# Patient Record
Sex: Male | Born: 1960 | Race: Black or African American | Hispanic: No | Smoking: Former smoker
Health system: Southern US, Community
[De-identification: ages and names within clinical notes are randomized; demographics above are authoritative.]

## PROBLEM LIST (undated history)

## (undated) DIAGNOSIS — J329 Chronic sinusitis, unspecified: Secondary | ICD-10-CM

## (undated) DIAGNOSIS — E119 Type 2 diabetes mellitus without complications: Secondary | ICD-10-CM

## (undated) DIAGNOSIS — I10 Essential (primary) hypertension: Secondary | ICD-10-CM

## (undated) DIAGNOSIS — E291 Testicular hypofunction: Secondary | ICD-10-CM

## (undated) DIAGNOSIS — J309 Allergic rhinitis, unspecified: Secondary | ICD-10-CM

## (undated) DIAGNOSIS — T7840XA Allergy, unspecified, initial encounter: Secondary | ICD-10-CM

## (undated) DIAGNOSIS — E785 Hyperlipidemia, unspecified: Secondary | ICD-10-CM

## (undated) DIAGNOSIS — S37019A Minor contusion of unspecified kidney, initial encounter: Secondary | ICD-10-CM

## (undated) HISTORY — DX: Essential (primary) hypertension: I10

## (undated) HISTORY — PX: WISDOM TOOTH EXTRACTION: SHX21

## (undated) HISTORY — DX: Chronic sinusitis, unspecified: J32.9

## (undated) HISTORY — PX: ROTATOR CUFF REPAIR: SHX139

## (undated) HISTORY — DX: Minor contusion of unspecified kidney, initial encounter: S37.019A

## (undated) HISTORY — DX: Testicular hypofunction: E29.1

## (undated) HISTORY — PX: HERNIA REPAIR: SHX51

## (undated) HISTORY — DX: Hyperlipidemia, unspecified: E78.5

## (undated) HISTORY — DX: Allergy, unspecified, initial encounter: T78.40XA

## (undated) HISTORY — PX: COLONOSCOPY: SHX174

## (undated) HISTORY — DX: Type 2 diabetes mellitus without complications: E11.9

## (undated) HISTORY — DX: Allergic rhinitis, unspecified: J30.9

---

## 1999-09-08 ENCOUNTER — Encounter: Payer: Self-pay | Admitting: Internal Medicine

## 1999-09-08 ENCOUNTER — Ambulatory Visit (HOSPITAL_COMMUNITY): Admission: RE | Admit: 1999-09-08 | Discharge: 1999-09-08 | Payer: Self-pay | Admitting: Family Medicine

## 2001-10-16 ENCOUNTER — Encounter: Payer: Self-pay | Admitting: Internal Medicine

## 2001-10-16 ENCOUNTER — Ambulatory Visit (HOSPITAL_COMMUNITY): Admission: RE | Admit: 2001-10-16 | Discharge: 2001-10-16 | Payer: Self-pay | Admitting: Internal Medicine

## 2004-02-28 ENCOUNTER — Ambulatory Visit (HOSPITAL_COMMUNITY): Admission: RE | Admit: 2004-02-28 | Discharge: 2004-02-28 | Payer: Self-pay | Admitting: Internal Medicine

## 2006-05-10 LAB — HM COLONOSCOPY

## 2008-02-11 ENCOUNTER — Ambulatory Visit (HOSPITAL_COMMUNITY): Admission: RE | Admit: 2008-02-11 | Discharge: 2008-02-11 | Payer: Self-pay | Admitting: Internal Medicine

## 2010-03-09 ENCOUNTER — Ambulatory Visit (HOSPITAL_COMMUNITY): Admission: RE | Admit: 2010-03-09 | Discharge: 2010-03-09 | Payer: Self-pay | Admitting: Internal Medicine

## 2010-03-16 ENCOUNTER — Encounter: Admission: RE | Admit: 2010-03-16 | Discharge: 2010-03-16 | Payer: Self-pay | Admitting: Internal Medicine

## 2010-05-04 ENCOUNTER — Ambulatory Visit (HOSPITAL_COMMUNITY): Admission: RE | Admit: 2010-05-04 | Discharge: 2010-05-04 | Payer: Self-pay | Admitting: Internal Medicine

## 2011-11-21 ENCOUNTER — Other Ambulatory Visit (HOSPITAL_COMMUNITY): Payer: Self-pay | Admitting: Internal Medicine

## 2011-11-21 ENCOUNTER — Ambulatory Visit (HOSPITAL_COMMUNITY)
Admission: RE | Admit: 2011-11-21 | Discharge: 2011-11-21 | Disposition: A | Discharge status: Home / Self Care | Payer: BC Managed Care – PPO | Source: Ambulatory Visit | Attending: Internal Medicine | Admitting: Internal Medicine

## 2011-11-21 DIAGNOSIS — R509 Fever, unspecified: Secondary | ICD-10-CM

## 2011-11-21 DIAGNOSIS — I10 Essential (primary) hypertension: Secondary | ICD-10-CM | POA: Insufficient documentation

## 2011-11-21 DIAGNOSIS — R05 Cough: Secondary | ICD-10-CM | POA: Insufficient documentation

## 2011-11-21 DIAGNOSIS — R059 Cough, unspecified: Secondary | ICD-10-CM | POA: Insufficient documentation

## 2012-10-08 ENCOUNTER — Encounter: Payer: Self-pay | Admitting: *Deleted

## 2012-10-09 ENCOUNTER — Ambulatory Visit (INDEPENDENT_AMBULATORY_CARE_PROVIDER_SITE_OTHER): Payer: BC Managed Care – PPO | Admitting: Internal Medicine

## 2012-10-09 ENCOUNTER — Encounter: Payer: Self-pay | Admitting: Internal Medicine

## 2012-10-09 ENCOUNTER — Other Ambulatory Visit: Payer: BC Managed Care – PPO

## 2012-10-09 VITALS — BP 110/74 | HR 71 | Ht 70.0 in | Wt 259.0 lb

## 2012-10-09 DIAGNOSIS — J309 Allergic rhinitis, unspecified: Secondary | ICD-10-CM

## 2012-10-09 DIAGNOSIS — G4733 Obstructive sleep apnea (adult) (pediatric): Secondary | ICD-10-CM

## 2012-10-09 NOTE — Patient Instructions (Addendum)
Order - lab- Allergy profile  Dx allergic rhinitis  Sample Dymista nasal spray-  1 or 2 puffs each nostril once daily at bedtime. Try this instead of Omnaris for a while for comparison  Ask your wife to notice if, with the snoring, you also seem to slow down your breathing or hold your breath, which might indicate sleep apnea

## 2012-10-09 NOTE — Progress Notes (Signed)
10/09/12- 51 yoM former smoker referred for allergy consultation, courtesy of Dr Oneta Rack He had been referred to ENT, Dr Narda Bonds, who prescribed Omnaris, Neti pot and suggested Allegra.. has had eustachian dysfunction for many years. Perennial nasal congestion and postnasal drainage. Occasionally nose burns and he will sneeze. Uses Afrin at bedtime. ENT surgery only for wisdom teeth. Eating and temperature changes cause watery rhinorrhea and salivation. Loud snoring. Feels better when he is closer to the beach. Lots of sinus congestion and drainage. Sputum is clear in color. Quit smoking 2008. 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  Prior to Admission medications   Medication Sig Start Date End Date Taking? Authorizing Provider  aspirin 81 MG tablet Take 81 mg by mouth daily.   Yes Historical Provider, MD  bisoprolol-hydrochlorothiazide (ZIAC) 5-6.25 MG per tablet Take 1 tablet by mouth daily.   Yes Historical Provider, MD  ezetimibe (ZETIA) 10 MG tablet Take 10 mg by mouth daily.   Yes Historical Provider, MD  glipiZIDE (GLUCOTROL) 5 MG tablet Take 5 mg by mouth 2 (two) times daily before a meal.   Yes Historical Provider, MD  insulin detemir (LEVEMIR) 100 UNIT/ML injection Inject 15 Units into the skin at bedtime.   Yes Historical Provider, MD  metFORMIN (GLUCOPHAGE) 500 MG tablet Take 1,000 mg by mouth 2 (two) times daily with a meal.   Yes Historical Provider, MD   Earl Lagos Past Surgical History  Procedure Date  . Wisdom tooth extraction   . Hernia repair    Family History  Problem Relation Age of Onset  . Diabetes Mother   . Hypertension Mother   . Hyperlipidemia Mother   . Diabetes Father   . Hypertension Father   . Hyperlipidemia Father    History   Social History  . Marital Status: Divorced    Spouse Name: N/A    Number of Children: N/A  . Years of Education: N/A   Occupational History  . Not on file.   Social History Main  Topics  . Smoking status: Former Smoker -- 0.3 packs/day for 8 years    Types: Cigarettes  . Smokeless tobacco: Not on file  . Alcohol Use: Yes     Comment: Special Occassions  . Drug Use: No  . Sexually Active: Not on file   Other Topics Concern  . Not on file   Social History Narrative  . No narrative on file   ROS-see HPI Constitutional:   No-   weight loss, night sweats, fevers, chills, fatigue, lassitude. HEENT:   No-  headaches, difficulty swallowing, tooth/dental problems, sore throat,       +  sneezing, itching, ear ache, +nasal congestion, post nasal drip,  CV:  No-   chest pain, orthopnea, PND, swelling in lower extremities, anasarca, dizziness, palpitations Resp: No-   shortness of breath with exertion or at rest.              No-   productive cough,  No non-productive cough,  No- coughing up of blood.              No-   change in color of mucus.  No- wheezing.   Skin: No-   rash or lesions. GI:  No-   heartburn, indigestion, abdominal pain, nausea, vomiting, diarrhea,                 change in bowel habits, loss of appetite GU: No-   dysuria, change  in color of urine, no urgency or frequency.  No- flank pain. MS:  No-   joint pain or swelling.  No- decreased range of motion.  No- back pain. Neuro-     nothing unusual Psych:  No- change in mood or affect. No depression or anxiety.  No memory loss.  OBJ- Physical Exam General- Alert, Oriented, Affect-appropriate, Distress- none acute Skin- rash-none, lesions- none, excoriation- none Lymphadenopathy- none Head- atraumatic            Eyes- Gross vision intact, PERRLA, conjunctivae and secretions clear            Ears- Hearing, canals-normal            Nose- + mucus, no-Septal dev,  polyps, erosion, perforation             Throat- Mallampati II-III , mucosa clear , drainage- none, tonsils- atrophic Neck- flexible , trachea midline, no stridor , thyroid nl, carotid no bruit Chest - symmetrical excursion , unlabored            Heart/CV- RRR , no murmur , no gallop  , no rub, nl s1 s2                           - JVD- none , edema- none, stasis changes- none, varices- none           Lung- clear to P&A, wheeze- none, cough- none , dullness-none, rub- none           Chest wall-  Abd- tender-no, distended-no, bowel sounds-present, HSM- no Br/ Gen/ Rectal- Not done, not indicated Extrem- cyanosis- none, clubbing, none, atrophy- none, strength- nl Neuro- grossly intact to observation

## 2012-10-10 LAB — ALLERGY FULL PROFILE
Allergen, D pternoyssinus,d7: <0.1 kU/L
Allergen,Goose feathers, e70: <0.1 kU/L
Alternaria Alternata: <0.1 kU/L
Aspergillus fumigatus, IgG: <0.1 kU/L
Bahia Grass: <0.1 kU/L
Bermuda Grass: <0.1 kU/L
Box Elder IgE: <0.1 kU/L
Candida Albicans: <0.1 kU/L
Cat Dander: <0.1 kU/L
Common Ragweed: <0.1 kU/L
Curvularia lunata: <0.1 kU/L
D. farinae: <0.1 kU/L
Dog Dander: <0.1 kU/L
Elm IgE: <0.1 kU/L
Fescue: <0.1 kU/L
G005 Rye, Perennial: <0.1 kU/L
G009 Red Top: <0.1 kU/L
Goldenrod: <0.1 kU/L
Helminthosporium halodes: <0.1 kU/L
House Dust Hollister: <0.1 kU/L
IgE (Immunoglobulin E), Serum: 16.5 IU/mL (ref 0.0–180.0)
Lamb's Quarters: <0.1 kU/L
Oak: <0.1 kU/L
Plantain: <0.1 kU/L
Stemphylium Botryosum: <0.1 kU/L
Sycamore Tree: <0.1 kU/L
Timothy Grass: <0.1 kU/L

## 2012-10-19 DIAGNOSIS — J310 Chronic rhinitis: Secondary | ICD-10-CM | POA: Insufficient documentation

## 2012-10-19 DIAGNOSIS — G4733 Obstructive sleep apnea (adult) (pediatric): Secondary | ICD-10-CM | POA: Insufficient documentation

## 2012-10-19 NOTE — Assessment & Plan Note (Addendum)
Not clear if this is allergic or nonallergic/vasomotor Plan-allergy profile, sample of Dymista nasal spray

## 2012-10-19 NOTE — Assessment & Plan Note (Signed)
Family reports loud snoring. He feels daytime sleepiness only if sugar is low. Plan-he is going to discuss with his wife and asked her to a more attention to whether he is stopping breathing before we consider scheduling a sleep study.

## 2012-10-23 NOTE — Progress Notes (Signed)
Quick Note:  LMTCB ______ 

## 2012-11-05 NOTE — Progress Notes (Signed)
Quick Note:  Called pt's home # - spoke with family member. Was advised pt is not home right now. She will ask pt to call office back. ______

## 2012-11-10 ENCOUNTER — Ambulatory Visit (INDEPENDENT_AMBULATORY_CARE_PROVIDER_SITE_OTHER): Payer: BC Managed Care – PPO | Admitting: Internal Medicine

## 2012-11-10 ENCOUNTER — Encounter: Payer: Self-pay | Admitting: Internal Medicine

## 2012-11-10 VITALS — BP 124/70 | HR 70 | Ht 70.0 in | Wt 259.0 lb

## 2012-11-10 DIAGNOSIS — J310 Chronic rhinitis: Secondary | ICD-10-CM

## 2012-11-10 DIAGNOSIS — G4733 Obstructive sleep apnea (adult) (pediatric): Secondary | ICD-10-CM

## 2012-11-10 MED ORDER — AZELASTINE-FLUTICASONE 137-50 MCG/ACT NA SUSP: 1.0000 | Freq: Every day | NASAL | Status: DC

## 2012-11-10 MED ORDER — AZELASTINE-FLUTICASONE 137-50 MCG/ACT NA SUSP: 2.0000 | Freq: Every day | NASAL | Status: DC

## 2012-11-10 NOTE — Progress Notes (Signed)
10/09/12- 51 yoM former smoker referred for allergy consultation, courtesy of Dr Oneta Rack He had been referred to ENT, Dr Narda Bonds, who prescribed Omnaris, Neti pot and suggested Allegra.. has had eustachian dysfunction for many years. Perennial nasal congestion and postnasal drainage. Occasionally nose burns and he will sneeze. Uses Afrin at bedtime. ENT surgery only for wisdom teeth. Eating and temperature changes cause watery rhinorrhea and salivation. Loud snoring. Feels better when he is closer to the beach. Lots of sinus congestion and drainage. Sputum is clear in color. Quit smoking 2008. 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  11/10/12- 51 yoM former smoker referred for allergic rhinitis       PCP Dr Oneta Rack FOLLOWS FOR: used Dymista and had good relief from it. After he ran out of medication he continued to have "swelling" in his sinuses. Wife is here and says husband is a loud snorer with witnessed apnea. He falls asleep watching TV. We discussed sleep apnea. Dymista nasal spray helps nose but did not help his snoring. He elevates the head with pillows to control reflux. Previously tried on Omnaris and Nasonex. Allergy Profile 09/09/2012-total IgE 16.5, no specific elevations  ROS-see HPI Constitutional:   No-   weight loss, night sweats, fevers, chills, fatigue, lassitude. HEENT:   No-  headaches, difficulty swallowing, tooth/dental problems, sore throat,       +  sneezing, itching, ear ache, +nasal congestion, post nasal drip,  CV:  No-   chest pain, orthopnea, PND, swelling in lower extremities, anasarca, dizziness, palpitations Resp: No-   shortness of breath with exertion or at rest.              No-   productive cough,  No non-productive cough,  No- coughing up of blood.              No-   change in color of mucus.  No- wheezing.   Skin: No-   rash or lesions. GI:  No-   heartburn, indigestion, abdominal pain, nausea,  GU:. MS:  No-    joint pain or swelling.   Neuro-     nothing unusual Psych:  No- change in mood or affect. No depression or anxiety.  No memory loss.  OBJ- Physical Exam General- Alert, Oriented, Affect-appropriate, Distress- none acute Skin- rash-none, lesions- none, excoriation- none Lymphadenopathy- none Head- atraumatic            Eyes- Gross vision intact, PERRLA, conjunctivae and secretions clear            Ears- Hearing, canals-normal            Nose- + mucus bilaterally, no-Septal dev,  polyps, erosion, perforation             Throat- Mallampati II-III , mucosa clear , drainage- none, tonsils- atrophic Neck- flexible , trachea midline, no stridor , thyroid nl, carotid no bruit Chest - symmetrical excursion , unlabored           Heart/CV- RRR , no murmur , no gallop  , no rub, nl s1 s2                           - JVD- none , edema- none, stasis changes- none, varices- none           Lung- clear to P&A, wheeze- none, cough- none , dullness-none, rub- none  Chest wall-  Abd-  Br/ Gen/ Rectal- Not done, not indicated Extrem- cyanosis- none, clubbing, none, atrophy- none, strength- nl Neuro- grossly intact to observation

## 2012-11-10 NOTE — Patient Instructions (Addendum)
Script for Dymista nasal spray    1-2 puffs each nostril once daily at bedtime     If insurance won't cover this, let us know.   [facility]  [facility]ended sleep [facility]   Dx OSA

## 2012-11-19 NOTE — Progress Notes (Signed)
Quick Note:  Spoke with pt and notified of results per Dr. Young. Pt verbalized understanding and denied any questions.  ______ 

## 2012-11-20 NOTE — Assessment & Plan Note (Signed)
There are probably vasomotor and infection components. With associated eustachian dysfunction. Plan-treat symptomatically. Encouraged scarf over face in very cold winter weather. Dymista

## 2012-11-20 NOTE — Assessment & Plan Note (Signed)
Symptoms and body habitus suggests high probability of obstructive sleep apnea. Plan-schedule unattended home sleep study.

## 2012-12-26 ENCOUNTER — Encounter: Payer: Self-pay | Admitting: Internal Medicine

## 2012-12-26 ENCOUNTER — Encounter: Payer: Self-pay | Admitting: *Deleted

## 2012-12-26 ENCOUNTER — Ambulatory Visit (INDEPENDENT_AMBULATORY_CARE_PROVIDER_SITE_OTHER): Payer: BC Managed Care – PPO | Admitting: Internal Medicine

## 2012-12-26 VITALS — BP 120/70 | HR 73 | Ht 70.0 in | Wt 262.8 lb

## 2012-12-26 DIAGNOSIS — J310 Chronic rhinitis: Secondary | ICD-10-CM

## 2012-12-26 DIAGNOSIS — G4733 Obstructive sleep apnea (adult) (pediatric): Secondary | ICD-10-CM

## 2012-12-26 NOTE — Progress Notes (Signed)
10/09/12- 51 yoM former smoker referred for allergy consultation, courtesy of Dr Oneta Rack He had been referred to ENT, Dr Narda Bonds, who prescribed Omnaris, Neti pot and suggested Allegra.. has had eustachian dysfunction for many years. Perennial nasal congestion and postnasal drainage. Occasionally nose burns and he will sneeze. Uses Afrin at bedtime. ENT surgery only for wisdom teeth. Eating and temperature changes cause watery rhinorrhea and salivation. Loud snoring. Feels better when he is closer to the beach. Lots of sinus congestion and drainage. Sputum is clear in color. Quit smoking 2008. 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  11/10/12- 51 yoM former smoker referred for allergic rhinitis       PCP Dr Oneta Rack FOLLOWS FOR: used Dymista and had good relief from it. After he ran out of medication he continued to have "swelling" in his sinuses. Wife is here and says husband is a loud snorer with witnessed apnea. He falls asleep watching TV. We discussed sleep apnea. Dymista nasal spray helps nose but did not help his snoring. He elevates the head with pillows to control reflux. Previously tried on Omnaris and Nasonex. Allergy Profile 09/09/2012-total IgE 16.5, no specific elevations  12/26/12 51 yoM former smoker referred for allergic rhinitis, OSA       PCP Dr Oneta Rack FOLLOWS FOR: Never had Home sleep study due to never being called  about getting machine from our office. Dymista nasal spray he does work for his nasal congestion but he doesn't like the taste. Allergy Profile- 10/09/2012-negative. Total IgE 16.5 with no specific allergen elevations.  ROS-see HPI Constitutional:   No-   weight loss, night sweats, fevers, chills, +fatigue, lassitude. HEENT:   No-  headaches, difficulty swallowing, tooth/dental problems, sore throat,       +  sneezing, itching, ear ache, +nasal congestion, post nasal drip,  CV:  No-   chest pain, orthopnea, PND, swelling  in lower extremities, anasarca, dizziness, palpitations Resp: No-   shortness of breath with exertion or at rest.              No-   productive cough,  No non-productive cough,  No- coughing up of blood.              No-   change in color of mucus.  No- wheezing.   Skin: No-   rash or lesions. GI:  No-   heartburn, indigestion, abdominal pain, nausea,  GU:. MS:  No-   joint pain or swelling.   Neuro-     nothing unusual Psych:  No- change in mood or affect. No depression or anxiety.  No memory loss.  OBJ- Physical Exam General- Alert, Oriented, Affect-appropriate, Distress- none acute Skin- rash-none, lesions- none, excoriation- none Lymphadenopathy- none Head- atraumatic            Eyes- Gross vision intact, PERRLA, conjunctivae and secretions clear            Ears- Hearing, canals-normal            Nose- + mucus bilaterally,+ snorting and sniffling no-Septal dev,  polyps, erosion, perforation             Throat- Mallampati II-III , mucosa clear , drainage- none, tonsils- atrophic Neck- flexible , trachea midline, no stridor , thyroid nl, carotid no bruit Chest - symmetrical excursion , unlabored           Heart/CV- RRR , no murmur , no gallop  , no rub, nl s1  s2                           - JVD- none , edema- none, stasis changes- none, varices- none           Lung- clear to P&A, wheeze- none, cough- none , dullness-none, rub- none           Chest wall-  Abd-  Br/ Gen/ Rectal- Not done, not indicated Extrem- cyanosis- none, clubbing, none, atrophy- none, strength- nl Neuro- grossly intact to observation

## 2012-12-26 NOTE — Patient Instructions (Addendum)
Order- Schedule [name] sleep study for dx OSA  Ok to continue Dymista 1-2 puffs each nostril, once or twice daily when needed  As an alternative, you might try otc nasal spray Nasalcrom/ Cromolyn- follow directions on the package

## 2013-01-06 ENCOUNTER — Telehealth: Payer: Self-pay | Admitting: Internal Medicine

## 2013-01-06 NOTE — Telephone Encounter (Signed)
Spoke to pt he is aware he is to come on wed 01/08/13@3 :00pm to get set up for home sleep test [name]

## 2013-01-07 DIAGNOSIS — G4733 Obstructive sleep apnea (adult) (pediatric): Secondary | ICD-10-CM

## 2013-01-09 NOTE — Assessment & Plan Note (Signed)
I continue to think this diagnosis is likely. Plan-reschedule for unattended sleep study as discussed with him.

## 2013-01-09 NOTE — Assessment & Plan Note (Signed)
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 Is likely complicates his airway patency during sleep. We discussed options and will first try Nasalcrom nasal spray. Ipratropium would be my next choice.

## 2013-02-02 ENCOUNTER — Encounter: Payer: Self-pay | Admitting: Internal Medicine

## 2013-02-06 ENCOUNTER — Encounter: Payer: Self-pay | Admitting: *Deleted

## 2013-02-06 ENCOUNTER — Ambulatory Visit (INDEPENDENT_AMBULATORY_CARE_PROVIDER_SITE_OTHER): Payer: BC Managed Care – PPO | Admitting: Internal Medicine

## 2013-02-06 ENCOUNTER — Encounter: Payer: Self-pay | Admitting: Internal Medicine

## 2013-02-06 VITALS — BP 128/74 | HR 72 | Ht 70.0 in | Wt 253.2 lb

## 2013-02-06 DIAGNOSIS — G4733 Obstructive sleep apnea (adult) (pediatric): Secondary | ICD-10-CM

## 2013-02-06 DIAGNOSIS — J310 Chronic rhinitis: Secondary | ICD-10-CM

## 2013-02-06 NOTE — Assessment & Plan Note (Signed)
Nasalcrom has been some help He will also try nasal saline gel

## 2013-02-06 NOTE — Patient Instructions (Addendum)
Ok to continue Nasalcrom nasal spray  Consider trying an otc nasal saline gel as needed for dryness or irritability   [facility] 5-20 cwp x 7 days for pressure recommendation, mask of choice, humidifier, supplies    Dx OSA  Booklet on sleep apnea

## 2013-02-06 NOTE — Progress Notes (Signed)
10/09/12- 51 yoM former smoker referred for allergy consultation, courtesy of Dr Oneta Rack He had been referred to ENT, Dr Narda Bonds, who prescribed Omnaris, Neti pot and suggested Allegra.. has had eustachian dysfunction for many years. Perennial nasal congestion and postnasal drainage. Occasionally nose burns and he will sneeze. Uses Afrin at bedtime. ENT surgery only for wisdom teeth. Eating and temperature changes cause watery rhinorrhea and salivation. Loud snoring. Feels better when he is closer to the beach. Lots of sinus congestion and drainage. Sputum is clear in color. Quit smoking 2008. 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  11/10/12- 51 yoM former smoker referred for allergic rhinitis       PCP Dr Oneta Rack FOLLOWS FOR: used Dymista and had good relief from it. After he ran out of medication he continued to have "swelling" in his sinuses. Wife is here and says husband is a loud snorer with witnessed apnea. He falls asleep watching TV. We discussed sleep apnea. Dymista nasal spray helps nose but did not help his snoring. He elevates the head with pillows to control reflux. Previously tried on Omnaris and Nasonex. Allergy Profile 09/09/2012-total IgE 16.5, no specific elevations  12/26/12 51 yoM former smoker referred for allergic rhinitis, OSA       PCP Dr Oneta Rack FOLLOWS FOR: Never had Home sleep study due to never being called  about getting machine from our office. Dymista nasal spray  does work for his nasal congestion but he doesn't like the taste. Allergy Profile- 10/09/2012-negative. Total IgE 16.5 with no specific allergen elevations.  02/06/13- 51 yoM former smoker referred for allergic rhinitis, OSA       PCP Dr Oneta Rack FOLLOWS FOR: review home sleep study with patient. Doing welll with Nasalcrom nasal spray Unattended Home Sleep Study 01/07/13- severe OSA AHI 35.3/ hr We reviewed the study, medical implications and treatment  options. Nasalcrom helps his nose some. Prefers to sleep on stomach.  He is willing to try CPAP- to start w/ autotitration.   ROS-see HPI Constitutional:   No-   weight loss, night sweats, fevers, chills, +fatigue, lassitude. HEENT:   No-  headaches, difficulty swallowing, tooth/dental problems, sore throat,       +  sneezing, itching, ear ache, +nasal congestion, post nasal drip,  CV:  No-   chest pain, orthopnea, PND, swelling in lower extremities, anasarca, dizziness, palpitations Resp: No-   shortness of breath with exertion or at rest.              No-   productive cough,  No non-productive cough,  No- coughing up of blood.              No-   change in color of mucus.  No- wheezing.   Skin: No-   rash or lesions. GI:  No-   heartburn, indigestion, abdominal pain, nausea,  GU:. MS:  No-   joint pain or swelling.   Neuro-     nothing unusual Psych:  No- change in mood or affect. No depression or anxiety.  No memory loss.  OBJ- Physical Exam General- Alert, Oriented, Affect-appropriate, Distress- none acute Skin- rash-none, lesions- none, excoriation- none Lymphadenopathy- none Head- atraumatic            Eyes- Gross vision intact, PERRLA, conjunctivae and secretions clear            Ears- Hearing, canals-normal            Nose- + mucus bilaterally,+  snorting and sniffling no-Septal dev,  polyps, erosion, perforation             Throat- Mallampati II-III , mucosa clear , drainage- none, tonsils- atrophic Neck- flexible , trachea midline, no stridor , thyroid nl, carotid no bruit Chest - symmetrical excursion , unlabored           Heart/CV- RRR , no murmur , no gallop  , no rub, nl s1 s2                           - JVD- none , edema- none, stasis changes- none, varices- none           Lung- clear to P&A, wheeze- none, cough- none , dullness-none, rub- none           Chest wall-  Abd-  Br/ Gen/ Rectal- Not done, not indicated Extrem- cyanosis- none, clubbing, none, atrophy- none,  strength- nl Neuro- grossly intact to observation

## 2013-02-06 NOTE — Assessment & Plan Note (Addendum)
Plan- Start w/ CPAP autotitration for pressure recommendation. Oral appliances may be an alternative.

## 2013-03-20 ENCOUNTER — Encounter: Payer: Self-pay | Admitting: Internal Medicine

## 2013-03-20 ENCOUNTER — Ambulatory Visit (INDEPENDENT_AMBULATORY_CARE_PROVIDER_SITE_OTHER): Payer: BC Managed Care – PPO | Admitting: Internal Medicine

## 2013-03-20 ENCOUNTER — Encounter: Payer: Self-pay | Admitting: *Deleted

## 2013-03-20 VITALS — BP 122/82 | HR 66 | Ht 69.0 in | Wt 251.8 lb

## 2013-03-20 DIAGNOSIS — G4733 Obstructive sleep apnea (adult) (pediatric): Secondary | ICD-10-CM

## 2013-03-20 DIAGNOSIS — J310 Chronic rhinitis: Secondary | ICD-10-CM

## 2013-03-20 MED ORDER — AZELASTINE-FLUTICASONE 137-50 MCG/ACT NA SUSP: 1.0000 | Freq: Every day | NASAL | Status: DC

## 2013-03-20 NOTE — Progress Notes (Signed)
10/09/12- 51 yoM former smoker referred for allergy consultation, courtesy of Dr Oneta Rack He had been referred to ENT, Dr Narda Bonds, who prescribed Omnaris, Neti pot and suggested Allegra.. has had eustachian dysfunction for many years. Perennial nasal congestion and postnasal drainage. Occasionally nose burns and he will sneeze. Uses Afrin at bedtime. ENT surgery only for wisdom teeth. Eating and temperature changes cause watery rhinorrhea and salivation. Loud snoring. Feels better when he is closer to the beach. Lots of sinus congestion and drainage. Sputum is clear in color. Quit smoking 2008. 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  11/10/12- 51 yoM former smoker referred for allergic rhinitis       PCP Dr Oneta Rack FOLLOWS FOR: used Dymista and had good relief from it. After he ran out of medication he continued to have "swelling" in his sinuses. Wife is here and says husband is a loud snorer with witnessed apnea. He falls asleep watching TV. We discussed sleep apnea. Dymista nasal spray helps nose but did not help his snoring. He elevates the head with pillows to control reflux. Previously tried on Omnaris and Nasonex. Allergy Profile 09/09/2012-total IgE 16.5, no specific elevations  12/26/12 51 yoM former smoker referred for allergic rhinitis, OSA       PCP Dr Oneta Rack FOLLOWS FOR: Never had Home sleep study due to never being called  about getting machine from our office. Dymista nasal spray  does work for his nasal congestion but he doesn't like the taste. Allergy Profile- 10/09/2012-negative. Total IgE 16.5 with no specific allergen elevations.  02/06/13- 51 yoM former smoker referred for allergic rhinitis, OSA       PCP Dr Oneta Rack FOLLOWS FOR: review home sleep study with patient. Doing welll with Nasalcrom nasal spray Unattended Home Sleep Study 01/07/13- severe OSA AHI 35.3/ hr We reviewed the study, medical implications and treatment  options. Nasalcrom helps his nose some. Prefers to sleep on stomach.  He is willing to try CPAP- to start w/ autotitration.   03/20/13- 51 yoM former smoker referred for allergic rhinitis, OSA       PCP Dr Oneta Rack FOLLOWS [id number] use to CPAP -will get download from [facility] once patient is at home to help with download. Feels better but only sleeping 4-5 hours a night. He feels he is getting used to CPAP. He wakes after sleeping for 5 hours which is his traditional pattern, and that he is getting enough sleep. No longer snores. Seasonal pollen rhinitis for the past week, using Claritin and occasional Afrin. He also uses Omnaris nasal spray each morning and Dymista nasal spray at night. I discussed this.  ROS-see HPI Constitutional:   No-   weight loss, night sweats, fevers, chills, +fatigue, lassitude. HEENT:   No-  headaches, difficulty swallowing, tooth/dental problems, sore throat,       +  sneezing, itching, ear ache, +nasal congestion, post nasal drip,  CV:  No-   chest pain, orthopnea, PND, swelling in lower extremities, anasarca, dizziness, palpitations Resp: No-   shortness of breath with exertion or at rest.              No-   productive cough,  No non-productive cough,  No- coughing up of blood.              No-   change in color of mucus.  No- wheezing.   Skin: No-   rash or lesions. GI:  No-   heartburn, indigestion, abdominal pain,  nausea,  GU:. MS:  No-   joint pain or swelling.   Neuro-     nothing unusual Psych:  No- change in mood or affect. No depression or anxiety.  No memory loss.  OBJ- Physical Exam General- Alert, Oriented, Affect-appropriate, Distress- none acute Skin- rash-none, lesions- none, excoriation- none Lymphadenopathy- none Head- atraumatic            Eyes- Gross vision intact, PERRLA, conjunctivae and secretions clear            Ears- Hearing, canals-normal            Nose- clear, no-Septal dev,  polyps, erosion, perforation             Throat-  Mallampati II-III , mucosa clear , drainage- none, tonsils- atrophic Neck- flexible , trachea midline, no stridor , thyroid nl, carotid no bruit Chest - symmetrical excursion , unlabored           Heart/CV- RRR , no murmur , no gallop  , no rub, nl s1 s2                           - JVD- none , edema- none, stasis changes- none, varices- none           Lung- clear to P&A, wheeze- none, cough- none , dullness-none, rub- none           Chest wall-  Abd-  Br/ Gen/ Rectal- Not done, not indicated Extrem- cyanosis- none, clubbing, none, atrophy- none, strength- nl Neuro- grossly intact to observation

## 2013-03-20 NOTE — Patient Instructions (Addendum)
We will look at adjusting CPAP pressure after I get the download result. If we make a change you don't like, please let me know.

## 2013-03-29 NOTE — Assessment & Plan Note (Signed)
He will stay on AutoPap so we get the download from his home care company, [facility]

## 2013-03-29 NOTE — Assessment & Plan Note (Signed)
Symptoms flare consistent with spring pollen season despite negative allergy profile. He doesn't need to be using 2 different brands of nasal steroid spray, as discussed.

## 2013-05-08 ENCOUNTER — Ambulatory Visit (INDEPENDENT_AMBULATORY_CARE_PROVIDER_SITE_OTHER): Payer: BC Managed Care – PPO | Admitting: Internal Medicine

## 2013-05-08 ENCOUNTER — Encounter: Payer: Self-pay | Admitting: Internal Medicine

## 2013-05-08 VITALS — BP 118/64 | HR 63 | Ht 69.0 in | Wt 250.2 lb

## 2013-05-08 DIAGNOSIS — G4733 Obstructive sleep apnea (adult) (pediatric): Secondary | ICD-10-CM

## 2013-05-08 DIAGNOSIS — J310 Chronic rhinitis: Secondary | ICD-10-CM

## 2013-05-08 NOTE — Patient Instructions (Addendum)
We can continue CPAP AutoPap with [facility] company  Please call as needed

## 2013-05-08 NOTE — Progress Notes (Signed)
10/09/12- 51 yoM former smoker referred for allergy consultation, courtesy of Dr Oneta Rack He had been referred to ENT, Dr Narda Bonds, who prescribed Omnaris, Neti pot and suggested Allegra.. has had eustachian dysfunction for many years. Perennial nasal congestion and postnasal drainage. Occasionally nose burns and he will sneeze. Uses Afrin at bedtime. ENT surgery only for wisdom teeth. Eating and temperature changes cause watery rhinorrhea and salivation. Loud snoring. Feels better when he is closer to the beach. Lots of sinus congestion and drainage. Sputum is clear in color. Quit smoking 2008. 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  11/10/12- 51 yoM former smoker referred for allergic rhinitis       PCP Dr Oneta Rack FOLLOWS FOR: used Dymista and had good relief from it. After he ran out of medication he continued to have "swelling" in his sinuses. Wife is here and says husband is a loud snorer with witnessed apnea. He falls asleep watching TV. We discussed sleep apnea. Dymista nasal spray helps nose but did not help his snoring. He elevates the head with pillows to control reflux. Previously tried on Omnaris and Nasonex. Allergy Profile 09/09/2012-total IgE 16.5, no specific elevations  12/26/12 51 yoM former smoker referred for allergic rhinitis, OSA       PCP Dr Oneta Rack FOLLOWS FOR: Never had Home sleep study due to never being called  about getting machine from our office. Dymista nasal spray  does work for his nasal congestion but he doesn't like the taste. Allergy Profile- 10/09/2012-negative. Total IgE 16.5 with no specific allergen elevations.  02/06/13- 51 yoM former smoker referred for allergic rhinitis, OSA       PCP Dr Oneta Rack FOLLOWS FOR: review home sleep study with patient. Doing welll with Nasalcrom nasal spray Unattended Home Sleep Study 01/07/13- severe OSA AHI 35.3/ hr We reviewed the study, medical implications and treatment  options. Nasalcrom helps his nose some. Prefers to sleep on stomach.  He is willing to try CPAP- to start w/ autotitration.   03/20/13- 51 yoM former smoker referred for allergic rhinitis, OSA       PCP Dr Oneta Rack FOLLOWS [id number] use to CPAP -will get download from [facility] once patient is at home to help with download. Feels better but only sleeping 4-5 hours a night. He feels he is getting used to CPAP. He wakes after sleeping for 5 hours which is his traditional pattern, and that he is getting enough sleep. No longer snores. Seasonal pollen rhinitis for the past week, using Claritin and occasional Afrin. He also uses Omnaris nasal spray each morning and Dymista nasal spray at night. I discussed this.  05/08/13-  51 yoM former smoker referred for allergic rhinitis, OSA       PCP Dr Oneta Rack FOLLOWS FOR: wears CPAP/ Auto[facility]ap [facility] every night for about 4 -6 hours; pressure works well for patient. Download indicates he is not using it does not each night. Pressure of 9 would give good control. We discussed compliance and comfort. He indicates he is doing much better now. His wife has told him he breathes comfortably without snoring using CPAP  ROS-see HPI Constitutional:   No-   weight loss, night sweats, fevers, chills, +fatigue, lassitude. HEENT:   No-  headaches, difficulty swallowing, tooth/dental problems, sore throat,       No-  sneezing, itching, ear ache, +nasal congestion, post nasal drip,  CV:  No-   chest pain, orthopnea, PND, swelling in lower extremities, anasarca, dizziness, palpitations  Resp: No-   shortness of breath with exertion or at rest.              No-   productive cough,  No non-productive cough,  No- coughing up of blood.              No-   change in color of mucus.  No- wheezing.   Skin: No-   rash or lesions. GI:  No-   heartburn, indigestion, abdominal pain, nausea,  GU:. MS:  No-   joint pain or swelling.   Neuro-     nothing unusual Psych:  No- change in mood or  affect. No depression or anxiety.  No memory loss.  OBJ- Physical Exam General- Alert, Oriented, Affect-appropriate, Distress- none acute Skin- rash-none, lesions- none, excoriation- none Lymphadenopathy- none Head- atraumatic            Eyes- Gross vision intact, PERRLA, conjunctivae and secretions clear            Ears- Hearing, canals-normal            Nose- clear, no-Septal dev,  polyps, erosion, perforation             Throat- Mallampati II-III , mucosa clear , drainage- none, tonsils- atrophic Neck- flexible , trachea midline, no stridor , thyroid nl, carotid no bruit Chest - symmetrical excursion , unlabored           Heart/CV- RRR , no murmur , no gallop  , no rub, nl s1 s2                           - JVD- none , edema- none, stasis changes- none, varices- none           Lung- clear to P&A, wheeze- none, cough- none , dullness-none, rub- none           Chest wall-  Abd-  Br/ Gen/ Rectal- Not done, not indicated Extrem- cyanosis- none, clubbing, none, atrophy- none, strength- nl Neuro- grossly intact to observation

## 2013-05-18 NOTE — Assessment & Plan Note (Addendum)
He tells me his compliance is much better than demonstrated on the download in March and April. We discussed CPAP compliance and some alternatives.

## 2013-05-18 NOTE — Assessment & Plan Note (Signed)
No acute exacerbation  

## 2013-09-08 ENCOUNTER — Ambulatory Visit: Payer: BC Managed Care – PPO | Admitting: Internal Medicine

## 2013-10-22 ENCOUNTER — Ambulatory Visit: Payer: BC Managed Care – PPO | Admitting: Internal Medicine

## 2013-10-22 ENCOUNTER — Encounter: Payer: Self-pay | Admitting: Internal Medicine

## 2013-10-22 DIAGNOSIS — IMO0002 Reserved for concepts with insufficient information to code with codable children: Secondary | ICD-10-CM | POA: Insufficient documentation

## 2013-10-22 DIAGNOSIS — E1169 Type 2 diabetes mellitus with other specified complication: Secondary | ICD-10-CM | POA: Insufficient documentation

## 2013-10-22 DIAGNOSIS — E119 Type 2 diabetes mellitus without complications: Secondary | ICD-10-CM

## 2013-10-22 DIAGNOSIS — E785 Hyperlipidemia, unspecified: Secondary | ICD-10-CM

## 2013-10-22 DIAGNOSIS — E1129 Type 2 diabetes mellitus with other diabetic kidney complication: Secondary | ICD-10-CM | POA: Insufficient documentation

## 2013-10-22 DIAGNOSIS — I10 Essential (primary) hypertension: Secondary | ICD-10-CM

## 2013-10-22 DIAGNOSIS — E1165 Type 2 diabetes mellitus with hyperglycemia: Secondary | ICD-10-CM | POA: Insufficient documentation

## 2013-10-23 ENCOUNTER — Ambulatory Visit: Payer: Self-pay

## 2013-10-23 ENCOUNTER — Encounter: Payer: Self-pay | Admitting: Emergency Medicine

## 2013-10-23 ENCOUNTER — Encounter (INDEPENDENT_AMBULATORY_CARE_PROVIDER_SITE_OTHER): Payer: Self-pay

## 2013-10-23 ENCOUNTER — Ambulatory Visit: Payer: BC Managed Care – PPO | Admitting: Emergency Medicine

## 2013-10-23 VITALS — BP 124/70 | HR 68 | Temp 98.2°F | Resp 18 | Ht 70.0 in | Wt 246.0 lb

## 2013-10-23 DIAGNOSIS — E559 Vitamin D deficiency, unspecified: Secondary | ICD-10-CM

## 2013-10-23 DIAGNOSIS — I10 Essential (primary) hypertension: Secondary | ICD-10-CM

## 2013-10-23 DIAGNOSIS — N390 Urinary tract infection, site not specified: Secondary | ICD-10-CM

## 2013-10-23 DIAGNOSIS — E119 Type 2 diabetes mellitus without complications: Secondary | ICD-10-CM

## 2013-10-23 DIAGNOSIS — R05 Cough: Secondary | ICD-10-CM

## 2013-10-23 DIAGNOSIS — E291 Testicular hypofunction: Secondary | ICD-10-CM

## 2013-10-23 DIAGNOSIS — E669 Obesity, unspecified: Secondary | ICD-10-CM

## 2013-10-23 DIAGNOSIS — R059 Cough, unspecified: Secondary | ICD-10-CM

## 2013-10-23 DIAGNOSIS — E782 Mixed hyperlipidemia: Secondary | ICD-10-CM

## 2013-10-23 LAB — LIPID PANEL
Cholesterol: 127 mg/dL (ref 0–200)
HDL: 28 mg/dL — ABNORMAL LOW (ref >39)
LDL Cholesterol: 85 mg/dL (ref 0–99)
Total CHOL/HDL Ratio: 4.5 Ratio
Triglycerides: 71 mg/dL (ref <150)
VLDL: 14 mg/dL (ref 0–40)

## 2013-10-23 LAB — HEPATIC FUNCTION PANEL
ALT: 14 U/L (ref 0–53)
AST: 15 U/L (ref 0–37)
Albumin: 4.2 g/dL (ref 3.5–5.2)
Alkaline Phosphatase: 58 U/L (ref 39–117)
Bilirubin, Direct: 0.2 mg/dL (ref 0.0–0.3)
Indirect Bilirubin: 0.6 mg/dL (ref 0.0–0.9)
Total Bilirubin: 0.8 mg/dL (ref 0.3–1.2)
Total Protein: 7.8 g/dL (ref 6.0–8.3)

## 2013-10-23 LAB — BASIC METABOLIC PANEL WITH GFR
BUN: 20 mg/dL (ref 6–23)
CO2: 29 mEq/L (ref 19–32)
Calcium: 10.3 mg/dL (ref 8.4–10.5)
Chloride: 96 mEq/L (ref 96–112)
Creat: 1.26 mg/dL (ref 0.50–1.35)
GFR, Est African American: 76 mL/min
GFR, Est Non African American: 66 mL/min
Glucose, Bld: 124 mg/dL — ABNORMAL HIGH (ref 70–99)
Potassium: 4.5 mEq/L (ref 3.5–5.3)
Sodium: 135 mEq/L (ref 135–145)

## 2013-10-23 LAB — CBC WITH DIFFERENTIAL/PLATELET
Basophils Absolute: 0 10*3/uL (ref 0.0–0.1)
Basophils Relative: 0 % (ref 0–1)
Eosinophils Absolute: 0.1 10*3/uL (ref 0.0–0.7)
Eosinophils Relative: 1 % (ref 0–5)
HCT: 54.2 % — ABNORMAL HIGH (ref 39.0–52.0)
Hemoglobin: 18.9 g/dL — ABNORMAL HIGH (ref 13.0–17.0)
Lymphocytes Relative: 34 % (ref 12–46)
Lymphs Abs: 3 10*3/uL (ref 0.7–4.0)
MCH: 28.7 pg (ref 26.0–34.0)
MCHC: 34.9 g/dL (ref 30.0–36.0)
MCV: 82.4 fL (ref 78.0–100.0)
Monocytes Absolute: 1 10*3/uL (ref 0.1–1.0)
Monocytes Relative: 12 % (ref 3–12)
Neutro Abs: 4.7 10*3/uL (ref 1.7–7.7)
Neutrophils Relative %: 53 % (ref 43–77)
Platelets: 304 10*3/uL (ref 150–400)
RBC: 6.58 MIL/uL — ABNORMAL HIGH (ref 4.22–5.81)
RDW: 15.7 % — ABNORMAL HIGH (ref 11.5–15.5)
WBC: 8.8 10*3/uL (ref 4.0–10.5)

## 2013-10-23 LAB — MAGNESIUM: Magnesium: 2.2 mg/dL (ref 1.5–2.5)

## 2013-10-23 LAB — HEMOGLOBIN A1C
Hgb A1c MFr Bld: 7.2 % — ABNORMAL HIGH (ref <5.7)
Mean Plasma Glucose: 160 mg/dL — ABNORMAL HIGH (ref <117)

## 2013-10-23 MED ORDER — PREDNISONE (PAK) 10 MG PO TABS: ORAL_TABLET | Freq: Every day | ORAL | Status: DC

## 2013-10-23 MED ORDER — TESTOSTERONE CYPIONATE 200 MG/ML IM SOLN
400.0000 mg | Freq: Once | INTRAMUSCULAR | Status: AC
  Administered 2013-10-23: 400 mg via INTRAMUSCULAR

## 2013-10-23 MED ORDER — AZITHROMYCIN 250 MG PO TABS: ORAL_TABLET | ORAL | Status: AC

## 2013-10-23 MED ORDER — PHENTERMINE HCL 37.5 MG PO CAPS: 37.5000 mg | ORAL_CAPSULE | ORAL | Status: DC

## 2013-10-23 NOTE — Patient Instructions (Signed)
Obesity Obesity is having too much body fat and a body mass index (BMI) of 30 or more. BMI is a number based on your height and weight. The number is an estimate of how much body fat you have. Obesity can happen if you eat more calories than you can burn by exercising or other activity. It can cause major health problems or emergencies.  HOME CARE  Exercise and be active as told by your doctor. Try:  Using stairs when you can.  Parking farther away from store doors.  Gardening, biking, or walking.  Eat healthy foods and drinks that are low in calories. Eat more fruits and vegetables.  Limit fast food, sweets, and snack foods that are made with ingredients that are not natural (processed food).  Eat smaller amounts of food.  Keep a journal and write down what you eat every day. Websites can help with this.  Avoid drinking alcohol. Drink more water and drinks without calories.   Take vitamins and dietary pills (supplements) only as told by your doctor.  Try going to weight-loss support groups or classes to help lessen stress. Dieticians and counselors may also help. GET HELP RIGHT AWAY IF:  You have chest pain or tightness.  You have trouble breathing or feel short of breath.  You feel weak or have loss of feeling (numbness) in your legs.  You feel confused or have trouble talking.  You have sudden changes in your vision. MAKE SURE YOU:  Understand these instructions.  Will watch your condition.  Will get help right away if you are not doing well or get worse. Document Released: 02/18/2012 Document Reviewed: 02/18/2012 [facility] Patient Information 2014 [place], [place]. Allergic Rhinitis Allergic rhinitis is when the mucous membranes in the nose respond to allergens. Allergens are particles in the air that cause your body to have an allergic reaction. This causes you to release allergic antibodies. Through a chain of events, these eventually cause you to release histamine  into the blood stream (hence the use of antihistamines). Although meant to be protective to the body, it is this release that causes your discomfort, such as frequent sneezing, congestion and an itchy runny nose.  CAUSES  The pollen allergens may come from grasses, trees, and weeds. This is seasonal allergic rhinitis, or "hay fever." Other allergens cause year-round allergic rhinitis (perennial allergic rhinitis) such as house dust mite allergen, pet dander and mold spores.  SYMPTOMS   Nasal stuffiness (congestion).  Runny, itchy nose with sneezing and tearing of the eyes.  There is often an itching of the mouth, eyes and ears. It cannot be cured, but it can be controlled with medications. DIAGNOSIS  If you are unable to determine the offending allergen, skin or blood testing may find it. TREATMENT   Avoid the allergen.  Medications and allergy shots (immunotherapy) can help.  Hay fever may often be treated with antihistamines in pill or nasal spray forms. Antihistamines block the effects of histamine. There are over-the-counter medicines that may help with nasal congestion and swelling around the eyes. Check with your caregiver before taking or giving this medicine. If the treatment above does not work, there are many new medications your caregiver can prescribe. Stronger medications may be used if initial measures are ineffective. Desensitizing injections can be used if medications and avoidance fails. Desensitization is when a patient is given ongoing shots until the body becomes less sensitive to the allergen. Make sure you follow up with your caregiver if problems continue. SEEK  MEDICAL CARE IF:   You develop fever (more than 100.5 F (38.1 C).  You develop a cough that does not stop easily (persistent).  You have shortness of breath.  You start wheezing.  Symptoms interfere with normal daily activities. Document Released: 08/21/2001 Document Revised: 02/18/2012 Document  Reviewed: 03/02/2009 [facility] Patient Information 2014 [place], [place].

## 2013-10-23 NOTE — Progress Notes (Signed)
Subjective:    Patient ID: Russell [name], male    DOB: 28-Oct-1961, 52 y.o.   MRN: [id number]  HPI Comments: 52 yo male presents for 3 month F/U for HTN, Cholesterol, IDDM, D. Deficient. He was treated for UTI/ Prostate infection at last OV and notes SX have resolved. He has been eating better and losing wt with the Phentermine RX given by Dr. Oneta Rack. He is exercising regularly. His wife has noticed his snoring and nasal/ chest congestion have increased over last 2 weeks. He notes only a.m. Production with color, rest of day clear. He is taking Mucinex Dm to help with mucus.   Hypertension  Hyperlipidemia    Current Outpatient Prescriptions on File Prior to Visit  Medication Sig Dispense Refill  . aspirin 81 MG tablet Take 81 mg by mouth 2 (two) times daily.       Marland Kitchen atorvastatin (LIPITOR) 40 MG tablet Take 40 mg by mouth daily. 1/2      . bisoprolol-hydrochlorothiazide (ZIAC) 5-6.25 MG per tablet Take 1 tablet by mouth daily.      . Canagliflozin (INVOKANA) 300 MG TABS Take 1 tablet by mouth daily.      . ciclesonide (OMNARIS) 50 MCG/ACT nasal spray Place 2 sprays into both nostrils daily.      Marland Kitchen ezetimibe (ZETIA) 10 MG tablet Take 10 mg by mouth daily.      . hydrochlorothiazide (HYDRODIURIL) 12.5 MG tablet Take 12.5 mg by mouth daily.      . insulin detemir (LEVEMIR) 100 UNIT/ML injection Inject 15 Units into the skin at bedtime.      . lansoprazole (PREVACID) 30 MG capsule Take 30 mg by mouth daily at 12 noon.      Marland Kitchen losartan (COZAAR) 100 MG tablet Take 100 mg by mouth daily.      . metFORMIN (GLUCOPHAGE) 500 MG tablet Take 1,000 mg by mouth 2 (two) times daily with a meal.      . testosterone cypionate (DEPOTESTOTERONE CYPIONATE) 200 MG/ML injection Inject 200 mg into the muscle every 14 (fourteen) days.      . Vitamin D, Ergocalciferol, (DRISDOL) 50000 UNITS CAPS capsule Take 50,000 Units by mouth. 2 X WEEK      . Azelastine-Fluticasone 137-50 MCG/ACT SUSP Place 1-2 sprays into the  nose at bedtime.  1 Bottle  0   No current facility-administered medications on file prior to visit.   ALLERGIES Ppd  Past Medical History  Diagnosis Date  . Hypertension   . Hyperlipidemia   . Diabetes mellitus   . Hypogonadism male   . Chronic sinusitis   . Allergy   . Allergic rhinitis      Review of Systems  HENT: Positive for congestion, postnasal drip and sinus pressure.   Respiratory: Positive for cough.   All other systems reviewed and are negative.       Objective:   Physical Exam  Nursing note and vitals reviewed. Constitutional: He is oriented to person, place, and time. He appears well-developed and well-nourished.  HENT:  Head: Normocephalic and atraumatic.  Right Ear: External ear normal.  Left Ear: External ear normal.  Nose: Nose normal.  Mouth/Throat: Oropharynx is clear and moist.  Injected and Yellow TMs  Eyes: Conjunctivae are normal. Pupils are equal, round, and reactive to light.  Neck: Normal range of motion. Neck supple.  Cardiovascular: Normal rate, regular rhythm, normal heart sounds and intact distal pulses.   Pulmonary/Chest: Effort normal.  Barky cough  Abdominal: Soft.  Musculoskeletal:  Normal range of motion.  Lymphadenopathy:    He has no cervical adenopathy.  Neurological: He is alert and oriented to person, place, and time.  Skin: Skin is warm and dry.  Psychiatric: He has a normal mood and affect. Judgment normal.          Assessment & Plan:  1. 3 month F/U for HTN, Cholesterol, IDDM, D. Deficient, check labs 2. Obese refill Phentermine AD 3. Sinusitis/ Bronchitis Zpak, Allegra, Mucinex AD increase H2O. If no change in 2 days add Pred 10 mg DP AD 4. Recheck Recent treated UTI

## 2013-10-24 LAB — URINALYSIS, ROUTINE W REFLEX MICROSCOPIC
Bilirubin Urine: NEGATIVE
Glucose, UA: >1000 mg/dL — AB
Hgb urine dipstick: NEGATIVE
Ketones, ur: NEGATIVE mg/dL
Leukocytes, UA: NEGATIVE
Nitrite: NEGATIVE
Protein, ur: NEGATIVE mg/dL
Specific Gravity, Urine: 1.028 (ref 1.005–1.030)
Urobilinogen, UA: 0.2 mg/dL (ref 0.0–1.0)
pH: 5.5 (ref 5.0–8.0)

## 2013-10-24 LAB — URINALYSIS, MICROSCOPIC ONLY
Bacteria, UA: NONE SEEN
Casts: NONE SEEN
Crystals: NONE SEEN
Squamous Epithelial / LPF: NONE SEEN

## 2013-10-24 LAB — URINE CULTURE: Organism ID, Bacteria: NO GROWTH

## 2013-10-24 LAB — VITAMIN D 25 HYDROXY (VIT D DEFICIENCY, FRACTURES): Vit D, 25-Hydroxy: >120 ng/mL — ABNORMAL HIGH (ref 30–89)

## 2013-10-31 ENCOUNTER — Other Ambulatory Visit: Payer: Self-pay | Admitting: Internal Medicine

## 2013-11-03 ENCOUNTER — Other Ambulatory Visit: Payer: Self-pay | Admitting: Emergency Medicine

## 2013-11-03 MED ORDER — INSULIN PEN NEEDLE 31G X 5 MM MISC: Status: DC

## 2013-11-03 MED ORDER — ATORVASTATIN CALCIUM 40 MG PO TABS: 40.0000 mg | ORAL_TABLET | Freq: Every day | ORAL | Status: DC

## 2013-11-03 MED ORDER — LOSARTAN POTASSIUM 100 MG PO TABS: 100.0000 mg | ORAL_TABLET | Freq: Every day | ORAL | Status: DC

## 2013-12-08 ENCOUNTER — Ambulatory Visit: Payer: BC Managed Care – PPO | Admitting: Internal Medicine

## 2013-12-15 ENCOUNTER — Encounter: Payer: Self-pay | Admitting: *Deleted

## 2013-12-15 ENCOUNTER — Ambulatory Visit (INDEPENDENT_AMBULATORY_CARE_PROVIDER_SITE_OTHER): Payer: BC Managed Care – PPO | Admitting: Internal Medicine

## 2013-12-15 ENCOUNTER — Encounter: Payer: Self-pay | Admitting: Internal Medicine

## 2013-12-15 VITALS — BP 120/84 | HR 84 | Ht 69.0 in | Wt 240.6 lb

## 2013-12-15 DIAGNOSIS — G4733 Obstructive sleep apnea (adult) (pediatric): Secondary | ICD-10-CM

## 2013-12-15 NOTE — Progress Notes (Signed)
10/09/12- 54 yoM former smoker referred for allergy consultation, courtesy of Dr Melford Aase He had been referred to ENT, Dr Radene Journey, who prescribed Omnaris, Neti pot and suggested Allegra.. has had eustachian dysfunction for many years. Perennial nasal congestion and postnasal drainage. Occasionally nose burns and he will sneeze. Uses Afrin at bedtime. ENT surgery only for wisdom teeth. Eating and temperature changes cause watery rhinorrhea and salivation. Loud snoring. Feels better when he is closer to the beach. Lots of sinus congestion and drainage. Sputum is clear in color. Quit smoking 2008. 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 Sometimes environment at work is dirty.  11/10/12- 47 yoM former smoker referred for allergic rhinitis       PCP Dr Melford Aase FOLLOWS FOR: used Dymista and had good relief from it. After he ran out of medication he continued to have "swelling" in his sinuses. Wife is here and says husband is a loud snorer with witnessed apnea. He falls asleep watching TV. We discussed sleep apnea. Dymista nasal spray helps nose but did not help his snoring. He elevates the head with pillows to control reflux. Previously tried on Omnaris and Nasonex. Allergy Profile 09/09/2012-total IgE 16.5, no specific elevations  12/26/12 80 yoM former smoker referred for allergic rhinitis, OSA       PCP Dr Melford Aase FOLLOWS FOR: Never had Home sleep study due to never being called  about getting machine from our office. Dymista nasal spray  does work for his nasal congestion but he doesn't like the taste. Allergy Profile- 10/09/2012-negative. Total IgE 16.5 with no specific allergen elevations.  02/06/13- 23 yoM former smoker referred for allergic rhinitis, OSA       PCP Dr Melford Aase FOLLOWS FOR: review home sleep study with patient. Doing welll with Nasalcrom nasal spray Unattended Home Sleep Study 01/07/13- severe OSA AHI 35.3/ hr We reviewed the study, medical implications and treatment  options. Nasalcrom helps his nose some. Prefers to sleep on stomach.  He is willing to try CPAP- to start w/ autotitration.   03/20/13- 62 yoM former smoker referred for allergic rhinitis, OSA       PCP Dr Melford Aase FOLLOWS [id number] use to CPAP -will get download from [facility] once patient is at home to help with download. Feels better but only sleeping 4-5 hours a night. He feels he is getting used to CPAP. He wakes after sleeping for 5 hours which is his traditional pattern, and that he is getting enough sleep. No longer snores. Seasonal pollen rhinitis for the past week, using Claritin and occasional Afrin. He also uses Omnaris nasal spray each morning and Dymista nasal spray at night. I discussed this.  05/08/13-  40 yoM former smoker referred for allergic rhinitis, OSA       PCP Dr Melford Aase FOLLOWS FOR: wears CPAP/ Auto[facility]ap [facility] every night for about 4 -6 hours; pressure works well for patient. Download indicates he is not using it does not each night. Pressure of 9 would give good control. We discussed compliance and comfort. He indicates he is doing much better now. His wife has told him he breathes comfortably without snoring using CPAP  12/15/13- 72 yoM former smoker referred for allergic rhinitis, OSA       PCP Dr Melford Aase FOLLOWS FOR: CPAP needs to be repaired-broken for about 3-4 weeks; calling [facility] today to get that fixed.  He has lost 11 pounds since April, on phentermine from  PCP. We discussed weight loss as a potential way  to get free  of CPAP. He denies daytime sleepiness without the mask currently.  ROS-see HPI Constitutional:   +weight loss, night sweats, fevers, chills, fatigue, lassitude. HEENT:   No-  headaches, difficulty swallowing, tooth/dental problems, sore throat,       No-  sneezing, itching, ear ache, +nasal congestion, post nasal drip,  CV:  No-   chest pain, orthopnea, PND, swelling in lower extremities, anasarca, dizziness, palpitations Resp: No-   shortness of breath with  exertion or at rest.              No-   productive cough,  No non-productive cough,  No- coughing up of blood.              No-   change in color of mucus.  No- wheezing.   Skin: No-   rash or lesions. GI:  No-   heartburn, indigestion, abdominal pain, nausea,  GU:. MS:  No-   joint pain or swelling.   Neuro-     nothing unusual Psych:  No- change in mood or affect. No depression or anxiety.  No memory loss.  OBJ- Physical Exam General- Alert, Oriented, Affect-appropriate, Distress- none acute, appears fit Skin- rash-none, lesions- none, excoriation- none Lymphadenopathy- none Head- atraumatic            Eyes- Gross vision intact, PERRLA, conjunctivae and secretions clear            Ears- Hearing, canals-normal            Nose- clear, no-Septal dev,  polyps, erosion, perforation             Throat- Mallampati II-III , mucosa clear , drainage- none, tonsils- atrophic Neck- flexible , trachea midline, no stridor , thyroid nl, carotid no bruit Chest - symmetrical excursion , unlabored           Heart/CV- RRR , no murmur , no gallop  , no rub, nl s1 s2                           - JVD- none , edema- none, stasis changes- none, varices- none           Lung- clear to P&A, wheeze- none, cough- none , dullness-none, rub- none           Chest wall-  Abd-  Br/ Gen/ Rectal- Not done, not indicated Extrem- cyanosis- none, clubbing, none, atrophy- none, strength- nl Neuro- grossly intact to observation

## 2013-12-15 NOTE — Patient Instructions (Signed)
Ask your family how much you are snoring now, off CPAP, since you have lost weight.  If you are still snoring, I would recommend a repeat sleep study before permanently coming off CPAP.

## 2013-12-21 ENCOUNTER — Other Ambulatory Visit: Payer: Self-pay | Admitting: Emergency Medicine

## 2013-12-21 DIAGNOSIS — E669 Obesity, unspecified: Secondary | ICD-10-CM

## 2013-12-21 MED ORDER — PHENTERMINE HCL 37.5 MG PO CAPS: 37.5000 mg | ORAL_CAPSULE | ORAL | Status: DC

## 2013-12-24 ENCOUNTER — Other Ambulatory Visit: Payer: Self-pay | Admitting: Internal Medicine

## 2013-12-27 ENCOUNTER — Other Ambulatory Visit: Payer: Self-pay | Admitting: Internal Medicine

## 2014-01-06 ENCOUNTER — Ambulatory Visit (INDEPENDENT_AMBULATORY_CARE_PROVIDER_SITE_OTHER): Payer: BC Managed Care – PPO | Admitting: Internal Medicine

## 2014-01-06 ENCOUNTER — Encounter: Payer: Self-pay | Admitting: Internal Medicine

## 2014-01-06 VITALS — BP 112/80 | HR 64 | Temp 98.1°F | Resp 16 | Ht 70.25 in | Wt 235.4 lb

## 2014-01-06 DIAGNOSIS — E119 Type 2 diabetes mellitus without complications: Secondary | ICD-10-CM

## 2014-01-06 DIAGNOSIS — I10 Essential (primary) hypertension: Secondary | ICD-10-CM

## 2014-01-06 DIAGNOSIS — Z79899 Other long term (current) drug therapy: Secondary | ICD-10-CM

## 2014-01-06 DIAGNOSIS — E785 Hyperlipidemia, unspecified: Secondary | ICD-10-CM

## 2014-01-06 DIAGNOSIS — Z0000 Encounter for general adult medical examination without abnormal findings: Secondary | ICD-10-CM

## 2014-01-06 DIAGNOSIS — E559 Vitamin D deficiency, unspecified: Secondary | ICD-10-CM

## 2014-01-06 DIAGNOSIS — R740 Nonspecific elevation of levels of transaminase and lactic acid dehydrogenase [LDH]: Secondary | ICD-10-CM

## 2014-01-06 DIAGNOSIS — R7401 Elevation of levels of liver transaminase levels: Secondary | ICD-10-CM

## 2014-01-06 DIAGNOSIS — Z125 Encounter for screening for malignant neoplasm of prostate: Secondary | ICD-10-CM

## 2014-01-06 DIAGNOSIS — E291 Testicular hypofunction: Secondary | ICD-10-CM

## 2014-01-06 DIAGNOSIS — Z1212 Encounter for screening for malignant neoplasm of rectum: Secondary | ICD-10-CM

## 2014-01-06 DIAGNOSIS — Z113 Encounter for screening for infections with a predominantly sexual mode of transmission: Secondary | ICD-10-CM

## 2014-01-06 DIAGNOSIS — R7402 Elevation of levels of lactic acid dehydrogenase (LDH): Secondary | ICD-10-CM

## 2014-01-06 MED ORDER — TESTOSTERONE CYPIONATE 200 MG/ML IM SOLN
400.0000 mg | Freq: Once | INTRAMUSCULAR | Status: AC
  Administered 2014-01-06: 400 mg via INTRAMUSCULAR

## 2014-01-06 NOTE — Patient Instructions (Signed)

## 2014-01-06 NOTE — Progress Notes (Signed)
Patient ID: Russell [name], male   DOB: 04-01-1961, 53 y.o.   MRN: [id number]  Annual Screening Comprehensive Examination  This very nice 53 y.o.  MBM presents for complete physical.  Patient has been followed for HTN, T1 Diabetes, Hyperlipidemia, aTestosterone and [name].   HTN predates since 2000. Patient's BP has been controlled at home.Today's BP: 112/80 mmHg. Patient denies any cardiac symptoms as chest pain, palpitations, shortness of breath, dizziness or ankle swelling.   Patient's hyperlipidemia is controlled with diet and medications. Patient denies myalgias or other medication SE's. Last cholesterol last visit was 107 , triglycerides 94, HDL 26 and LDL 62 in July 5732.     Patient has Diabetes predating since 2005  with last A1c 5.8% in July 2014.  He has been on treatment with Metformin, Levemir and Invokana. CBGs usu range less than 140 mg% Patient denies reactive hypoglycemic symptoms, visual blurring, diabetic polys, or paresthesias. He has lost 37 # over the last 3 years with most of the loss occuring since adding Phentermine to his med regimen.   Finally, patient has history of [name] of 28 in 2008  with last vitamin D 6.8% in July 2014. He also has Testosterone Deficiency with level of 198 in 2008 and has been on replacement therapy       Medication List         aspirin 81 MG tablet  Take 81 mg by mouth 2 (two) times daily.     atorvastatin 40 MG tablet  Commonly known as:  LIPITOR  Take 1 tablet (40 mg total) by mouth daily. 1/2     Azelastine-Fluticasone 137-50 MCG/ACT Susp  Place 1-2 sprays into the nose at bedtime.     bisoprolol-hydrochlorothiazide 5-6.25 MG per tablet  Commonly known as:  ZIAC  Take 1 tablet by mouth daily.     ciclesonide 50 MCG/ACT nasal spray  Commonly known as:  OMNARIS  Place 2 sprays into both nostrils daily.     hydrochlorothiazide 12.5 MG capsule  Commonly known as:  MICROZIDE  TAKE ONE CAPSULE BY MOUTH  EVERY DAY     insulin detemir 100 UNIT/ML injection  Commonly known as:  LEVEMIR  Inject 15 Units into the skin at bedtime.     Insulin Pen Needle 31G X 5 MM Misc  1 qd     INVOKANA 300 MG Tabs  Generic drug:  Canagliflozin  Take 1 tablet by mouth daily.     lansoprazole 30 MG capsule  Commonly known as:  PREVACID  Take 30 mg by mouth daily at 12 noon.     losartan 100 MG tablet  Commonly known as:  COZAAR  Take 1 tablet (100 mg total) by mouth daily.     metFORMIN 500 MG tablet  Commonly known as:  GLUCOPHAGE  Take 1,000 mg by mouth 2 (two) times daily with a meal.     phentermine 37.5 MG capsule  Take 1 capsule (37.5 mg total) by mouth every morning.     testosterone cypionate 200 MG/ML injection  Commonly known as:  DEPOTESTOTERONE CYPIONATE  Inject 200 mg into the muscle every 14 (fourteen) days.     Vitamin D (Ergocalciferol) 50000 UNITS Caps capsule  Commonly known as:  DRISDOL  Take 50,000 Units by mouth. 2 X WEEK     ZETIA 10 MG tablet  Generic drug:  ezetimibe  TAKE 1 TABLET DAILY FOR CHOLESTEROL        Allergies  Allergen  Reactions  . Ppd [Tuberculin Purified Protein Derivative] Other (See Comments)    POSITIVE REACTION IN PAST 11/21/11 CXR NEG    Past Medical History  Diagnosis Date  . Hypertension   . Hyperlipidemia   . Diabetes mellitus   . Hypogonadism male   . Chronic sinusitis   . Allergy   . Allergic rhinitis     Past Surgical History  Procedure Laterality Date  . Wisdom tooth extraction    . Hernia repair      Family History  Problem Relation Age of Onset  . Diabetes Mother   . Hypertension Mother   . Hyperlipidemia Mother   . Diabetes Father   . Hypertension Father   . Hyperlipidemia Father   . Diabetes Brother   . Hypertension Brother     History   Social History  . Marital Status: Divorced    Spouse Name: N/A    Number of Children: N/A  . Years of Education: N/A   Occupational History  . Not on file.    Social History Main Topics  . Smoking status: Former Smoker -- 0.30 packs/day for 8 years    Types: Cigarettes    Quit date: 12/10/2002  . Smokeless tobacco: Not on file  . Alcohol Use: No     Comment: Special Occassions  . Drug Use: No  . Sexual Activity: Not on file   Other Topics Concern  . Not on file   Social History Narrative  . No narrative on file    ROS Constitutional: Denies fever, chills, weight loss/gain, headaches, insomnia, fatigue, night sweats, and change in appetite. Eyes: Denies redness, blurred vision, diplopia, discharge, itchy, watery eyes.  ENT: Denies discharge, congestion, post nasal drip, epistaxis, sore throat, earache, hearing loss, dental pain, Tinnitus, Vertigo, Sinus pain, snoring.  Cardio: Denies chest pain, palpitations, irregular heartbeat, syncope, dyspnea, diaphoresis, orthopnea, PND, claudication, edema Respiratory: denies cough, dyspnea, DOE, pleurisy, hoarseness, laryngitis, wheezing.  Gastrointestinal: Denies dysphagia, heartburn, reflux, water brash, pain, cramps, nausea, vomiting, bloating, diarrhea, constipation, hematemesis, melena, hematochezia, jaundice, hemorrhoids Genitourinary: Denies dysuria, frequency, urgency, nocturia, hesitancy, discharge, hematuria, flank pain Musculoskeletal: Denies arthralgia, myalgia, stiffness, Jt. Swelling, pain, limp, and strain/sprain. Skin: Denies puritis, rash, hives, warts, acne, eczema, changing in skin lesion Neuro: No weakness, tremor, incoordination, spasms, paresthesia, pain Psychiatric: Denies confusion, memory loss, sensory loss Endocrine: Denies change in weight, skin, hair change, nocturia, and paresthesia, diabetic polys, visual blurring, hyper / hypo glycemic episodes.  Heme/Lymph: No excessive bleeding, bruising, or elarged lymph nodes.  BP: 112/80  Pulse: 64  Temp: 98.1 F (36.7 C)  Resp: 16    Estimated body mass index is 33.55 kg/(m^2) as calculated from the following:   Height  as of this encounter: 5' 10.25" (1.784 m).   Weight as of this encounter: 235 lb 6.4 oz (106.777 kg).  Physical Exam General Appearance: Well nourished, in no apparent distress. Eyes: PERRLA, EOMs, conjunctiva no swelling or erythema, normal fundi and vessels. Sinuses: No frontal/maxillary tenderness ENT/Mouth: EACs patent / TMs  nl. Nares clear without erythema, swelling, mucoid exudates. Oral hygiene is good. No erythema, swelling, or exudate. Tongue normal, non-obstructing. Tonsils not swollen or erythematous. Hearing normal.  Neck: Supple, thyroid normal. No bruits, nodes or JVD. Respiratory: Respiratory effort normal.  BS equal and clear bilateral without rales, rhonci, wheezing or stridor. Cardio: Heart sounds are normal with regular rate and rhythm and no murmurs, rubs or gallops. Peripheral pulses are normal and equal bilaterally without edema. No aortic  or femoral bruits. Chest: symmetric with normal excursions and percussion.  Abdomen: Flat, soft, with bowl sounds. Nontender, no guarding, rebound, hernias, masses, or organomegaly.  Lymphatics: Non tender without lymphadenopathy.  Genitourinary: No hernias.Testes nl. DRE - prostate nl for age - smooth & firm w/o nodules. Musculoskeletal: Full ROM all peripheral extremities, joint stability, 5/5 strength, and normal gait. Skin: Warm and dry without rashes, lesions, cyanosis, clubbing or  ecchymosis.  Neuro: Cranial nerves intact, reflexes equal bilaterally. Normal muscle tone, no cerebellar symptoms. Sensation intact.  Pysch: Awake and oriented X 3, normal affect, insight and judgment appropriate.   Assessment and Plan  1. Annual Screening Examination 2. Hypertension  3. Hyperlipidemia 4. T1 IDDM 5. [name]  Continue prudent diet as discussed, weight control, BP monitoring, regular exercise, and medications as discussed.  Discussed med effects and SE's. Routine screening labs and tests as requested with regular  follow-up as recommended.

## 2014-01-07 ENCOUNTER — Other Ambulatory Visit: Payer: Self-pay | Admitting: Internal Medicine

## 2014-01-07 LAB — RPR

## 2014-01-07 LAB — HEMOGLOBIN A1C
Hgb A1c MFr Bld: 8 % — ABNORMAL HIGH (ref <5.7)
Mean Plasma Glucose: 183 mg/dL — ABNORMAL HIGH (ref <117)

## 2014-01-07 LAB — CBC WITH DIFFERENTIAL/PLATELET
Basophils Absolute: 0 10*3/uL (ref 0.0–0.1)
Basophils Relative: 0 % (ref 0–1)
Eosinophils Absolute: 0.1 10*3/uL (ref 0.0–0.7)
Eosinophils Relative: 1 % (ref 0–5)
HCT: 53.4 % — ABNORMAL HIGH (ref 39.0–52.0)
Hemoglobin: 18.4 g/dL — ABNORMAL HIGH (ref 13.0–17.0)
Lymphocytes Relative: 60 % — ABNORMAL HIGH (ref 12–46)
Lymphs Abs: 4.5 10*3/uL — ABNORMAL HIGH (ref 0.7–4.0)
MCH: 28.1 pg (ref 26.0–34.0)
MCHC: 34.5 g/dL (ref 30.0–36.0)
MCV: 81.5 fL (ref 78.0–100.0)
Monocytes Absolute: 0.8 10*3/uL (ref 0.1–1.0)
Monocytes Relative: 11 % (ref 3–12)
Neutro Abs: 2 10*3/uL (ref 1.7–7.7)
Neutrophils Relative %: 28 % — ABNORMAL LOW (ref 43–77)
Platelets: 333 10*3/uL (ref 150–400)
RBC: 6.55 MIL/uL — ABNORMAL HIGH (ref 4.22–5.81)
RDW: 14.7 % (ref 11.5–15.5)
WBC: 7.4 10*3/uL (ref 4.0–10.5)

## 2014-01-07 LAB — URINALYSIS, MICROSCOPIC ONLY
Bacteria, UA: NONE SEEN
Casts: NONE SEEN
Crystals: NONE SEEN
Squamous Epithelial / LPF: NONE SEEN

## 2014-01-07 LAB — LIPID PANEL
Cholesterol: 136 mg/dL (ref 0–200)
HDL: 31 mg/dL — ABNORMAL LOW (ref >39)
LDL Cholesterol: 87 mg/dL (ref 0–99)
Total CHOL/HDL Ratio: 4.4 Ratio
Triglycerides: 89 mg/dL (ref <150)
VLDL: 18 mg/dL (ref 0–40)

## 2014-01-07 LAB — PSA: PSA: 1.69 ng/mL (ref <=4.00)

## 2014-01-07 LAB — HEPATIC FUNCTION PANEL
ALT: 27 U/L (ref 0–53)
AST: 25 U/L (ref 0–37)
Albumin: 4.4 g/dL (ref 3.5–5.2)
Alkaline Phosphatase: 55 U/L (ref 39–117)
Bilirubin, Direct: 0.2 mg/dL (ref 0.0–0.3)
Indirect Bilirubin: 0.7 mg/dL (ref 0.2–1.2)
Total Bilirubin: 0.9 mg/dL (ref 0.2–1.2)
Total Protein: 7.6 g/dL (ref 6.0–8.3)

## 2014-01-07 LAB — TSH: TSH: 1.043 u[IU]/mL (ref 0.350–4.500)

## 2014-01-07 LAB — BASIC METABOLIC PANEL WITH GFR
BUN: 21 mg/dL (ref 6–23)
CO2: 31 mEq/L (ref 19–32)
Calcium: 10.5 mg/dL (ref 8.4–10.5)
Chloride: 98 mEq/L (ref 96–112)
Creat: 1.13 mg/dL (ref 0.50–1.35)
GFR, Est African American: 87 mL/min
GFR, Est Non African American: 75 mL/min
Glucose, Bld: 79 mg/dL (ref 70–99)
Potassium: 4.3 mEq/L (ref 3.5–5.3)
Sodium: 140 mEq/L (ref 135–145)

## 2014-01-07 LAB — HIV ANTIBODY (ROUTINE TESTING W REFLEX): HIV: NONREACTIVE

## 2014-01-07 LAB — TESTOSTERONE: Testosterone: 489 ng/dL (ref 300–890)

## 2014-01-07 LAB — VITAMIN D 25 HYDROXY (VIT D DEFICIENCY, FRACTURES): Vit D, 25-Hydroxy: 104 ng/mL — ABNORMAL HIGH (ref 30–89)

## 2014-01-07 LAB — MAGNESIUM: Magnesium: 1.9 mg/dL (ref 1.5–2.5)

## 2014-01-07 LAB — HEPATITIS B SURFACE ANTIBODY,QUALITATIVE: Hep B S Ab: POSITIVE — AB

## 2014-01-07 LAB — MICROALBUMIN / CREATININE URINE RATIO
Creatinine, Urine: 109.7 mg/dL
Microalb Creat Ratio: 9.2 mg/g (ref 0.0–30.0)
Microalb, Ur: 1.01 mg/dL (ref 0.00–1.89)

## 2014-01-07 LAB — INSULIN, FASTING: Insulin fasting, serum: 74 u[IU]/mL — ABNORMAL HIGH (ref 3–28)

## 2014-01-07 LAB — HEPATITIS A ANTIBODY, TOTAL: Hep A Total Ab: NONREACTIVE

## 2014-01-07 LAB — HEPATITIS B CORE ANTIBODY, TOTAL: Hep B Core Total Ab: NONREACTIVE

## 2014-01-07 LAB — VITAMIN B12: Vitamin B-12: 593 pg/mL (ref 211–911)

## 2014-01-07 LAB — HEPATITIS B E ANTIBODY: Hepatitis Be Antibody: NEGATIVE

## 2014-01-07 LAB — HEPATITIS C ANTIBODY: HCV Ab: NEGATIVE

## 2014-01-08 NOTE — Assessment & Plan Note (Signed)
Now after  11 pound weight loss, denies daytime sleepiness without using CPAP.we will have to see how well he maintains the weight loss off medication. He will start I asked his family to observe for snoring and stopping breathing. Consider followup sleep study.

## 2014-01-18 ENCOUNTER — Other Ambulatory Visit: Payer: Self-pay | Admitting: Emergency Medicine

## 2014-01-18 ENCOUNTER — Other Ambulatory Visit: Payer: Self-pay | Admitting: Internal Medicine

## 2014-01-18 MED ORDER — CANAGLIFLOZIN 100 MG PO TABS: ORAL_TABLET | ORAL | Status: DC

## 2014-01-19 ENCOUNTER — Telehealth: Payer: Self-pay | Admitting: *Deleted

## 2014-01-19 NOTE — Telephone Encounter (Signed)
Patient called requesting refill on Phentermine Rx and samples of Invokana for one week because pharmacy does not have in stock.  Per Kelby Aline, PA-C, Phentermine refill denied because patient has been getting Rx refilled each month since 08/2013.  Per Lenna Sciara, advised patient he needs to go on break from Rx for 6 months per her orders. Samples of Invokana placed up front for patient to pick up.

## 2014-01-22 ENCOUNTER — Ambulatory Visit (INDEPENDENT_AMBULATORY_CARE_PROVIDER_SITE_OTHER): Payer: BC Managed Care – PPO | Admitting: *Deleted

## 2014-01-22 ENCOUNTER — Other Ambulatory Visit: Payer: Self-pay | Admitting: *Deleted

## 2014-01-22 DIAGNOSIS — E291 Testicular hypofunction: Secondary | ICD-10-CM

## 2014-01-22 MED ORDER — TESTOSTERONE CYPIONATE 200 MG/ML IM SOLN: 200.0000 mg | INTRAMUSCULAR | Status: DC

## 2014-01-22 MED ORDER — TESTOSTERONE CYPIONATE 200 MG/ML IM SOLN
400.0000 mg | Freq: Once | INTRAMUSCULAR | Status: AC
  Administered 2014-01-22: 400 mg via INTRAMUSCULAR

## 2014-01-22 NOTE — Progress Notes (Signed)
Patient ID: Russell [name], male   DOB: June 14, 1961, 53 y.o.   MRN: [id number] Patient presents for nurse visit for testosterone injection.  Area was cleaned with alcohol and Patient received 2 cc IM Left Glute.  Patient tolerated well.

## 2014-01-26 ENCOUNTER — Other Ambulatory Visit: Payer: Self-pay | Admitting: Internal Medicine

## 2014-01-29 ENCOUNTER — Other Ambulatory Visit: Payer: Self-pay | Admitting: Internal Medicine

## 2014-02-05 ENCOUNTER — Ambulatory Visit (INDEPENDENT_AMBULATORY_CARE_PROVIDER_SITE_OTHER): Payer: BC Managed Care – PPO | Admitting: *Deleted

## 2014-02-05 DIAGNOSIS — E291 Testicular hypofunction: Secondary | ICD-10-CM

## 2014-02-05 MED ORDER — TESTOSTERONE CYPIONATE 200 MG/ML IM SOLN
400.0000 mg | Freq: Once | INTRAMUSCULAR | Status: AC
  Administered 2014-02-05: 400 mg via INTRAMUSCULAR

## 2014-02-05 NOTE — Progress Notes (Signed)
Patient ID: Russell [name], male   DOB: [birth year], 53 y.o.   MRN: [id number] Patient presents for Nurse Visit for testosterone injection.  Area was cleaned with alcohol and patient received 2 cc IM Right Glute.  Patient tolerated well.  Will follow up in 2 weeks for next shot as directed.

## 2014-02-19 ENCOUNTER — Ambulatory Visit: Payer: Self-pay

## 2014-02-22 ENCOUNTER — Ambulatory Visit (INDEPENDENT_AMBULATORY_CARE_PROVIDER_SITE_OTHER): Payer: BC Managed Care – PPO | Admitting: *Deleted

## 2014-02-22 DIAGNOSIS — E291 Testicular hypofunction: Secondary | ICD-10-CM

## 2014-02-22 MED ORDER — TESTOSTERONE CYPIONATE 200 MG/ML IM SOLN
400.0000 mg | Freq: Once | INTRAMUSCULAR | Status: AC
  Administered 2014-02-22: 400 mg via INTRAMUSCULAR

## 2014-02-22 NOTE — Progress Notes (Signed)
Patient ID: Russell [name], male   DOB: [birth year], 53 y.o.   MRN: [id number] Patient presents for NV for testosterone injection for Tx of hypogonadism.  Area was cleaned with alcohol and patient received 2 cc IM Left glute.  Patient tolerated well.

## 2014-03-05 ENCOUNTER — Ambulatory Visit (INDEPENDENT_AMBULATORY_CARE_PROVIDER_SITE_OTHER): Payer: BC Managed Care – PPO | Admitting: *Deleted

## 2014-03-05 DIAGNOSIS — E291 Testicular hypofunction: Secondary | ICD-10-CM

## 2014-03-05 MED ORDER — TESTOSTERONE CYPIONATE 200 MG/ML IM SOLN
400.0000 mg | INTRAMUSCULAR | Status: DC
  Administered 2014-03-05 – 2014-06-04 (×7): 400 mg via INTRAMUSCULAR

## 2014-03-19 ENCOUNTER — Other Ambulatory Visit: Payer: Self-pay | Admitting: *Deleted

## 2014-03-19 ENCOUNTER — Ambulatory Visit (INDEPENDENT_AMBULATORY_CARE_PROVIDER_SITE_OTHER): Payer: BC Managed Care – PPO | Admitting: *Deleted

## 2014-03-19 DIAGNOSIS — E291 Testicular hypofunction: Secondary | ICD-10-CM

## 2014-03-19 MED ORDER — TESTOSTERONE CYPIONATE 200 MG/ML IM SOLN: 400.0000 mg | INTRAMUSCULAR | Status: DC

## 2014-03-19 NOTE — Progress Notes (Signed)
Patient ID: Russell [name], male   DOB: 25-Oct-1961, 53 y.o.   MRN: [id number] Patient presents for nurse visit for testosterone injection for hypogonadism.  Area was cleaned with alcohol and patient received 2 cc IM left glute.  Patient tolerated well.

## 2014-04-02 ENCOUNTER — Encounter: Payer: Self-pay | Admitting: Emergency Medicine

## 2014-04-02 ENCOUNTER — Ambulatory Visit (INDEPENDENT_AMBULATORY_CARE_PROVIDER_SITE_OTHER): Payer: BC Managed Care – PPO | Admitting: Emergency Medicine

## 2014-04-02 VITALS — BP 142/100 | HR 70 | Temp 98.6°F | Resp 18 | Ht 70.0 in | Wt 248.0 lb

## 2014-04-02 DIAGNOSIS — E119 Type 2 diabetes mellitus without complications: Secondary | ICD-10-CM

## 2014-04-02 DIAGNOSIS — I10 Essential (primary) hypertension: Secondary | ICD-10-CM

## 2014-04-02 DIAGNOSIS — E291 Testicular hypofunction: Secondary | ICD-10-CM

## 2014-04-02 DIAGNOSIS — E782 Mixed hyperlipidemia: Secondary | ICD-10-CM

## 2014-04-02 LAB — CBC WITH DIFFERENTIAL/PLATELET
Basophils Absolute: 0 10*3/uL (ref 0.0–0.1)
Basophils Relative: 0 % (ref 0–1)
Eosinophils Absolute: 0.1 10*3/uL (ref 0.0–0.7)
Eosinophils Relative: 2 % (ref 0–5)
HCT: 57.7 % — ABNORMAL HIGH (ref 39.0–52.0)
Hemoglobin: 19.8 g/dL — ABNORMAL HIGH (ref 13.0–17.0)
Lymphocytes Relative: 53 % — ABNORMAL HIGH (ref 12–46)
Lymphs Abs: 3.3 10*3/uL (ref 0.7–4.0)
MCH: 29.6 pg (ref 26.0–34.0)
MCHC: 34.3 g/dL (ref 30.0–36.0)
MCV: 86.1 fL (ref 78.0–100.0)
Monocytes Absolute: 0.7 10*3/uL (ref 0.1–1.0)
Monocytes Relative: 12 % (ref 3–12)
Neutro Abs: 2 10*3/uL (ref 1.7–7.7)
Neutrophils Relative %: 33 % — ABNORMAL LOW (ref 43–77)
Platelets: 207 10*3/uL (ref 150–400)
RBC: 6.7 MIL/uL — ABNORMAL HIGH (ref 4.22–5.81)
RDW: 15.1 % (ref 11.5–15.5)
WBC: 6.2 10*3/uL (ref 4.0–10.5)

## 2014-04-02 LAB — HEPATIC FUNCTION PANEL
ALT: 20 U/L (ref 0–53)
AST: 18 U/L (ref 0–37)
Albumin: 4.5 g/dL (ref 3.5–5.2)
Alkaline Phosphatase: 55 U/L (ref 39–117)
Bilirubin, Direct: 0.2 mg/dL (ref 0.0–0.3)
Indirect Bilirubin: 0.6 mg/dL (ref 0.2–1.2)
Total Bilirubin: 0.8 mg/dL (ref 0.2–1.2)
Total Protein: 7.5 g/dL (ref 6.0–8.3)

## 2014-04-02 LAB — LIPID PANEL
Cholesterol: 132 mg/dL (ref 0–200)
HDL: 28 mg/dL — ABNORMAL LOW (ref >39)
LDL Cholesterol: 80 mg/dL (ref 0–99)
Total CHOL/HDL Ratio: 4.7 Ratio
Triglycerides: 119 mg/dL (ref <150)
VLDL: 24 mg/dL (ref 0–40)

## 2014-04-02 LAB — BASIC METABOLIC PANEL WITH GFR
BUN: 19 mg/dL (ref 6–23)
CO2: 25 mEq/L (ref 19–32)
Calcium: 9.9 mg/dL (ref 8.4–10.5)
Chloride: 96 mEq/L (ref 96–112)
Creat: 1.23 mg/dL (ref 0.50–1.35)
GFR, Est African American: 78 mL/min
GFR, Est Non African American: 68 mL/min
Glucose, Bld: 143 mg/dL — ABNORMAL HIGH (ref 70–99)
Potassium: 4.4 mEq/L (ref 3.5–5.3)
Sodium: 135 mEq/L (ref 135–145)

## 2014-04-02 LAB — HEMOGLOBIN A1C
Hgb A1c MFr Bld: 7.4 % — ABNORMAL HIGH (ref <5.7)
Mean Plasma Glucose: 166 mg/dL — ABNORMAL HIGH (ref <117)

## 2014-04-02 MED ORDER — TESTOSTERONE CYPIONATE 200 MG/ML IM SOLN: 400.0000 mg | Freq: Once | INTRAMUSCULAR | Status: DC

## 2014-04-02 NOTE — Progress Notes (Addendum)
Subjective:    Patient ID: Russell [name], male    DOB: 12/23/1960, 53 y.o.   MRN: [id number]  HPI Comments: 47 AAM presents for 3 month F/U for HTN, Cholesterol, DM, D.  He has not been eating as healthy or exercising as routinely with increased stress with daughter and wife. He is up 13 # he has been off phentermine x 4 months he was on RX x 10 months. He notes BS and BP has been up with stress and weight gain. He notes recurrent yeast infection with Invokana. Last labs  HGBA1C      8.0   01/06/2014 CREATININE     1.13   01/06/2014 BUN              21   01/06/2014 NA              140   01/06/2014 K               4.3   01/06/2014 CL               98   01/06/2014 CO2              31   01/06/2014 CHOL         136   01/06/2014 HDL           31   01/06/2014 LDLCALC       87   01/06/2014 TRIG          89   01/06/2014 CHOLHDL      4.4   01/06/2014 ALT           27   01/06/2014 AST           25   01/06/2014 ALKPHOS       55   01/06/2014 BILITOT      0.9   01/06/2014 WBC      7.4   01/06/2014 HGB     18.4   01/06/2014 HCT     53.4   01/06/2014 MCV     81.5   01/06/2014 PLT      333   01/06/2014    Hyperlipidemia  Hypertension  Diabetes Hypoglycemia symptoms include nervousness/anxiousness.      Medication List       This list is accurate as of: 04/02/14  9:50 AM.  Always use your most recent med list.               aspirin 81 MG tablet  Take 81 mg by mouth 2 (two) times daily.     atorvastatin 40 MG tablet  Commonly known as:  LIPITOR  Take 1 tablet (40 mg total) by mouth daily. 1/2     bisoprolol-hydrochlorothiazide 5-6.25 MG per tablet  Commonly known as:  ZIAC  Take 1 tablet by mouth daily.     ciclesonide 50 MCG/ACT nasal spray  Commonly known as:  OMNARIS  Place 2 sprays into both nostrils daily.     hydrochlorothiazide 12.5 MG capsule  Commonly known as:  MICROZIDE  TAKE ONE CAPSULE BY MOUTH EVERY DAY     insulin detemir 100 UNIT/ML injection  Commonly known as:   LEVEMIR  Inject 15 Units into the skin at bedtime.     Insulin Pen Needle 31G X 5 MM Misc  1 qd     INVOKANA 300 MG Tabs  Generic drug:  Canagliflozin  TAKE 1 TABLET BY MOUTH EVERY DAY     lansoprazole  30 MG capsule  Commonly known as:  PREVACID  Take 30 mg by mouth daily at 12 noon.     losartan 100 MG tablet  Commonly known as:  COZAAR  Take 1 tablet (100 mg total) by mouth daily.     metFORMIN 500 MG tablet  Commonly known as:  GLUCOPHAGE  Take 1,000 mg by mouth 2 (two) times daily with a meal.     testosterone cypionate 200 MG/ML injection  Commonly known as:  DEPOTESTOTERONE CYPIONATE  Inject 2 mLs (400 mg total) into the muscle every 14 (fourteen) days.     Vitamin D (Ergocalciferol) 50000 UNITS Caps capsule  Commonly known as:  DRISDOL  Take 50,000 Units by mouth. 2 X WEEK     ZETIA 10 MG tablet  Generic drug:  ezetimibe  TAKE 1 TABLET DAILY FOR CHOLESTEROL       Allergies  Allergen Reactions  . 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 Hypertension   . Hyperlipidemia   . Diabetes mellitus   . Hypogonadism male   . Chronic sinusitis   . Allergy   . Allergic rhinitis      Review of Systems  Psychiatric/Behavioral: Positive for agitation. The patient is nervous/anxious.   All other systems reviewed and are negative.  BP 142/100  Pulse 70  Temp(Src) 98.6 F (37 C) (Temporal)  Resp 18  Ht 5\' 10"  (1.778 m)  Wt 248 lb (112.492 kg)  BMI 35.58 kg/m2  recheck 138/90    Objective:   Physical Exam  Nursing note and vitals reviewed. Constitutional: He is oriented to person, place, and time. He appears well-developed and well-nourished.  HENT:  Head: Normocephalic and atraumatic.  Right Ear: External ear normal.  Left Ear: External ear normal.  Nose: Nose normal.  Eyes: Conjunctivae and EOM are normal.  Neck: Normal range of motion.  Neck supple. No JVD present. No thyromegaly present.  Cardiovascular: Normal rate, regular rhythm, normal heart sounds and intact distal pulses.   Pulmonary/Chest: Effort normal and breath sounds normal.  Abdominal: Soft. Bowel sounds are normal. He exhibits no distension and no mass. There is no tenderness. There is no rebound and no guarding.  Musculoskeletal: Normal range of motion. He exhibits no edema and no tenderness.  Lymphadenopathy:    He has no cervical adenopathy.  Neurological: He is alert and oriented to person, place, and time. He has normal reflexes. No cranial nerve deficit. Coordination normal.  Skin: Skin is warm and dry.  Psychiatric: He has a normal mood and affect. His behavior is normal. Judgment and thought content normal.  Mildly agitated, Tearful but appropriate           Assessment & Plan:  1.  3 month F/U for HTN, Cholesterol,DM, D. Deficient. Needs healthy diet, cardio QD and obtain healthy weight. Check Labs, Check BP if >130/80 call office, Check BS if >200 call office. Add Januvia 100 mg 1 qd SX #28 given  Tanezum SX when they arrive at 2 week NV Pen use illustrated with demo. Patient will D/C Invokana at that time.  2. Stress with increase in BP- declines rx, w/c if desires or will seek counseling.

## 2014-04-02 NOTE — Patient Instructions (Signed)
   Bad carbs also include fruit juice, alcohol, and sweet tea. These are empty calories that do not signal to your brain that you are full.   Please remember the good carbs are still carbs which convert into sugar. So please measure them out no more than 1/2-1 cup of rice, oatmeal, pasta, and beans.  Veggies are however free foods! Pile them on.   I like lean protein at every meal such as chicken, turkey, pork chops, cottage cheese, etc. Just do not fry these meats and please center your meal around vegetable, the meats should be a side dish.   No all fruit is created equal. Please see the list below, the fruit at the bottom is higher in sugars than the fruit at the top   We want weight loss that will last so you should lose 1-2 pounds a week.  THAT IS IT! Please pick THREE things a month to change. Once it is a habit check off the item. Then pick another three items off the list to become habits.  If you are already doing a habit on the list GREAT!  Cross that item off! o Don't drink your calories. Ie, alcohol, soda, fruit juice, and sweet tea.  o Drink more water. Drink a glass when you feel hungry or before each meal.  o Eat breakfast - Complex carb and protein (likeDannon light and fit yogurt, oatmeal, fruit, eggs, turkey bacon). o Measure your cereal.  Eat no more than one cup a day. (ie Kashi) o Eat an apple a day. o Add a vegetable a day. o Try a new vegetable a month. o Use Pam! Stop using oil or butter to cook. o Don't finish your plate or use smaller plates. o Share your dessert. o Eat sugar free Jello for dessert or frozen grapes. o Don't eat 2-3 hours before bed. o Switch to whole wheat bread, pasta, and brown rice. o Make healthier choices when you eat out. No fries! o Pick baked chicken, NOT fried. o Don't forget to SLOW DOWN when you eat. It is not going anywhere.  o Take the stairs. o Park far away in the parking lot o Lift soup cans (or weights) for 10 minutes while  watching TV. o Walk at work for 10 minutes during break. o Walk outside 1 time a week with your friend, kids, dog, or significant other. o Start a walking group at church. o Walk the mall as much as you can tolerate.  o Keep a food diary. o Weigh yourself daily. o Walk for 15 minutes 3 days per week. o Cook at home more often and eat out less.  If life happens and you go back to old habits, it is okay.  Just start over. You can do it!   If you experience chest pain, get short of breath, or tired during the exercise, please stop immediately and inform your doctor.   

## 2014-04-09 ENCOUNTER — Other Ambulatory Visit: Payer: Self-pay | Admitting: Internal Medicine

## 2014-04-12 ENCOUNTER — Other Ambulatory Visit: Payer: Self-pay | Admitting: Internal Medicine

## 2014-04-14 ENCOUNTER — Other Ambulatory Visit: Payer: Self-pay | Admitting: Physician Assistant

## 2014-04-14 MED ORDER — SILDENAFIL CITRATE 100 MG PO TABS
100.0000 mg | ORAL_TABLET | ORAL | Status: DC | PRN
Start: 2014-04-14 — End: 2014-06-18

## 2014-04-23 ENCOUNTER — Ambulatory Visit (INDEPENDENT_AMBULATORY_CARE_PROVIDER_SITE_OTHER): Payer: BC Managed Care – PPO

## 2014-04-23 DIAGNOSIS — E291 Testicular hypofunction: Secondary | ICD-10-CM

## 2014-04-23 NOTE — Progress Notes (Signed)
Patient ID: Russell [name], male   DOB: 08-14-1961, 53 y.o.   MRN: [id number] Patient here today for testosterone injection, patient received 2.0 ml IM right glut. Patient tolerated well.

## 2014-05-07 ENCOUNTER — Ambulatory Visit (INDEPENDENT_AMBULATORY_CARE_PROVIDER_SITE_OTHER): Payer: BC Managed Care – PPO | Admitting: *Deleted

## 2014-05-07 DIAGNOSIS — E291 Testicular hypofunction: Secondary | ICD-10-CM

## 2014-05-07 NOTE — Progress Notes (Signed)
Patient ID: Russell [name], male   DOB: Apr 19, 1961, 53 y.o.   MRN: [id number] [place] presents for testosterone injection.  Area was cleaned with alcohol and patient received 2 cc IM left glute.  Patient tolerated well.

## 2014-05-21 ENCOUNTER — Ambulatory Visit (INDEPENDENT_AMBULATORY_CARE_PROVIDER_SITE_OTHER): Payer: BC Managed Care – PPO

## 2014-05-21 DIAGNOSIS — E291 Testicular hypofunction: Secondary | ICD-10-CM

## 2014-06-04 ENCOUNTER — Ambulatory Visit (INDEPENDENT_AMBULATORY_CARE_PROVIDER_SITE_OTHER): Payer: BC Managed Care – PPO | Admitting: *Deleted

## 2014-06-04 DIAGNOSIS — E291 Testicular hypofunction: Secondary | ICD-10-CM

## 2014-06-04 DIAGNOSIS — E119 Type 2 diabetes mellitus without complications: Secondary | ICD-10-CM

## 2014-06-04 LAB — BASIC METABOLIC PANEL WITHOUT GFR
BUN: 13 mg/dL (ref 6–23)
CO2: 27 meq/L (ref 19–32)
Calcium: 9.5 mg/dL (ref 8.4–10.5)
Chloride: 99 meq/L (ref 96–112)
Creat: 1.07 mg/dL (ref 0.50–1.35)
GFR, Est African American: >89 mL/min
GFR, Est Non African American: 80 mL/min
Glucose, Bld: 194 mg/dL — ABNORMAL HIGH (ref 70–99)
Potassium: 4.3 meq/L (ref 3.5–5.3)
Sodium: 136 meq/L (ref 135–145)

## 2014-06-04 MED ORDER — ALBIGLUTIDE 30 MG ~~LOC~~ PEN: PEN_INJECTOR | SUBCUTANEOUS | Status: DC

## 2014-06-07 ENCOUNTER — Telehealth: Payer: Self-pay

## 2014-06-07 NOTE — Telephone Encounter (Signed)
Left message for patient to return my call for lab results. 

## 2014-06-07 NOTE — Telephone Encounter (Signed)
Message copied by Nadyne Coombes on Mon Jun 07, 2014 11:27 AM ------      Message from: [place], Lenna Sciara R      Created: Sun Jun 06, 2014  4:05 PM       BS still high. He needs to improve diet and weight loss. Continue RX AD ------

## 2014-06-18 ENCOUNTER — Encounter: Payer: Self-pay | Admitting: Internal Medicine

## 2014-06-18 ENCOUNTER — Ambulatory Visit (INDEPENDENT_AMBULATORY_CARE_PROVIDER_SITE_OTHER): Payer: BC Managed Care – PPO | Admitting: *Deleted

## 2014-06-18 ENCOUNTER — Ambulatory Visit (INDEPENDENT_AMBULATORY_CARE_PROVIDER_SITE_OTHER): Payer: BC Managed Care – PPO | Admitting: Internal Medicine

## 2014-06-18 VITALS — BP 112/64 | HR 77 | Ht 69.0 in | Wt 250.0 lb

## 2014-06-18 DIAGNOSIS — E291 Testicular hypofunction: Secondary | ICD-10-CM

## 2014-06-18 DIAGNOSIS — G4733 Obstructive sleep apnea (adult) (pediatric): Secondary | ICD-10-CM

## 2014-06-18 DIAGNOSIS — J310 Chronic rhinitis: Secondary | ICD-10-CM

## 2014-06-18 MED ORDER — TESTOSTERONE CYPIONATE 200 MG/ML IM SOLN
400.0000 mg | Freq: Once | INTRAMUSCULAR | Status: AC
  Administered 2014-06-18: 400 mg via INTRAMUSCULAR

## 2014-06-18 NOTE — Progress Notes (Signed)
10/09/12- 54 yoM former smoker referred for allergy consultation, courtesy of Dr Melford Aase He had been referred to ENT, Dr Radene Journey, who prescribed Omnaris, Neti pot and suggested Allegra.. has had eustachian dysfunction for many years. Perennial nasal congestion and postnasal drainage. Occasionally nose burns and he will sneeze. Uses Afrin at bedtime. ENT surgery only for wisdom teeth. Eating and temperature changes cause watery rhinorrhea and salivation. Loud snoring. Feels better when he is closer to the beach. Lots of sinus congestion and drainage. Sputum is clear in color. Quit smoking 2008. 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 Sometimes environment at work is dirty.  11/10/12- 47 yoM former smoker referred for allergic rhinitis       PCP Dr Melford Aase FOLLOWS FOR: used Dymista and had good relief from it. After he ran out of medication he continued to have "swelling" in his sinuses. Wife is here and says husband is a loud snorer with witnessed apnea. He falls asleep watching TV. We discussed sleep apnea. Dymista nasal spray helps nose but did not help his snoring. He elevates the head with pillows to control reflux. Previously tried on Omnaris and Nasonex. Allergy Profile 09/09/2012-total IgE 16.5, no specific elevations  12/26/12 80 yoM former smoker referred for allergic rhinitis, OSA       PCP Dr Melford Aase FOLLOWS FOR: Never had Home sleep study due to never being called  about getting machine from our office. Dymista nasal spray  does work for his nasal congestion but he doesn't like the taste. Allergy Profile- 10/09/2012-negative. Total IgE 16.5 with no specific allergen elevations.  02/06/13- 23 yoM former smoker referred for allergic rhinitis, OSA       PCP Dr Melford Aase FOLLOWS FOR: review home sleep study with patient. Doing welll with Nasalcrom nasal spray Unattended Home Sleep Study 01/07/13- severe OSA AHI 35.3/ hr We reviewed the study, medical implications and treatment  options. Nasalcrom helps his nose some. Prefers to sleep on stomach.  He is willing to try CPAP- to start w/ autotitration.   03/20/13- 62 yoM former smoker referred for allergic rhinitis, OSA       PCP Dr Melford Aase FOLLOWS WNI:[id number] use to CPAP -will get download from [facility] once patient is at home to help with download. Feels better but only sleeping 4-5 hours a night. He feels he is getting used to CPAP. He wakes after sleeping for 5 hours which is his traditional pattern, and that he is getting enough sleep. No longer snores. Seasonal pollen rhinitis for the past week, using Claritin and occasional Afrin. He also uses Omnaris nasal spray each morning and Dymista nasal spray at night. I discussed this.  05/08/13-  40 yoM former smoker referred for allergic rhinitis, OSA       PCP Dr Melford Aase FOLLOWS FOR: wears CPAP/ [facility]ap [facility] every night for about 4 -6 hours; pressure works well for patient. Download indicates he is not using it does not each night. Pressure of 9 would give good control. We discussed compliance and comfort. He indicates he is doing much better now. His wife has told him he breathes comfortably without snoring using CPAP  12/15/13- 72 yoM former smoker referred for allergic rhinitis, OSA       PCP Dr Melford Aase FOLLOWS FOR: CPAP needs to be repaired-broken for about 3-4 weeks; calling [facility] today to get that fixed.  He has lost 11 pounds since April, on phentermine from  PCP. We discussed weight loss as a potential way  to get free  of CPAP. He denies daytime sleepiness without the mask currently.  06/18/14-52 yoM former smoker referred for allergic rhinitis, OSA       PCP Dr Melford Aase FOLLOWS FOR: Wears CPAP/ Auto most every night for about 3-4 hours; DME is [facility] Discussed compliance goals and control. Mild nasal stuffiness now after spring pollen season, without sneezing.  ROS-see HPI Constitutional:   +weight loss, night sweats, fevers, chills, fatigue, lassitude. HEENT:   No-   headaches, difficulty swallowing, tooth/dental problems, sore throat,       No-  sneezing, itching, ear ache, +nasal congestion, post nasal drip,  CV:  No-   chest pain, orthopnea, PND, swelling in lower extremities, anasarca, dizziness, palpitations Resp: No-   shortness of breath with exertion or at rest.              No-   productive cough,  No non-productive cough,  No- coughing up of blood.              No-   change in color of mucus.  No- wheezing.   Skin: No-   rash or lesions. GI:  No-   heartburn, indigestion, abdominal pain, nausea,  GU:. MS:  No-   joint pain or swelling.   Neuro-     nothing unusual Psych:  No- change in mood or affect. No depression or anxiety.  No memory loss.  OBJ- Physical Exam General- Alert, Oriented, Affect-appropriate, Distress- none acute, appears fit Skin- rash-none, lesions- none, excoriation- none Lymphadenopathy- none Head- atraumatic            Eyes- Gross vision intact, PERRLA, conjunctivae and secretions clear            Ears- Hearing, canals-normal            Nose- clear, no-Septal dev,  polyps, erosion, perforation             Throat- Mallampati II-III , mucosa clear , drainage- none, tonsils- atrophic Neck- flexible , trachea midline, no stridor , thyroid nl, carotid no bruit Chest - symmetrical excursion , unlabored           Heart/CV- RRR , no murmur , no gallop  , no rub, nl s1 s2                           - JVD- none , edema- none, stasis changes- none, varices- none           Lung- clear to P&A, wheeze- none, cough- none , dullness-none, rub- none           Chest wall-  Abd-  Br/ Gen/ Rectal- Not done, not indicated Extrem- cyanosis- none, clubbing, none, atrophy- none, strength- nl Neuro- grossly intact to observation

## 2014-06-18 NOTE — Progress Notes (Signed)
Patient ID: Russell [name], male   DOB: May 11, 1961, 53 y.o.   MRN: [id number] Patient presents today for NV for testosterone injection for hypogonadism.  Area was cleaned with alcohol and patient received 2 cc IM right glute.  Patient tolerated well.

## 2014-06-18 NOTE — Assessment & Plan Note (Signed)
Plan-contact DME company for download to guide fixed pressure setting. Initial compliance and control seem good.

## 2014-06-18 NOTE — Assessment & Plan Note (Signed)
Plan-try sample Dymista nasal spray. He may be a good candidate for a trial of ipratropium later

## 2014-06-18 NOTE — Patient Instructions (Signed)
Sample Dymista nasal spray   1-2 puffs each nostril once daily at bedtime  We will check on your CPAP pressure and may get a download report from them  Elevating the head of the bed on pillows or with a brick under the head legs of the bed, may help  Try using a Breathe Right strip under your CPAP mask

## 2014-06-22 ENCOUNTER — Other Ambulatory Visit: Payer: Self-pay | Admitting: Emergency Medicine

## 2014-07-02 ENCOUNTER — Ambulatory Visit (INDEPENDENT_AMBULATORY_CARE_PROVIDER_SITE_OTHER): Payer: BC Managed Care – PPO | Admitting: Internal Medicine

## 2014-07-02 ENCOUNTER — Encounter: Payer: Self-pay | Admitting: Internal Medicine

## 2014-07-02 VITALS — BP 136/96 | HR 84 | Temp 98.6°F | Resp 18 | Ht 70.0 in | Wt 252.0 lb

## 2014-07-02 DIAGNOSIS — E559 Vitamin D deficiency, unspecified: Secondary | ICD-10-CM

## 2014-07-02 DIAGNOSIS — E785 Hyperlipidemia, unspecified: Secondary | ICD-10-CM

## 2014-07-02 DIAGNOSIS — E119 Type 2 diabetes mellitus without complications: Secondary | ICD-10-CM

## 2014-07-02 DIAGNOSIS — E291 Testicular hypofunction: Secondary | ICD-10-CM

## 2014-07-02 DIAGNOSIS — I10 Essential (primary) hypertension: Secondary | ICD-10-CM

## 2014-07-02 DIAGNOSIS — Z79899 Other long term (current) drug therapy: Secondary | ICD-10-CM

## 2014-07-02 LAB — CBC WITH DIFFERENTIAL/PLATELET
Basophils Absolute: 0 10*3/uL (ref 0.0–0.1)
Basophils Relative: 0 % (ref 0–1)
Eosinophils Absolute: 0.1 10*3/uL (ref 0.0–0.7)
Eosinophils Relative: 2 % (ref 0–5)
HCT: 54.5 % — ABNORMAL HIGH (ref 39.0–52.0)
Hemoglobin: 19 g/dL — ABNORMAL HIGH (ref 13.0–17.0)
Lymphocytes Relative: 45 % (ref 12–46)
Lymphs Abs: 3.1 10*3/uL (ref 0.7–4.0)
MCH: 28.9 pg (ref 26.0–34.0)
MCHC: 34.9 g/dL (ref 30.0–36.0)
MCV: 83 fL (ref 78.0–100.0)
Monocytes Absolute: 1 10*3/uL (ref 0.1–1.0)
Monocytes Relative: 14 % — ABNORMAL HIGH (ref 3–12)
Neutro Abs: 2.7 10*3/uL (ref 1.7–7.7)
Neutrophils Relative %: 39 % — ABNORMAL LOW (ref 43–77)
Platelets: 248 10*3/uL (ref 150–400)
RBC: 6.57 MIL/uL — ABNORMAL HIGH (ref 4.22–5.81)
RDW: 15 % (ref 11.5–15.5)
WBC: 6.8 10*3/uL (ref 4.0–10.5)

## 2014-07-02 LAB — HEMOGLOBIN A1C
Hgb A1c MFr Bld: 9.9 % — ABNORMAL HIGH (ref <5.7)
Mean Plasma Glucose: 237 mg/dL — ABNORMAL HIGH (ref <117)

## 2014-07-02 LAB — BASIC METABOLIC PANEL WITH GFR
BUN: 16 mg/dL (ref 6–23)
CO2: 26 mEq/L (ref 19–32)
Calcium: 9.6 mg/dL (ref 8.4–10.5)
Chloride: 98 mEq/L (ref 96–112)
Creat: 1.22 mg/dL (ref 0.50–1.35)
GFR, Est African American: 79 mL/min
GFR, Est Non African American: 68 mL/min
Glucose, Bld: 225 mg/dL — ABNORMAL HIGH (ref 70–99)
Potassium: 4.5 mEq/L (ref 3.5–5.3)
Sodium: 135 mEq/L (ref 135–145)

## 2014-07-02 LAB — LIPID PANEL
Cholesterol: 132 mg/dL (ref 0–200)
HDL: 31 mg/dL — ABNORMAL LOW (ref >39)
LDL Cholesterol: 64 mg/dL (ref 0–99)
Total CHOL/HDL Ratio: 4.3 Ratio
Triglycerides: 187 mg/dL — ABNORMAL HIGH (ref <150)
VLDL: 37 mg/dL (ref 0–40)

## 2014-07-02 LAB — HEPATIC FUNCTION PANEL
ALT: 17 U/L (ref 0–53)
AST: 13 U/L (ref 0–37)
Albumin: 4.3 g/dL (ref 3.5–5.2)
Alkaline Phosphatase: 60 U/L (ref 39–117)
Bilirubin, Direct: 0.2 mg/dL (ref 0.0–0.3)
Indirect Bilirubin: 0.7 mg/dL (ref 0.2–1.2)
Total Bilirubin: 0.9 mg/dL (ref 0.2–1.2)
Total Protein: 7.2 g/dL (ref 6.0–8.3)

## 2014-07-02 LAB — TSH: TSH: 1.336 u[IU]/mL (ref 0.350–4.500)

## 2014-07-02 LAB — MAGNESIUM: Magnesium: 1.7 mg/dL (ref 1.5–2.5)

## 2014-07-02 MED ORDER — TESTOSTERONE CYPIONATE 200 MG/ML IM SOLN
400.0000 mg | Freq: Once | INTRAMUSCULAR | Status: AC
  Administered 2014-07-02: 400 mg via INTRAMUSCULAR

## 2014-07-02 NOTE — Patient Instructions (Signed)

## 2014-07-02 NOTE — Progress Notes (Signed)
Patient ID: Russell [name], male   DOB: May 30, 1961, 53 y.o.   MRN: [id number]   This very nice 53 y.o.MBM presents for 3 month follow up with Hypertension, Hyperlipidemia, T2_NIDDM and [name].    HTN predates since 2000. BP has been controlled at home. Today's BP: 136/96 mmHg. Patient denies any cardiac type chest pain, palpitations, dyspnea/orthopnea/PND, dizziness, claudication, or dependent edema.   Hyperlipidemia is controlled with diet & meds. Patient denies myalgias or other med SE's. Last Lipids wereat goal. 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 Patient reports CBG's ranged less than 150. He does describe what sounds like hypoglygemis episodes rescued by food intake. He denies any diabetic polys, paresthesias or visual blurring.   Further, Patient has history of [name] of 28 in 2009  and last vitamin D was 104 in Jan 2015.  Patient supplements vitamin D without any suspected side-effects.   Medication List   Albiglutide 30 MG Pen  Commonly known as:  TANZEUM  USE AD     aspirin 81 MG tablet  Take 81 mg by mouth daily.     atorvastatin 40 MG tablet  Commonly known as:  LIPITOR  Take 20 mg by mouth daily.     bisoprolol-hydrochlorothiazide 5-6.25 MG per tablet  Commonly known as:  ZIAC  Take 1 tablet by mouth daily.     CIALIS 20 MG tablet  Generic drug:  tadalafil  TAKE 1/2 TO 1 TABLET EVERY 2 TO 3 DAYS AS NEEDED     ciclesonide 50 MCG/ACT nasal spray  Commonly known as:  OMNARIS  Place 2 sprays into both nostrils daily.     hydrochlorothiazide 12.5 MG capsule  Commonly known as:  MICROZIDE  TAKE ONE CAPSULE BY MOUTH EVERY DAY     insulin detemir 100 UNIT/ML injection  Commonly known as:  LEVEMIR  Inject 15 Units  into the skin at bedtime.     Insulin Pen Needle 31G X 5 MM Misc  1 qd     INVOKANA 300 MG Tabs  Generic drug:  Canagliflozin  TAKE 1 TABLET BY MOUTH EVERY DAY     losartan 100 MG tablet  Commonly known as:  COZAAR  Take 1 tablet (100 mg total) by mouth daily.     metFORMIN 500 MG tablet  Commonly known as:  GLUCOPHAGE  Take 1,000 mg by mouth 2 (two) times daily with a meal.     testosterone cypionate 200 MG/ML injection  Commonly known as:  DEPOTESTOTERONE CYPIONATE  Inject 2 mLs (400 mg total) into the muscle every 14 (fourteen) days.     Vitamin D (Ergocalciferol) 50000 UNITS Caps capsule  Commonly known as:  DRISDOL  Take 50,000 Units by mouth. 2 X WEEK     ZETIA 10 MG tablet  Generic drug:  ezetimibe  TAKE 1 TABLET DAILY FOR CHOLESTEROL        Allergies  Allergen Reactions  . Ppd [Tuberculin Purified Protein Derivative] Other (See Comments)    POSITIVE REACTION IN PAST 11/21/11 CXR NEG   PMHx:   Past Medical History  Diagnosis Date  . Hypertension   . Hyperlipidemia   . Diabetes mellitus   . Hypogonadism male   .  Chronic sinusitis   . Allergy   . Allergic rhinitis    FHx:    Reviewed / unchanged SHx:    Reviewed / unchanged  Systems Review:  Constitutional: Denies fever, chills, wt changes, headaches, insomnia, fatigue, night sweats, change in appetite. Eyes: Denies redness, blurred vision, diplopia, discharge, itchy, watery eyes.  ENT: Denies discharge, congestion, post nasal drip, epistaxis, sore throat, earache, hearing loss, dental pain, tinnitus, vertigo, sinus pain, snoring.  CV: Denies chest pain, palpitations, irregular heartbeat, syncope, dyspnea, diaphoresis, orthopnea, PND, claudication or edema. Respiratory: denies cough, dyspnea, DOE, pleurisy, hoarseness, laryngitis, wheezing.  Gastrointestinal: Denies dysphagia, odynophagia, heartburn, reflux, water brash, abdominal pain or cramps, nausea, vomiting, bloating, diarrhea, constipation,  hematemesis, melena, hematochezia  or hemorrhoids. Genitourinary: Denies dysuria, frequency, urgency, nocturia, hesitancy, discharge, hematuria or flank pain. Musculoskeletal: Denies arthralgias, myalgias, stiffness, jt. swelling, pain, limping or strain/sprain.  Skin: Denies pruritus, rash, hives, warts, acne, eczema or change in skin lesion(s). Neuro: No weakness, tremor, incoordination, spasms, paresthesia or pain. Psychiatric: Denies confusion, memory loss or sensory loss. Endo: Denies change in weight, skin or hair change.  Heme/Lymph: No excessive bleeding, bruising or enlarged lymph nodes.  Exam:  BP 136/96  Pulse 84  Temp(Src) 98.6 F (37 C) (Temporal)  Resp 18  Ht 5\' 10"  (1.778 m)  Wt 252 lb (114.306 kg)  BMI 36.16 kg/m2  Appears well nourished and in no distress. Eyes: PERRLA, EOMs, conjunctiva no swelling or erythema. Sinuses: No frontal/maxillary tenderness ENT/Mouth: EAC's clear, TM's nl w/o erythema, bulging. Nares clear w/o erythema, swelling, exudates. Oropharynx clear without erythema or exudates. Oral hygiene is good. Tongue normal, non obstructing. Hearing intact.  Neck: Supple. Thyroid nl. Car 2+/2+ without bruits, nodes or JVD. Chest: Respirations nl with BS clear & equal w/o rales, rhonchi, wheezing or stridor.  Cor: Heart sounds normal w/ regular rate and rhythm without sig. murmurs, gallops, clicks, or rubs. Peripheral pulses normal and equal  without edema.  Abdomen: Soft & bowel sounds normal. Non-tender w/o guarding, rebound, hernias, masses, or organomegaly.  Lymphatics: Unremarkable.  Musculoskeletal: Full ROM all peripheral extremities, joint stability, 5/5 strength, and normal gait.  Skin: Warm, dry without exposed rashes, lesions or ecchymosis apparent.  Neuro: Cranial nerves intact, reflexes equal bilaterally. Sensory-motor testing grossly intact. Tendon reflexes grossly intact.  Pysch: Alert & oriented x 3. Insight and judgement nl & appropriate. No  ideations.  Assessment and Plan:  1. Hypertension - Continue monitor blood pressure at home. Continue diet/meds same.  2. Hyperlipidemia - Continue diet/meds, exercise,& lifestyle modifications. Continue monitor periodic cholesterol/liver & renal functions   3. T2_NIDDM -  Long discussion w/pt [id number] diet for wt loss to allow tapering of meds and encouraged to first taper off Tanzeum due to cost and then begin tapering off insulin to allow weight loss.  4. [name] - Continue supplementation.  Recommended regular exercise, BP monitoring, weight control, and discussed med and SE's. Recommended labs to assess and monitor clinical status. Further disposition pending results of labs.

## 2014-07-03 ENCOUNTER — Other Ambulatory Visit: Payer: Self-pay | Admitting: Emergency Medicine

## 2014-07-03 LAB — VITAMIN D 25 HYDROXY (VIT D DEFICIENCY, FRACTURES): Vit D, 25-Hydroxy: 95 ng/mL — ABNORMAL HIGH (ref 30–89)

## 2014-07-03 LAB — TESTOSTERONE: Testosterone: 491 ng/dL (ref 300–890)

## 2014-07-04 ENCOUNTER — Other Ambulatory Visit: Payer: Self-pay | Admitting: Internal Medicine

## 2014-07-04 MED ORDER — HYDROCHLOROTHIAZIDE 25 MG PO TABS: ORAL_TABLET | ORAL | Status: DC

## 2014-07-08 ENCOUNTER — Other Ambulatory Visit: Payer: Self-pay | Admitting: Internal Medicine

## 2014-07-16 ENCOUNTER — Ambulatory Visit (INDEPENDENT_AMBULATORY_CARE_PROVIDER_SITE_OTHER): Payer: BC Managed Care – PPO | Admitting: *Deleted

## 2014-07-16 DIAGNOSIS — E291 Testicular hypofunction: Secondary | ICD-10-CM

## 2014-07-16 MED ORDER — TESTOSTERONE CYPIONATE 200 MG/ML IM SOLN
400.0000 mg | Freq: Once | INTRAMUSCULAR | Status: AC
  Administered 2014-07-16: 400 mg via INTRAMUSCULAR

## 2014-07-16 NOTE — Progress Notes (Signed)
Patient ID: Russell [name], male   DOB: [birth year], 53 y.o.   MRN: [id number] Patient presents for testosterone injection for hypogonadism Tx.  Patient received 2 cc IM right glut.  Patient tolerated well.

## 2014-07-23 ENCOUNTER — Other Ambulatory Visit: Payer: Self-pay | Admitting: Emergency Medicine

## 2014-07-30 ENCOUNTER — Ambulatory Visit (INDEPENDENT_AMBULATORY_CARE_PROVIDER_SITE_OTHER): Payer: BC Managed Care – PPO | Admitting: *Deleted

## 2014-07-30 DIAGNOSIS — E291 Testicular hypofunction: Secondary | ICD-10-CM

## 2014-07-30 MED ORDER — TESTOSTERONE CYPIONATE 200 MG/ML IM SOLN
400.0000 mg | Freq: Once | INTRAMUSCULAR | Status: AC
  Administered 2014-07-30: 400 mg via INTRAMUSCULAR

## 2014-07-30 NOTE — Progress Notes (Signed)
Patient ID: Russell [name], male   DOB: Dec 02, 1961, 53 y.o.   MRN: [id number] Patient presents for testosterone injection for hypogonadism Tx.  Patient received 2 cc IM left glut, tolerated well.

## 2014-08-02 ENCOUNTER — Other Ambulatory Visit: Payer: Self-pay | Admitting: Internal Medicine

## 2014-08-02 ENCOUNTER — Other Ambulatory Visit: Payer: Self-pay | Admitting: Emergency Medicine

## 2014-08-03 ENCOUNTER — Other Ambulatory Visit: Payer: Self-pay | Admitting: Emergency Medicine

## 2014-08-13 ENCOUNTER — Ambulatory Visit (INDEPENDENT_AMBULATORY_CARE_PROVIDER_SITE_OTHER): Payer: BC Managed Care – PPO | Admitting: *Deleted

## 2014-08-13 DIAGNOSIS — E291 Testicular hypofunction: Secondary | ICD-10-CM

## 2014-08-13 MED ORDER — TESTOSTERONE CYPIONATE 200 MG/ML IM SOLN
400.0000 mg | Freq: Once | INTRAMUSCULAR | Status: AC
  Administered 2014-08-13: 400 mg via INTRAMUSCULAR

## 2014-08-13 NOTE — Progress Notes (Signed)
Patient ID: Russell [name], male   DOB: Oct 04, 1961, 54 y.o.   MRN: [id number] Patient presents for testosterone injection for hypogonadism Tx.  Patient received 2 cc IM left glut, patient tolerated well.

## 2014-08-27 ENCOUNTER — Ambulatory Visit (INDEPENDENT_AMBULATORY_CARE_PROVIDER_SITE_OTHER): Payer: BC Managed Care – PPO

## 2014-08-27 DIAGNOSIS — E291 Testicular hypofunction: Secondary | ICD-10-CM

## 2014-08-27 MED ORDER — TESTOSTERONE CYPIONATE 200 MG/ML IM SOLN
400.0000 mg | Freq: Once | INTRAMUSCULAR | Status: AC
  Administered 2014-08-27: 400 mg via INTRAMUSCULAR

## 2014-08-27 NOTE — Progress Notes (Signed)
Patient ID: Russell [name], male   DOB: [birth year], 53 y.o.   MRN: [id number] Patient here today for testosterone injection. Patient received 2.0 ml IM right glut. Patient tolerated well.

## 2014-09-10 ENCOUNTER — Ambulatory Visit (INDEPENDENT_AMBULATORY_CARE_PROVIDER_SITE_OTHER): Payer: BC Managed Care – PPO | Admitting: *Deleted

## 2014-09-10 DIAGNOSIS — E291 Testicular hypofunction: Secondary | ICD-10-CM

## 2014-09-10 MED ORDER — TESTOSTERONE CYPIONATE 200 MG/ML IM SOLN
400.0000 mg | Freq: Once | INTRAMUSCULAR | Status: AC
  Administered 2014-09-10: 400 mg via INTRAMUSCULAR

## 2014-09-10 NOTE — Progress Notes (Signed)
Patient ID: Russell [name], male   DOB: February 27, 1961, 53 y.o.   MRN: [id number] Patient presents for testosterone injection for hypogonadism treatment.  Patient received 2 cc IM left glut.  Patient tolerated well. Will RTC AD for next injection.

## 2014-09-22 ENCOUNTER — Other Ambulatory Visit: Payer: Self-pay | Admitting: Physician Assistant

## 2014-09-22 MED ORDER — AZITHROMYCIN 250 MG PO TABS: ORAL_TABLET | ORAL | Status: AC

## 2014-09-22 MED ORDER — PREDNISONE 20 MG PO TABS: ORAL_TABLET | ORAL | Status: DC

## 2014-09-24 ENCOUNTER — Other Ambulatory Visit: Payer: Self-pay | Admitting: *Deleted

## 2014-09-24 ENCOUNTER — Ambulatory Visit (INDEPENDENT_AMBULATORY_CARE_PROVIDER_SITE_OTHER): Payer: BC Managed Care – PPO | Admitting: *Deleted

## 2014-09-24 DIAGNOSIS — E291 Testicular hypofunction: Secondary | ICD-10-CM

## 2014-09-24 MED ORDER — TESTOSTERONE CYPIONATE 200 MG/ML IM SOLN
400.0000 mg | Freq: Once | INTRAMUSCULAR | Status: AC
  Administered 2014-09-24: 400 mg via INTRAMUSCULAR

## 2014-09-24 MED ORDER — CLOTRIMAZOLE-BETAMETHASONE 1-0.05 % EX CREA: TOPICAL_CREAM | CUTANEOUS | Status: DC

## 2014-09-24 MED ORDER — FLUCONAZOLE 150 MG PO TABS: ORAL_TABLET | ORAL | Status: DC

## 2014-09-24 NOTE — Progress Notes (Signed)
Patient ID: Russell [name], male   DOB: 1961/06/04, 53 y.o.   MRN: [id number] Patient here for Testosterone injection.  Injection in left hip and tolerated well.

## 2014-10-08 ENCOUNTER — Encounter: Payer: Self-pay | Admitting: Physician Assistant

## 2014-10-08 ENCOUNTER — Ambulatory Visit (INDEPENDENT_AMBULATORY_CARE_PROVIDER_SITE_OTHER): Payer: BC Managed Care – PPO | Admitting: Physician Assistant

## 2014-10-08 VITALS — BP 126/78 | HR 88 | Temp 98.2°F | Resp 18 | Ht 70.0 in | Wt 246.0 lb

## 2014-10-08 DIAGNOSIS — I10 Essential (primary) hypertension: Secondary | ICD-10-CM

## 2014-10-08 DIAGNOSIS — Z79899 Other long term (current) drug therapy: Secondary | ICD-10-CM

## 2014-10-08 DIAGNOSIS — E559 Vitamin D deficiency, unspecified: Secondary | ICD-10-CM

## 2014-10-08 DIAGNOSIS — E291 Testicular hypofunction: Secondary | ICD-10-CM

## 2014-10-08 DIAGNOSIS — G4733 Obstructive sleep apnea (adult) (pediatric): Secondary | ICD-10-CM

## 2014-10-08 DIAGNOSIS — E1365 Other specified diabetes mellitus with hyperglycemia: Secondary | ICD-10-CM

## 2014-10-08 DIAGNOSIS — N182 Chronic kidney disease, stage 2 (mild): Secondary | ICD-10-CM

## 2014-10-08 DIAGNOSIS — E785 Hyperlipidemia, unspecified: Secondary | ICD-10-CM

## 2014-10-08 DIAGNOSIS — E1322 Other specified diabetes mellitus with diabetic chronic kidney disease: Secondary | ICD-10-CM

## 2014-10-08 DIAGNOSIS — E1329 Other specified diabetes mellitus with other diabetic kidney complication: Secondary | ICD-10-CM

## 2014-10-08 DIAGNOSIS — IMO0002 Reserved for concepts with insufficient information to code with codable children: Secondary | ICD-10-CM

## 2014-10-08 LAB — CBC WITH DIFFERENTIAL/PLATELET
Basophils Absolute: 0 10*3/uL (ref 0.0–0.1)
Basophils Relative: 0 % (ref 0–1)
Eosinophils Absolute: 0.1 10*3/uL (ref 0.0–0.7)
Eosinophils Relative: 1 % (ref 0–5)
HCT: 57.2 % — ABNORMAL HIGH (ref 39.0–52.0)
Hemoglobin: 18.9 g/dL — ABNORMAL HIGH (ref 13.0–17.0)
Lymphocytes Relative: 43 % (ref 12–46)
Lymphs Abs: 2.5 10*3/uL (ref 0.7–4.0)
MCH: 28.5 pg (ref 26.0–34.0)
MCHC: 33 g/dL (ref 30.0–36.0)
MCV: 86.4 fL (ref 78.0–100.0)
Monocytes Absolute: 0.7 10*3/uL (ref 0.1–1.0)
Monocytes Relative: 12 % (ref 3–12)
Neutro Abs: 2.6 10*3/uL (ref 1.7–7.7)
Neutrophils Relative %: 44 % (ref 43–77)
Platelets: 257 10*3/uL (ref 150–400)
RBC: 6.62 MIL/uL — ABNORMAL HIGH (ref 4.22–5.81)
RDW: 15.1 % (ref 11.5–15.5)
WBC: 5.9 10*3/uL (ref 4.0–10.5)

## 2014-10-08 LAB — BASIC METABOLIC PANEL WITH GFR
BUN: 18 mg/dL (ref 6–23)
CO2: 21 mEq/L (ref 19–32)
Calcium: 9.9 mg/dL (ref 8.4–10.5)
Chloride: 97 mEq/L (ref 96–112)
Creat: 1.09 mg/dL (ref 0.50–1.35)
GFR, Est African American: >89 mL/min
GFR, Est Non African American: 78 mL/min
Glucose, Bld: 208 mg/dL — ABNORMAL HIGH (ref 70–99)
Potassium: 4.4 mEq/L (ref 3.5–5.3)
Sodium: 135 mEq/L (ref 135–145)

## 2014-10-08 LAB — HEPATIC FUNCTION PANEL
ALT: 13 U/L (ref 0–53)
AST: 11 U/L (ref 0–37)
Albumin: 4.3 g/dL (ref 3.5–5.2)
Alkaline Phosphatase: 73 U/L (ref 39–117)
Bilirubin, Direct: 0.1 mg/dL (ref 0.0–0.3)
Indirect Bilirubin: 0.6 mg/dL (ref 0.2–1.2)
Total Bilirubin: 0.7 mg/dL (ref 0.2–1.2)
Total Protein: 7.4 g/dL (ref 6.0–8.3)

## 2014-10-08 LAB — LIPID PANEL
Cholesterol: 226 mg/dL — ABNORMAL HIGH (ref 0–200)
HDL: 34 mg/dL — ABNORMAL LOW (ref >39)
Total CHOL/HDL Ratio: 6.6 Ratio
Triglycerides: 555 mg/dL — ABNORMAL HIGH (ref <150)

## 2014-10-08 LAB — HEMOGLOBIN A1C
Hgb A1c MFr Bld: 10 % — ABNORMAL HIGH (ref <5.7)
Mean Plasma Glucose: 240 mg/dL — ABNORMAL HIGH (ref <117)

## 2014-10-08 LAB — TSH: TSH: 1.011 u[IU]/mL (ref 0.350–4.500)

## 2014-10-08 LAB — MAGNESIUM: Magnesium: 2.2 mg/dL (ref 1.5–2.5)

## 2014-10-08 MED ORDER — TESTOSTERONE CYPIONATE 200 MG/ML IM SOLN
400.0000 mg | Freq: Once | INTRAMUSCULAR | Status: AC
  Administered 2014-10-08: 400 mg via INTRAMUSCULAR

## 2014-10-08 NOTE — Progress Notes (Signed)
Assessment and Plan:  Hypertension: Continue medication, monitor blood pressure at home. Continue DASH diet.  Reminder to go to the ER if any CP, SOB, nausea, dizziness, severe HA, changes vision/speech, left arm numbness and tingling and jaw pain. Cholesterol: Continue diet and exercise. Check cholesterol.  Diabetes with CKD/ED-Continue diet and exercise. Check A1C, long discussion about diet Vitamin D Def- check level and continue medications.  Hypogonadism- continue to monitor, states medication is helping with symptoms of low T.    Continue diet and meds as discussed. Further disposition pending results of labs. Discussed med's effects and SE's.    HPI 53 y.o. male  presents for 3 month follow up with hypertension, hyperlipidemia, diabetes and vitamin D. His blood pressure has been controlled at home, today their BP is BP: 126/78 mmHg He does workout, joined [employer]. He denies chest pain, shortness of breath, dizziness.  He is on cholesterol medication and denies myalgias. His cholesterol is at goal. The cholesterol last visit was:   Lab Results  Component Value Date   CHOL 132 07/02/2014   HDL 31* 07/02/2014   LDLCALC 64 07/02/2014   TRIG 187* 07/02/2014   CHOLHDL 4.3 07/02/2014   He has been working on diet and exercise for Diabetes, he is on bASA, ARB, Invokana, MF, levemir 15 units, sugars AM lowest is 120, average is 150's, he admits to eating poorly the last 2-3 weeks due to football game parties and denies paresthesia of the feet, polydipsia, polyuria and visual disturbances. Last A1C in the office was:  Lab Results  Component Value Date   HGBA1C 9.9* 07/02/2014   Patient is on Vitamin D supplement. Lab Results  Component Value Date   VD25OH 95* 07/02/2014     He has a history of testosterone deficiency and is on testosterone replacement. He states that the testosterone helps with his energy, libido, muscle mass. Lab Results  Component Value Date   TESTOSTERONE 491  07/02/2014     Current Medications:  Current Outpatient Prescriptions on File Prior to Visit  Medication Sig Dispense Refill  . aspirin 81 MG tablet Take 81 mg by mouth daily.       Marland Kitchen atorvastatin (LIPITOR) 40 MG tablet Take 20 mg by mouth daily.      . bisoprolol-hydrochlorothiazide (ZIAC) 10-6.25 MG per tablet TAKE 1 TABLET BY MOUTH EVERY DAY  90 tablet  1  . CIALIS 20 MG tablet TAKE 1/2 TO 1 TABLET EVERY 2 TO 3 DAYS AS NEEDED  10 tablet  0  . ciclesonide (OMNARIS) 50 MCG/ACT nasal spray Place 2 sprays into both nostrils daily.      . clotrimazole-betamethasone (LOTRISONE) cream Apply 3 times a day as needed  15 g  11  . hydrochlorothiazide (HYDRODIURIL) 25 MG tablet Take 1/2 to 1 tablet daily as directed for BP & Fluid  90 tablet  99  . insulin detemir (LEVEMIR) 100 UNIT/ML injection Inject 15 Units into the skin at bedtime.      . Insulin Pen Needle 31G X 5 MM MISC 1 qd  100 each  6  . LEVEMIR FLEXTOUCH 100 UNIT/ML Pen START WITH 10 UNITS AND TITRATE AS DIRECTED  15 mL  6  . losartan (COZAAR) 100 MG tablet TAKE 1 TABLET (100 MG TOTAL) BY MOUTH DAILY.  90 tablet  99  . metFORMIN (GLUCOPHAGE) 500 MG tablet Take 1,000 mg by mouth 2 (two) times daily with a meal.      . testosterone cypionate (DEPOTESTOTERONE CYPIONATE)  200 MG/ML injection Inject 2 mLs (400 mg total) into the muscle every 14 (fourteen) days.  10 mL  1  . Vitamin D, Ergocalciferol, (DRISDOL) 50000 UNITS CAPS capsule TAKE ONE CAPSULE EVERY DAY OR AS DIRECTED  90 capsule  99  . ZETIA 10 MG tablet TAKE 1 TABLET DAILY FOR CHOLESTEROL  30 tablet  3  . fluconazole (DIFLUCAN) 150 MG tablet Take 1 tab by mouth once a week  4 tablet  11   No current facility-administered medications on file prior to visit.   Medical History:  Past Medical History  Diagnosis Date  . Hypertension   . Hyperlipidemia   . Diabetes mellitus   . Hypogonadism male   . Chronic sinusitis   . Allergy   . Allergic rhinitis    Allergies:  Allergies   Allergen Reactions  . Ppd [Tuberculin Purified Protein Derivative] Other (See Comments)    POSITIVE REACTION IN PAST 11/21/11 CXR NEG     Review of Systems: [X]  = complains of  [ ]  = denies  General: Fatigue [ ]  Fever [ ]  Chills [ ]  Weakness [ ]   Insomnia [ ]  Eyes: Redness [ ]  Blurred vision [ ]  Diplopia [ ]   ENT: Congestion [ ]  Sinus Pain [ ]  Post Nasal Drip [ ]  Sore Throat [ ]  Earache [ ]   Cardiac: Chest pain/pressure [ ]  SOB [ ]  Orthopnea [ ]   Palpitations [ ]   Paroxysmal nocturnal dyspnea[ ]  Claudication [ ]  Edema [ ]   Pulmonary: Cough [ ]  Wheezing[ ]   SOB [ ]   Snoring [ ]   GI: Nausea [ ]  Vomiting[ ]  Dysphagia[ ]  Heartburn[ ]  Abdominal pain [ ]  Constipation [ ] ; Diarrhea [ ] ; BRBPR [ ]  Melena[ ]  GU: Hematuria[ ]  Dysuria [ ]  Nocturia[ ]  Urgency [ ]   Hesitancy [ ]  Discharge [ ]  Neuro: Headaches[ ]  Vertigo[ ]  Paresthesias[ ]  Spasm [ ]  Speech changes [ ]  Incoordination [ ]   Ortho: Arthritis [ ]  Joint pain [ ]  Muscle pain [ ]  Joint swelling [ ]  Back Pain [ ]  Skin:  Rash [ ]   Pruritis [ ]  Change in skin lesion [ ]   Psych: Depression[ ]  Anxiety[ ]  Confusion [ ]  Memory loss [ ]   Heme/Lypmh: Bleeding [ ]  Bruising [ ]  Enlarged lymph nodes [ ]   Endocrine: Visual blurring [ ]  Paresthesia [ ]  Polyuria [ ]  Polydypsea [ ]    Heat/cold intolerance [ ]  Hypoglycemia [ ]   Family history- Review and unchanged Social history- Review and unchanged Physical Exam: BP 126/78  Pulse 88  Temp(Src) 98.2 F (36.8 C) (Temporal)  Resp 18  Ht 5\' 10"  (1.778 m)  Wt 246 lb (111.585 kg)  BMI 35.30 kg/m2 Wt Readings from Last 3 Encounters:  10/08/14 246 lb (111.585 kg)  07/02/14 252 lb (114.306 kg)  06/18/14 250 lb (113.399 kg)   General Appearance: Well nourished, in no apparent distress. Eyes: PERRLA, EOMs, conjunctiva no swelling or erythema Sinuses: No Frontal/maxillary tenderness ENT/Mouth: Ext aud canals clear, TMs without erythema, bulging. No erythema, swelling, or exudate on post pharynx.  Tonsils  not swollen or erythematous. Hearing normal.  Neck: Supple, thyroid normal.  Respiratory: Respiratory effort normal, BS equal bilaterally without rales, rhonchi, wheezing or stridor.  Cardio: RRR with no MRGs. Brisk peripheral pulses without edema.  Abdomen: Soft, + BS.  Non tender, no guarding, rebound, hernias, masses. Lymphatics: Non tender without lymphadenopathy.  Musculoskeletal: Full ROM, 5/5 strength, normal gait.  Skin: Warm, dry without rashes, lesions, ecchymosis.  Neuro: Cranial nerves intact. No cerebellar symptoms. Sensation intact.  Psych: Awake and oriented X 3, normal affect, Insight and Judgment appropriate.    Vicie Mutters, PA-C 10:31 AM [facility]

## 2014-10-08 NOTE — Patient Instructions (Signed)
Recommendations For Diabetic Patients:   -  Take medications as prescribed  -  Recommend Dr Joel Fuhrman's book "The End of Diabetes " - Can get at  www.Amazon.com and encourage also get the Audio CD book  - AVOID Animal products, ie. Meat - red/white, Poultry and Dairy/especially cheese - Exercise at least 5 times a week for 30 minutes or preferably daily.  - No Smoking - Drink less than 2 drinks a day.  - Monitor your feet for sores - Have yearly Eye Exams - Recommend annual Flu vaccine  - Recommend Pneumovax and Prevnar vaccines - Shingles Vaccine (Zostavax) if over 60 y.o.  Goals:   - BMI less than 24 - Fasting sugar less than 130 or less than 150 if tapering medicines to lose weight  - Systolic BP less than 130  - Diastolic BP less than 80 - Bad LDL Cholesterol less than 70 - Triglycerides less than 150  

## 2014-10-09 LAB — TESTOSTERONE: Testosterone: 448 ng/dL (ref 300–890)

## 2014-10-09 LAB — VITAMIN D 25 HYDROXY (VIT D DEFICIENCY, FRACTURES): Vit D, 25-Hydroxy: 97 ng/mL — ABNORMAL HIGH (ref 30–89)

## 2014-10-22 ENCOUNTER — Ambulatory Visit (INDEPENDENT_AMBULATORY_CARE_PROVIDER_SITE_OTHER): Payer: BC Managed Care – PPO | Admitting: *Deleted

## 2014-10-22 DIAGNOSIS — E291 Testicular hypofunction: Secondary | ICD-10-CM

## 2014-10-22 MED ORDER — TESTOSTERONE CYPIONATE 200 MG/ML IM SOLN
400.0000 mg | Freq: Once | INTRAMUSCULAR | Status: AC
  Administered 2014-10-22: 400 mg via INTRAMUSCULAR

## 2014-10-22 NOTE — Progress Notes (Signed)
Patient ID: Russell [name], male   DOB: 07/16/1961, 53 y.o.   MRN: [id number] Patient presents for testosterone injection for hypogonadism treatment.  Patient received 2 cc IM left glut.  Patient tolerated well.

## 2014-11-08 ENCOUNTER — Ambulatory Visit (INDEPENDENT_AMBULATORY_CARE_PROVIDER_SITE_OTHER): Payer: BC Managed Care – PPO | Admitting: *Deleted

## 2014-11-08 DIAGNOSIS — E291 Testicular hypofunction: Secondary | ICD-10-CM

## 2014-11-08 MED ORDER — TESTOSTERONE CYPIONATE 200 MG/ML IM SOLN
400.0000 mg | Freq: Once | INTRAMUSCULAR | Status: AC
Start: 2014-11-08 — End: 2014-11-08
  Administered 2014-11-08: 400 mg via INTRAMUSCULAR

## 2014-11-08 NOTE — Progress Notes (Signed)
Patient ID: Russell [name], male   DOB: 01/01/1961, 53 y.o.   MRN: [id number] Patient presents for testosterone injection.  Patient received 2 cc IM right glut.  Patient tolerated well.

## 2014-11-19 ENCOUNTER — Ambulatory Visit (INDEPENDENT_AMBULATORY_CARE_PROVIDER_SITE_OTHER): Payer: BC Managed Care – PPO | Admitting: *Deleted

## 2014-11-19 DIAGNOSIS — E291 Testicular hypofunction: Secondary | ICD-10-CM

## 2014-11-19 MED ORDER — TESTOSTERONE CYPIONATE 200 MG/ML IM SOLN
400.0000 mg | Freq: Once | INTRAMUSCULAR | Status: AC
  Administered 2014-11-19: 400 mg via INTRAMUSCULAR

## 2014-11-19 NOTE — Progress Notes (Signed)
Patient ID: Russell [name], male   DOB: [birth year], 53 y.o.   MRN: [id number] Patient presents for testosterone injection for hypogonadism Tx. Patient received 2 cc IM left glut.  Patient tolerated well.

## 2014-11-30 ENCOUNTER — Other Ambulatory Visit: Payer: Self-pay | Admitting: Emergency Medicine

## 2014-12-06 ENCOUNTER — Ambulatory Visit (INDEPENDENT_AMBULATORY_CARE_PROVIDER_SITE_OTHER): Payer: BC Managed Care – PPO | Admitting: *Deleted

## 2014-12-06 DIAGNOSIS — E291 Testicular hypofunction: Secondary | ICD-10-CM

## 2014-12-06 MED ORDER — TESTOSTERONE CYPIONATE 200 MG/ML IM SOLN
400.0000 mg | Freq: Once | INTRAMUSCULAR | Status: AC
Start: 2014-12-06 — End: 2014-12-06
  Administered 2014-12-06: 400 mg via INTRAMUSCULAR

## 2014-12-06 NOTE — Progress Notes (Signed)
Patient ID: Russell [name], male   DOB: 04/05/1961, 53 y.o.   MRN: [id number] Patient presents for testosterone injection for hypogonadism Tx.  Patient received 2 cc IM right glut. Patient tolerated well.

## 2014-12-17 ENCOUNTER — Ambulatory Visit (INDEPENDENT_AMBULATORY_CARE_PROVIDER_SITE_OTHER): Payer: BLUE CROSS/BLUE SHIELD

## 2014-12-17 DIAGNOSIS — E291 Testicular hypofunction: Secondary | ICD-10-CM

## 2014-12-17 MED ORDER — TESTOSTERONE CYPIONATE 200 MG/ML IM SOLN
400.0000 mg | Freq: Once | INTRAMUSCULAR | Status: AC
  Administered 2014-12-17: 400 mg via INTRAMUSCULAR

## 2014-12-17 NOTE — Progress Notes (Signed)
Patient ID: Russell [name], male   DOB: 07/29/1961, 54 y.o.   MRN: [id number] Patient presents for testosterone injection. Patient received 2 cc IM left glut. Patient tolerated well.

## 2014-12-20 ENCOUNTER — Ambulatory Visit: Payer: BC Managed Care – PPO | Admitting: Internal Medicine

## 2014-12-30 ENCOUNTER — Telehealth: Payer: Self-pay | Admitting: Internal Medicine

## 2014-12-30 NOTE — Telephone Encounter (Signed)
Left message for the patient to call and reschedule Friday 12-31-2014 appointment.  Thank you, Katrina Judeth Horn [facility] [phone] Fax [phone]

## 2014-12-31 ENCOUNTER — Ambulatory Visit: Payer: Self-pay

## 2015-01-06 ENCOUNTER — Ambulatory Visit: Payer: BLUE CROSS/BLUE SHIELD | Admitting: Internal Medicine

## 2015-01-06 NOTE — Progress Notes (Signed)
Patient ID: Russell [name], male   DOB: July 04, 1961, 54 y.o.   MRN: [id number]  [name]

## 2015-01-20 ENCOUNTER — Encounter: Payer: Self-pay | Admitting: Internal Medicine

## 2015-02-01 ENCOUNTER — Other Ambulatory Visit: Payer: Self-pay | Admitting: Internal Medicine

## 2015-02-24 ENCOUNTER — Encounter: Payer: Self-pay | Admitting: Internal Medicine

## 2015-02-24 ENCOUNTER — Ambulatory Visit (INDEPENDENT_AMBULATORY_CARE_PROVIDER_SITE_OTHER): Payer: BLUE CROSS/BLUE SHIELD | Admitting: Internal Medicine

## 2015-02-24 VITALS — BP 124/70 | HR 68 | Temp 98.4°F | Resp 18 | Ht 70.0 in | Wt 240.0 lb

## 2015-02-24 DIAGNOSIS — E785 Hyperlipidemia, unspecified: Secondary | ICD-10-CM

## 2015-02-24 DIAGNOSIS — I10 Essential (primary) hypertension: Secondary | ICD-10-CM

## 2015-02-24 DIAGNOSIS — E1365 Other specified diabetes mellitus with hyperglycemia: Secondary | ICD-10-CM

## 2015-02-24 DIAGNOSIS — IMO0002 Reserved for concepts with insufficient information to code with codable children: Secondary | ICD-10-CM

## 2015-02-24 DIAGNOSIS — E1322 Other specified diabetes mellitus with diabetic chronic kidney disease: Secondary | ICD-10-CM

## 2015-02-24 DIAGNOSIS — E291 Testicular hypofunction: Secondary | ICD-10-CM

## 2015-02-24 DIAGNOSIS — E559 Vitamin D deficiency, unspecified: Secondary | ICD-10-CM

## 2015-02-24 DIAGNOSIS — N182 Chronic kidney disease, stage 2 (mild): Secondary | ICD-10-CM

## 2015-02-24 DIAGNOSIS — E1329 Other specified diabetes mellitus with other diabetic kidney complication: Secondary | ICD-10-CM

## 2015-02-24 DIAGNOSIS — Z79899 Other long term (current) drug therapy: Secondary | ICD-10-CM

## 2015-02-24 LAB — CBC WITH DIFFERENTIAL/PLATELET
Basophils Absolute: 0 10*3/uL (ref 0.0–0.1)
Basophils Relative: 0 % (ref 0–1)
Eosinophils Absolute: 0.1 10*3/uL (ref 0.0–0.7)
Eosinophils Relative: 2 % (ref 0–5)
HCT: 52 % (ref 39.0–52.0)
Hemoglobin: 18 g/dL — ABNORMAL HIGH (ref 13.0–17.0)
Lymphocytes Relative: 53 % — ABNORMAL HIGH (ref 12–46)
Lymphs Abs: 3 10*3/uL (ref 0.7–4.0)
MCH: 28.2 pg (ref 26.0–34.0)
MCHC: 34.6 g/dL (ref 30.0–36.0)
MCV: 81.4 fL (ref 78.0–100.0)
MPV: 9.1 fL (ref 8.6–12.4)
Monocytes Absolute: 0.6 10*3/uL (ref 0.1–1.0)
Monocytes Relative: 11 % (ref 3–12)
Neutro Abs: 1.9 10*3/uL (ref 1.7–7.7)
Neutrophils Relative %: 34 % — ABNORMAL LOW (ref 43–77)
Platelets: 239 10*3/uL (ref 150–400)
RBC: 6.39 MIL/uL — ABNORMAL HIGH (ref 4.22–5.81)
RDW: 14.9 % (ref 11.5–15.5)
WBC: 5.7 10*3/uL (ref 4.0–10.5)

## 2015-02-24 MED ORDER — EZETIMIBE 10 MG PO TABS
10.0000 mg | ORAL_TABLET | Freq: Every day | ORAL | Status: DC
Start: 2015-02-24 — End: 2015-11-10

## 2015-02-24 MED ORDER — VITAMIN D (ERGOCALCIFEROL) 1.25 MG (50000 UNIT) PO CAPS: ORAL_CAPSULE | ORAL | Status: DC

## 2015-02-24 MED ORDER — EZETIMIBE 10 MG PO TABS: 10.0000 mg | ORAL_TABLET | Freq: Every day | ORAL | Status: DC

## 2015-02-24 MED ORDER — TESTOSTERONE CYPIONATE 200 MG/ML IM SOLN
400.0000 mg | Freq: Once | INTRAMUSCULAR | Status: AC
  Administered 2015-02-24: 400 mg via INTRAMUSCULAR

## 2015-02-24 NOTE — Patient Instructions (Signed)

## 2015-02-24 NOTE — Progress Notes (Signed)
Patient ID: Russell [name], male   DOB: 27-Jan-1961, 54 y.o.   MRN: [id number]  Assessment and Plan:  Hypertension:  -Continue medication -monitor blood pressure at home. -Continue DASH diet -Reminder to go to the ER if any CP, SOB, nausea, dizziness, severe HA, changes vision/speech, left arm numbness and tingling and jaw pain.  Cholesterol - Continue diet and exercise -Check cholesterol.  - Pt reporting muscle aches.  Will cut back to 20 mg 3 times per week -refill for zetia given  Diabetes without complications -Continue diet and exercise.  -Check A1C -cont meds  Vitamin D Def -check level -continue medications. -refill given   Continue diet and meds as discussed. Further disposition pending results of labs. Discussed med's effects and SE's.    HPI 54 y.o. male  presents for 3 month follow up with hypertension, hyperlipidemia, diabetes and [name].   His blood pressure has been controlled at home, today their BP is BP: 124/70 mmHg.He does workout. He denies chest pain, shortness of breath, dizziness.   He is on cholesterol medication and denies myalgias. His cholesterol is not at goal. The cholesterol was:  10/08/2014: Cholesterol, Total 226*; HDL-C 34*; LDL (calc) NOT CALC; Triglycerides 555*.  Patient does have some myalgias with the medication.  He is taking 20 mg daily.     He has been working on diet and exercise for diabetes without complications, he is on bASA, he is on ACE/ARB, and denies  foot ulcerations, hyperglycemia, hypoglycemia , increased appetite, nausea, paresthesia of the feet, polydipsia, polyuria, visual disturbances, vomiting and weight loss. Last A1C was: 10/08/2014: Hemoglobin-A1c 10.0*   Patient is on Vitamin D supplement. 10/08/2014: VITD 97*    Current Medications:  Current Outpatient Prescriptions on File Prior to Visit  Medication Sig Dispense Refill  . aspirin 81 MG tablet Take 81 mg by mouth daily.     Marland Kitchen atorvastatin (LIPITOR)  40 MG tablet Take 20 mg by mouth daily.    . bisoprolol-hydrochlorothiazide (ZIAC) 10-6.25 MG per tablet TAKE 1 TABLET BY MOUTH EVERY DAY 90 tablet 1  . canagliflozin (INVOKANA) 300 MG TABS tablet Take 300 mg by mouth daily before breakfast.    . CIALIS 20 MG tablet TAKE 1/2 TO 1 TABLET EVERY 2 TO 3 DAYS AS NEEDED 10 tablet 0  . ciclesonide (OMNARIS) 50 MCG/ACT nasal spray Place 2 sprays into both nostrils daily.    . clotrimazole-betamethasone (LOTRISONE) cream Apply 3 times a day as needed 15 g 11  . fluconazole (DIFLUCAN) 150 MG tablet Take 1 tab by mouth once a week 4 tablet 11  . hydrochlorothiazide (HYDRODIURIL) 25 MG tablet Take 1/2 to 1 tablet daily as directed for BP & Fluid 90 tablet 99  . insulin detemir (LEVEMIR) 100 UNIT/ML injection Inject 15 Units into the skin at bedtime.    . Insulin Pen Needle 31G X 5 MM MISC 1 qd 100 each 6  . INVOKANA 300 MG TABS tablet TAKE 1 TABLET BY MOUTH EVERY DAY 30 tablet 3  . LEVEMIR FLEXTOUCH 100 UNIT/ML Pen START WITH 10 UNITS AND TITRATE AS DIRECTED 15 mL 6  . losartan (COZAAR) 100 MG tablet TAKE 1 TABLET (100 MG TOTAL) BY MOUTH DAILY. 90 tablet 99  . metFORMIN (GLUCOPHAGE) 500 MG tablet Take 1,000 mg by mouth 2 (two) times daily with a meal.    . testosterone cypionate (DEPOTESTOTERONE CYPIONATE) 200 MG/ML injection Inject 2 mLs (400 mg total) into the muscle every 14 (fourteen) days. 10  mL 1   No current facility-administered medications on file prior to visit.   Medical History:  Past Medical History  Diagnosis Date  . Hypertension   . Hyperlipidemia   . Diabetes mellitus   . Hypogonadism male   . Chronic sinusitis   . Allergy   . Allergic rhinitis    Allergies:  Allergies  Allergen Reactions  . Ppd [Tuberculin Purified Protein Derivative] Other (See Comments)    POSITIVE REACTION IN PAST 11/21/11 CXR NEG     Review of Systems:  Review of Systems  Constitutional: Negative for fever and chills.  HENT: Positive for congestion.  Negative for ear pain, nosebleeds, sore throat and tinnitus.   Eyes: Negative.   Respiratory: Negative for cough, sputum production, shortness of breath and wheezing.   Cardiovascular: Negative for chest pain, palpitations and leg swelling.  Gastrointestinal: Negative for heartburn, nausea, vomiting, diarrhea, constipation, blood in stool and melena.  Genitourinary: Negative.   Musculoskeletal: Negative.   Skin: Negative.   Neurological: Negative.  Negative for headaches.  Psychiatric/Behavioral: Negative.     Family history- Review and unchanged  Social history- Review and unchanged  Physical Exam: BP 124/70 mmHg  Pulse 68  Temp(Src) 98.4 F (36.9 C) (Temporal)  Resp 18  Ht 5\' 10"  (1.778 m)  Wt 240 lb (108.863 kg)  BMI 34.44 kg/m2 Wt Readings from Last 3 Encounters:  02/24/15 240 lb (108.863 kg)  10/08/14 246 lb (111.585 kg)  07/02/14 252 lb (114.306 kg)   General Appearance: Well nourished well developed, non-toxic appearing, in no apparent distress. Eyes: PERRLA, EOMs, conjunctiva no swelling or erythema ENT/Mouth: Ear canals clear with no erythema, swelling, or discharge.  TMs normal bilaterally, oropharynx clear, moist, with no exudate.   Neck: Supple, thyroid normal, no JVD, no cervical adenopathy.  Respiratory: Respiratory effort normal, breath sounds clear A&P, no wheeze, rhonchi or rales noted.  No retractions, no accessory muscle usage Cardio: RRR with no MRGs. No noted edema.  Abdomen: Soft, + BS.  Non tender, no guarding, rebound, hernias, masses. Musculoskeletal: Full ROM, 5/5 strength, Normal gait Skin: Warm, dry without rashes, lesions, ecchymosis.  Neuro: Awake and oriented X 3, Cranial nerves intact. No cerebellar symptoms.  Psych: normal affect, Insight and Judgment appropriate.    FORCUCCI, Johnthomas Lader, PA-C 4:02 PM [facility]

## 2015-02-25 ENCOUNTER — Other Ambulatory Visit: Payer: Self-pay | Admitting: Internal Medicine

## 2015-02-25 LAB — TSH: TSH: 0.743 u[IU]/mL (ref 0.350–4.500)

## 2015-02-25 LAB — HEPATIC FUNCTION PANEL
ALT: 26 U/L (ref 0–53)
AST: 20 U/L (ref 0–37)
Albumin: 4.5 g/dL (ref 3.5–5.2)
Alkaline Phosphatase: 55 U/L (ref 39–117)
Bilirubin, Direct: 0.2 mg/dL (ref 0.0–0.3)
Indirect Bilirubin: 0.8 mg/dL (ref 0.2–1.2)
Total Bilirubin: 1 mg/dL (ref 0.2–1.2)
Total Protein: 7.4 g/dL (ref 6.0–8.3)

## 2015-02-25 LAB — BASIC METABOLIC PANEL WITH GFR
BUN: 18 mg/dL (ref 6–23)
CO2: 29 mEq/L (ref 19–32)
Calcium: 10.3 mg/dL (ref 8.4–10.5)
Chloride: 97 mEq/L (ref 96–112)
Creat: 0.97 mg/dL (ref 0.50–1.35)
GFR, Est African American: >89 mL/min
GFR, Est Non African American: 89 mL/min
Glucose, Bld: 118 mg/dL — ABNORMAL HIGH (ref 70–99)
Potassium: 3.9 mEq/L (ref 3.5–5.3)
Sodium: 137 mEq/L (ref 135–145)

## 2015-02-25 LAB — HEMOGLOBIN A1C
Hgb A1c MFr Bld: 9.9 % — ABNORMAL HIGH (ref <5.7)
Mean Plasma Glucose: 237 mg/dL — ABNORMAL HIGH (ref <117)

## 2015-02-25 LAB — VITAMIN D 25 HYDROXY (VIT D DEFICIENCY, FRACTURES): Vit D, 25-Hydroxy: 59 ng/mL (ref 30–100)

## 2015-02-25 LAB — LIPID PANEL
Cholesterol: 175 mg/dL (ref 0–200)
HDL: 37 mg/dL — ABNORMAL LOW (ref >=40)
LDL Cholesterol: 111 mg/dL — ABNORMAL HIGH (ref 0–99)
Total CHOL/HDL Ratio: 4.7 Ratio
Triglycerides: 137 mg/dL (ref <150)
VLDL: 27 mg/dL (ref 0–40)

## 2015-02-25 LAB — INSULIN, FASTING: Insulin fasting, serum: 42.5 u[IU]/mL — ABNORMAL HIGH (ref 2.0–19.6)

## 2015-02-25 LAB — MAGNESIUM: Magnesium: 2.1 mg/dL (ref 1.5–2.5)

## 2015-02-28 ENCOUNTER — Other Ambulatory Visit: Payer: Self-pay | Admitting: Internal Medicine

## 2015-02-28 ENCOUNTER — Telehealth: Payer: Self-pay | Admitting: Internal Medicine

## 2015-02-28 MED ORDER — PHENTERMINE HCL 37.5 MG PO TABS: 37.5000 mg | ORAL_TABLET | Freq: Every day | ORAL | Status: DC

## 2015-02-28 NOTE — Telephone Encounter (Signed)
Patient started back on phentermine.  Will see back in 1 month for BP recheck.

## 2015-03-03 ENCOUNTER — Other Ambulatory Visit: Payer: Self-pay | Admitting: *Deleted

## 2015-03-03 MED ORDER — EZETIMIBE 10 MG PO TABS: 10.0000 mg | ORAL_TABLET | Freq: Every day | ORAL | Status: DC

## 2015-03-08 ENCOUNTER — Other Ambulatory Visit: Payer: Self-pay

## 2015-03-08 MED ORDER — ATORVASTATIN CALCIUM 40 MG PO TABS: 20.0000 mg | ORAL_TABLET | Freq: Every day | ORAL | Status: DC

## 2015-03-11 ENCOUNTER — Ambulatory Visit (INDEPENDENT_AMBULATORY_CARE_PROVIDER_SITE_OTHER): Payer: BLUE CROSS/BLUE SHIELD

## 2015-03-11 DIAGNOSIS — E291 Testicular hypofunction: Secondary | ICD-10-CM

## 2015-03-11 MED ORDER — TESTOSTERONE CYPIONATE 200 MG/ML IM SOLN
400.0000 mg | Freq: Once | INTRAMUSCULAR | Status: AC
  Administered 2015-03-11: 400 mg via INTRAMUSCULAR

## 2015-03-11 NOTE — Progress Notes (Signed)
Patient ID: Russell [name], male   DOB: [birth year], 54 y.o.   MRN: [id number] Patient here today for testosterone injection. Patient received 2.0 mL IM left glut. Patient tolerated well.

## 2015-03-25 ENCOUNTER — Ambulatory Visit (INDEPENDENT_AMBULATORY_CARE_PROVIDER_SITE_OTHER): Payer: BLUE CROSS/BLUE SHIELD | Admitting: *Deleted

## 2015-03-25 DIAGNOSIS — N6320 Unspecified lump in the left breast, unspecified quadrant: Secondary | ICD-10-CM

## 2015-03-25 DIAGNOSIS — N6325 Unspecified lump in the left breast, overlapping quadrants: Secondary | ICD-10-CM

## 2015-03-25 DIAGNOSIS — N63 Unspecified lump in breast: Secondary | ICD-10-CM

## 2015-03-25 DIAGNOSIS — E291 Testicular hypofunction: Secondary | ICD-10-CM

## 2015-03-25 MED ORDER — TESTOSTERONE CYPIONATE 200 MG/ML IM SOLN
400.0000 mg | Freq: Once | INTRAMUSCULAR | Status: AC
Start: 2015-03-25 — End: 2015-03-25
  Administered 2015-03-25: 400 mg via INTRAMUSCULAR

## 2015-03-25 NOTE — Progress Notes (Signed)
Patient ID: Russell [name], male   DOB: 10-10-1961, 54 y.o.   MRN: [id number] Patient presents for testosterone injection for hypogonadism TX.  Patient received 2 cc IM right glute and tolerated well.  Patient also notes tender, palpable mass 12' left breast times 2 months.  Per Dr. Melford Aase we will order a breast u/s for further evaluation.

## 2015-03-30 ENCOUNTER — Other Ambulatory Visit: Payer: Self-pay | Admitting: *Deleted

## 2015-03-30 DIAGNOSIS — N6325 Unspecified lump in the left breast, overlapping quadrants: Secondary | ICD-10-CM

## 2015-03-30 DIAGNOSIS — N644 Mastodynia: Secondary | ICD-10-CM

## 2015-03-30 DIAGNOSIS — N6320 Unspecified lump in the left breast, unspecified quadrant: Secondary | ICD-10-CM

## 2015-04-04 ENCOUNTER — Ambulatory Visit
Admission: RE | Admit: 2015-04-04 | Discharge: 2015-04-04 | Disposition: A | Discharge status: Home / Self Care | Payer: BLUE CROSS/BLUE SHIELD | Source: Ambulatory Visit | Attending: Internal Medicine | Admitting: Internal Medicine

## 2015-04-04 DIAGNOSIS — N644 Mastodynia: Secondary | ICD-10-CM

## 2015-04-04 DIAGNOSIS — N6320 Unspecified lump in the left breast, unspecified quadrant: Secondary | ICD-10-CM

## 2015-04-04 DIAGNOSIS — N6325 Unspecified lump in the left breast, overlapping quadrants: Secondary | ICD-10-CM

## 2015-04-07 ENCOUNTER — Other Ambulatory Visit: Payer: Self-pay | Admitting: *Deleted

## 2015-04-07 ENCOUNTER — Other Ambulatory Visit: Payer: Self-pay | Admitting: Internal Medicine

## 2015-04-08 ENCOUNTER — Ambulatory Visit (INDEPENDENT_AMBULATORY_CARE_PROVIDER_SITE_OTHER): Payer: BLUE CROSS/BLUE SHIELD | Admitting: *Deleted

## 2015-04-08 VITALS — BP 138/80 | HR 78 | Resp 18 | Ht 70.0 in | Wt 244.0 lb

## 2015-04-08 DIAGNOSIS — E291 Testicular hypofunction: Secondary | ICD-10-CM

## 2015-04-08 MED ORDER — PHENTERMINE HCL 37.5 MG PO TABS: 37.5000 mg | ORAL_TABLET | Freq: Every day | ORAL | Status: DC

## 2015-04-08 MED ORDER — TESTOSTERONE CYPIONATE 200 MG/ML IM SOLN
400.0000 mg | Freq: Once | INTRAMUSCULAR | Status: AC
  Administered 2015-04-08: 400 mg via INTRAMUSCULAR

## 2015-04-08 MED ORDER — "INSULIN SYRINGE-NEEDLE U-100 31G X 15/64"" 0.3 ML MISC": Status: DC

## 2015-04-08 MED ORDER — TESTOSTERONE CYPIONATE 200 MG/ML IM SOLN: 400.0000 mg | INTRAMUSCULAR | Status: DC

## 2015-04-08 NOTE — Progress Notes (Signed)
Patient ID: Russell [name], male   DOB: 03/09/1961, 54 y.o.   MRN: [id number] Patient presents for testosterone injection for hypogonadism Tx.  Patient received 2 cc IM left glute and tolerated well.

## 2015-04-20 ENCOUNTER — Other Ambulatory Visit: Payer: Self-pay | Admitting: Internal Medicine

## 2015-04-22 ENCOUNTER — Ambulatory Visit (INDEPENDENT_AMBULATORY_CARE_PROVIDER_SITE_OTHER): Payer: BLUE CROSS/BLUE SHIELD

## 2015-04-22 ENCOUNTER — Ambulatory Visit: Payer: Self-pay

## 2015-04-22 DIAGNOSIS — E291 Testicular hypofunction: Secondary | ICD-10-CM

## 2015-04-22 MED ORDER — TESTOSTERONE CYPIONATE 200 MG/ML IM SOLN
400.0000 mg | Freq: Once | INTRAMUSCULAR | Status: AC
Start: 2015-04-22 — End: 2015-04-22
  Administered 2015-04-22: 400 mg via INTRAMUSCULAR

## 2015-04-22 NOTE — Progress Notes (Signed)
Patient ID: Russell [name], male   DOB: Sep 07, 1961, 54 y.o.   MRN: [id number] Patient here today for testosterone injection. Patient received 2.0 mL IM right glut. Patient tolerated well.

## 2015-04-26 ENCOUNTER — Other Ambulatory Visit: Payer: Self-pay | Admitting: Internal Medicine

## 2015-05-05 ENCOUNTER — Ambulatory Visit (INDEPENDENT_AMBULATORY_CARE_PROVIDER_SITE_OTHER): Payer: BLUE CROSS/BLUE SHIELD | Admitting: *Deleted

## 2015-05-05 DIAGNOSIS — E291 Testicular hypofunction: Secondary | ICD-10-CM

## 2015-05-05 MED ORDER — TESTOSTERONE CYPIONATE 200 MG/ML IM SOLN
400.0000 mg | Freq: Once | INTRAMUSCULAR | Status: AC
  Administered 2015-05-05: 400 mg via INTRAMUSCULAR

## 2015-05-05 NOTE — Progress Notes (Signed)
Patient ID: Russell [name], male   DOB: 12-31-1960, 54 y.o.   MRN: [id number] Patient presents for testosterone injection for hypogonadism Tx.  Patient received 2 cc IM left glute, tolerated well.

## 2015-05-20 ENCOUNTER — Ambulatory Visit (INDEPENDENT_AMBULATORY_CARE_PROVIDER_SITE_OTHER): Payer: BLUE CROSS/BLUE SHIELD | Admitting: *Deleted

## 2015-05-20 DIAGNOSIS — E291 Testicular hypofunction: Secondary | ICD-10-CM

## 2015-05-20 MED ORDER — TESTOSTERONE CYPIONATE 200 MG/ML IM SOLN
400.0000 mg | Freq: Once | INTRAMUSCULAR | Status: AC
  Administered 2015-05-20: 400 mg via INTRAMUSCULAR

## 2015-05-20 NOTE — Progress Notes (Signed)
Patient ID: Russell [name], male   DOB: 1961/07/05, 54 y.o.   MRN: [id number]  Patient presents for testosterone injection for hypogonadism Tx.  Patient received 2 cc IM right glute and tolerated well.

## 2015-06-03 ENCOUNTER — Ambulatory Visit: Payer: Self-pay

## 2015-06-03 ENCOUNTER — Ambulatory Visit (INDEPENDENT_AMBULATORY_CARE_PROVIDER_SITE_OTHER): Payer: BLUE CROSS/BLUE SHIELD

## 2015-06-03 ENCOUNTER — Other Ambulatory Visit: Payer: Self-pay

## 2015-06-03 DIAGNOSIS — E291 Testicular hypofunction: Secondary | ICD-10-CM

## 2015-06-03 MED ORDER — TESTOSTERONE CYPIONATE 200 MG/ML IM SOLN
400.0000 mg | Freq: Once | INTRAMUSCULAR | Status: AC
  Administered 2015-06-03: 400 mg via INTRAMUSCULAR

## 2015-06-03 MED ORDER — TADALAFIL 20 MG PO TABS: ORAL_TABLET | ORAL | Status: DC

## 2015-06-03 NOTE — Progress Notes (Signed)
Patient ID: Russell [name], male   DOB: 30-Dec-1960, 54 y.o.   MRN: [id number] Patient here today for testosterone injection. Patient received 2.0 mL IM left glut. Patient tolerated well.

## 2015-06-03 NOTE — Addendum Note (Signed)
Addended by: Waylan Rocher A on: 06/03/2015 10:41 AM   Modules accepted: Level of Service

## 2015-06-10 ENCOUNTER — Encounter: Payer: Self-pay | Admitting: Internal Medicine

## 2015-06-17 ENCOUNTER — Ambulatory Visit (INDEPENDENT_AMBULATORY_CARE_PROVIDER_SITE_OTHER): Payer: BLUE CROSS/BLUE SHIELD | Admitting: *Deleted

## 2015-06-17 DIAGNOSIS — E291 Testicular hypofunction: Secondary | ICD-10-CM

## 2015-06-17 MED ORDER — TESTOSTERONE CYPIONATE 200 MG/ML IM SOLN
400.0000 mg | Freq: Once | INTRAMUSCULAR | Status: AC
  Administered 2015-06-17: 400 mg via INTRAMUSCULAR

## 2015-06-17 NOTE — Progress Notes (Signed)
Patient ID: Russell [name], male   DOB: 1961/09/20, 54 y.o.   MRN: [id number] Patient presents for testosterone injection for hypogonadism Tx.  Patient received 2 cc IM right glute and tolerated well.

## 2015-07-01 ENCOUNTER — Ambulatory Visit: Payer: Self-pay

## 2015-07-01 ENCOUNTER — Other Ambulatory Visit: Payer: Self-pay | Admitting: Internal Medicine

## 2015-07-15 ENCOUNTER — Inpatient Hospital Stay (HOSPITAL_COMMUNITY)
Admission: EM | Admit: 2015-07-15 | Discharge: 2015-07-18 | DRG: 684 | Disposition: A | Discharge status: Home / Self Care | Payer: BLUE CROSS/BLUE SHIELD | Attending: Internal Medicine | Admitting: Internal Medicine

## 2015-07-15 ENCOUNTER — Ambulatory Visit (INDEPENDENT_AMBULATORY_CARE_PROVIDER_SITE_OTHER): Payer: BLUE CROSS/BLUE SHIELD

## 2015-07-15 ENCOUNTER — Encounter (HOSPITAL_COMMUNITY): Payer: Self-pay | Admitting: Emergency Medicine

## 2015-07-15 DIAGNOSIS — Z8249 Family history of ischemic heart disease and other diseases of the circulatory system: Secondary | ICD-10-CM

## 2015-07-15 DIAGNOSIS — I10 Essential (primary) hypertension: Secondary | ICD-10-CM | POA: Diagnosis present

## 2015-07-15 DIAGNOSIS — N179 Acute kidney failure, unspecified: Principal | ICD-10-CM | POA: Diagnosis present

## 2015-07-15 DIAGNOSIS — S37019A Minor contusion of unspecified kidney, initial encounter: Secondary | ICD-10-CM | POA: Diagnosis present

## 2015-07-15 DIAGNOSIS — J309 Allergic rhinitis, unspecified: Secondary | ICD-10-CM | POA: Diagnosis present

## 2015-07-15 DIAGNOSIS — R109 Unspecified abdominal pain: Secondary | ICD-10-CM

## 2015-07-15 DIAGNOSIS — Z794 Long term (current) use of insulin: Secondary | ICD-10-CM

## 2015-07-15 DIAGNOSIS — R739 Hyperglycemia, unspecified: Secondary | ICD-10-CM

## 2015-07-15 DIAGNOSIS — E291 Testicular hypofunction: Secondary | ICD-10-CM

## 2015-07-15 DIAGNOSIS — E119 Type 2 diabetes mellitus without complications: Secondary | ICD-10-CM

## 2015-07-15 DIAGNOSIS — N2889 Other specified disorders of kidney and ureter: Secondary | ICD-10-CM | POA: Diagnosis present

## 2015-07-15 DIAGNOSIS — J329 Chronic sinusitis, unspecified: Secondary | ICD-10-CM | POA: Diagnosis present

## 2015-07-15 DIAGNOSIS — D649 Anemia, unspecified: Secondary | ICD-10-CM | POA: Diagnosis present

## 2015-07-15 DIAGNOSIS — R7611 Nonspecific reaction to tuberculin skin test without active tuberculosis: Secondary | ICD-10-CM | POA: Diagnosis present

## 2015-07-15 DIAGNOSIS — E1169 Type 2 diabetes mellitus with other specified complication: Secondary | ICD-10-CM | POA: Diagnosis present

## 2015-07-15 DIAGNOSIS — Z7982 Long term (current) use of aspirin: Secondary | ICD-10-CM

## 2015-07-15 DIAGNOSIS — Z79899 Other long term (current) drug therapy: Secondary | ICD-10-CM

## 2015-07-15 DIAGNOSIS — D751 Secondary polycythemia: Secondary | ICD-10-CM | POA: Diagnosis present

## 2015-07-15 DIAGNOSIS — Z833 Family history of diabetes mellitus: Secondary | ICD-10-CM

## 2015-07-15 DIAGNOSIS — Z87891 Personal history of nicotine dependence: Secondary | ICD-10-CM

## 2015-07-15 DIAGNOSIS — E785 Hyperlipidemia, unspecified: Secondary | ICD-10-CM | POA: Diagnosis present

## 2015-07-15 DIAGNOSIS — E1165 Type 2 diabetes mellitus with hyperglycemia: Secondary | ICD-10-CM | POA: Diagnosis present

## 2015-07-15 LAB — BASIC METABOLIC PANEL
Anion gap: 12 (ref 5–15)
BUN: 29 mg/dL — ABNORMAL HIGH (ref 6–20)
CO2: 25 mmol/L (ref 22–32)
Calcium: 10.1 mg/dL (ref 8.9–10.3)
Chloride: 98 mmol/L — ABNORMAL LOW (ref 101–111)
Creatinine, Ser: 2.26 mg/dL — ABNORMAL HIGH (ref 0.61–1.24)
GFR calc Af Amer: 37 mL/min — ABNORMAL LOW (ref >60)
GFR calc non Af Amer: 32 mL/min — ABNORMAL LOW (ref >60)
Glucose, Bld: 336 mg/dL — ABNORMAL HIGH (ref 65–99)
Potassium: 4.1 mmol/L (ref 3.5–5.1)
Sodium: 135 mmol/L (ref 135–145)

## 2015-07-15 LAB — CBC WITH DIFFERENTIAL/PLATELET
Basophils Absolute: 0 10*3/uL (ref 0.0–0.1)
Basophils Relative: 0 % (ref 0–1)
Eosinophils Absolute: 0.1 10*3/uL (ref 0.0–0.7)
Eosinophils Relative: 1 % (ref 0–5)
HCT: 57.8 % — ABNORMAL HIGH (ref 39.0–52.0)
Hemoglobin: 19.6 g/dL — ABNORMAL HIGH (ref 13.0–17.0)
Lymphocytes Relative: 23 % (ref 12–46)
Lymphs Abs: 2.5 10*3/uL (ref 0.7–4.0)
MCH: 29 pg (ref 26.0–34.0)
MCHC: 33.9 g/dL (ref 30.0–36.0)
MCV: 85.5 fL (ref 78.0–100.0)
Monocytes Absolute: 1 10*3/uL (ref 0.1–1.0)
Monocytes Relative: 9 % (ref 3–12)
Neutro Abs: 7.1 10*3/uL (ref 1.7–7.7)
Neutrophils Relative %: 67 % (ref 43–77)
Platelets: 267 10*3/uL (ref 150–400)
RBC: 6.76 MIL/uL — ABNORMAL HIGH (ref 4.22–5.81)
RDW: 13.6 % (ref 11.5–15.5)
WBC: 10.7 10*3/uL — ABNORMAL HIGH (ref 4.0–10.5)

## 2015-07-15 MED ORDER — KETOROLAC TROMETHAMINE 60 MG/2ML IM SOLN
60.0000 mg | Freq: Once | INTRAMUSCULAR | Status: DC
  Filled 2015-07-15: qty 2

## 2015-07-15 MED ORDER — SODIUM CHLORIDE 0.9 % IV BOLUS (SEPSIS): 1000.0000 mL | Freq: Once | INTRAVENOUS | Status: DC

## 2015-07-15 MED ORDER — TESTOSTERONE CYPIONATE 200 MG/ML IM SOLN
400.0000 mg | INTRAMUSCULAR | Status: DC
  Administered 2015-07-15: 400 mg via INTRAMUSCULAR

## 2015-07-15 MED ORDER — KETOROLAC TROMETHAMINE 30 MG/ML IJ SOLN
30.0000 mg | Freq: Once | INTRAMUSCULAR | Status: AC
  Administered 2015-07-15: 30 mg via INTRAVENOUS
  Filled 2015-07-15: qty 1

## 2015-07-15 MED ORDER — ONDANSETRON HCL 4 MG/2ML IJ SOLN
4.0000 mg | Freq: Once | INTRAMUSCULAR | Status: AC
  Administered 2015-07-15: 4 mg via INTRAVENOUS
  Filled 2015-07-15: qty 2

## 2015-07-15 NOTE — ED Notes (Signed)
C/o lower back spasms since this afternoon.   History of same.  Took Motrin 600 mg without relief.  No known injury.

## 2015-07-15 NOTE — Progress Notes (Signed)
Patient ID: Russell [name], male   DOB: 06/14/1961, 54 y.o.   MRN: [id number] Patient presents for testosterone injection for hypogonadism. Patient received 2 cc IM left glute and tolerated well.

## 2015-07-15 NOTE — ED Notes (Addendum)
Pt reports that the lower back pain started about 30 minutes after he ate dinner.  Is unable to sit still, never had this pain before.  Nausea started around the same time. Reports periods of "hot flashes and sweating."

## 2015-07-15 NOTE — ED Provider Notes (Signed)
CSN: [id number]     Arrival date & time 07/15/15  2215 History  This chart was scribed for non-physician practitioner, Ottie Glazier, PA-C working with Sharlett Iles, MD by Tula Nakayama, ED scribe. This patient was seen in room TR10C/TR10C and the patient's care was started at 10:50 PM   Chief Complaint  Patient presents with  . Back Pain   The history is provided by the patient. No language interpreter was used.   HPI Comments: Russell [name] is a 54 y.o. male with a history of diabetes, hypertension, hyperlipidemia who presents to the Emergency Department complaining of sudden onset, moderate, piercing, right flank pain that started a few hours ago. Pt states nausea as an associated symptom. He notes that pain became worse after drinking water, but improves with movement. Pt reports onset of symptoms started soon after he ate dinner. Pt was evaluated for kidney function a few years ago after he had hematuria. He denies a history of similar pain and a history of kidney stones. Pt denies bladder or bowel incontinence, dysuria, abdominal pain, vomiting, fever, chills and hematuria as associated symptoms.  Past Medical History  Diagnosis Date  . Hypertension   . Hyperlipidemia   . Diabetes mellitus   . Hypogonadism male   . Chronic sinusitis   . Allergy   . Allergic rhinitis    Past Surgical History  Procedure Laterality Date  . Wisdom tooth extraction    . Hernia repair     Family History  Problem Relation Age of Onset  . Diabetes Mother   . Hypertension Mother   . Hyperlipidemia Mother   . Diabetes Father   . Hypertension Father   . Hyperlipidemia Father   . Diabetes Brother   . Hypertension Brother    History  Substance Use Topics  . Smoking status: Former Smoker -- 0.30 packs/day for 8 years    Types: Cigarettes    Quit date: 12/10/2000  . Smokeless tobacco: Not on file  . Alcohol Use: Yes     Comment: Special Occassions    Review of Systems   Constitutional: Negative for fever and chills.  Gastrointestinal: Positive for nausea. Negative for vomiting and abdominal pain.  Genitourinary: Positive for frequency. Negative for dysuria and hematuria.  Musculoskeletal: Positive for back pain.  All other systems reviewed and are negative.     Allergies  Ppd  Home Medications   Prior to Admission medications   Medication Sig Start Date End Date Taking? Authorizing Provider  aspirin 81 MG tablet Take 81 mg by mouth daily.    Yes Historical Provider, MD  atorvastatin (LIPITOR) 40 MG tablet Take 0.5 tablets (20 mg total) by mouth daily. 03/08/15  Yes Unk Pinto, MD  bisoprolol-hydrochlorothiazide [facility]) 10-6.25 MG per tablet TAKE 1 TABLET BY MOUTH EVERY DAY 04/26/15  Yes Unk Pinto, MD  ciclesonide (OMNARIS) 50 MCG/ACT nasal spray Place 2 sprays into both nostrils daily as needed for allergies.    Yes Historical Provider, MD  clotrimazole-betamethasone (LOTRISONE) cream Apply 3 times a day as needed Patient taking differently: Apply 1 application topically 3 (three) times daily as needed (rash). Apply 3 times a day as needed 09/24/14  Yes Unk Pinto, MD  ezetimibe (ZETIA) 10 MG tablet Take 1 tablet (10 mg total) by mouth daily. for cholesterol 03/03/15  Yes Unk Pinto, MD  fluconazole (DIFLUCAN) 150 MG tablet Take 1 tab by mouth once a week Patient taking differently: Take 150 mg by mouth See admin instructions. Take  1 tab by mouth once a week as needed for yeast infections 09/24/14  Yes Unk Pinto, MD  hydrochlorothiazide (HYDRODIURIL) 25 MG tablet TAKE 1/2 TO 1 TABLET DAILY AS DIRECTED FOR BP & FLUID 07/01/15  Yes Unk Pinto, MD  Insulin Pen Needle 31G X 5 MM MISC 1 qd 11/03/13  Yes Melissa Smith, PA-C  Insulin Syringe-Needle U-100 31G X 15/64" 0.3 ML MISC Use as directed 04/08/15  Yes Courtney Forcucci, PA-C  INVOKANA 300 MG TABS tablet TAKE 1 TABLET BY MOUTH EVERY DAY 04/07/15  Yes Unk Pinto, MD   LEVEMIR FLEXTOUCH 100 UNIT/ML Pen START WITH 10 UNITS AND TITRATE AS DIRECTED Patient taking differently: 15 units every morning 07/03/14  Yes Unk Pinto, MD  losartan (COZAAR) 100 MG tablet TAKE 1 TABLET (100 MG TOTAL) BY MOUTH DAILY. 08/02/14  Yes Unk Pinto, MD  metFORMIN (GLUCOPHAGE) 500 MG tablet Take 1,000 mg by mouth 2 (two) times daily with a meal.   Yes Historical Provider, MD  tadalafil (CIALIS) 20 MG tablet Take 1/2 to 1 tablet every 2-3 days as needed Patient taking differently: Take 10-20 mg by mouth daily as needed for erectile dysfunction. Take 1/2 to 1 tablet every 2-3 days as needed 06/03/15  Yes Vicie Mutters, PA-C  testosterone cypionate (DEPOTESTOTERONE CYPIONATE) 200 MG/ML injection Inject 2 mLs (400 mg total) into the muscle every 14 (fourteen) days. 04/08/15  Yes Courtney Forcucci, PA-C  Vitamin D, Ergocalciferol, (DRISDOL) 50000 UNITS CAPS capsule TAKE ONE CAPSULE EVERY DAY OR AS DIRECTED Patient taking differently: Take 50,000 Units by mouth 2 (two) times a week. Sunday and thursdays 02/24/15  Yes Courtney Forcucci, PA-C   BP 149/90 mmHg  Pulse 91  Temp(Src) 98.9 F (37.2 C) (Oral)  Resp 18  Ht 5\' 10"  (1.778 m)  Wt 238 lb (107.956 kg)  BMI 34.15 kg/m2  SpO2 97% Physical Exam  Constitutional: He is oriented to person, place, and time. He appears well-developed and well-nourished. No distress.  Pt pacing back and forth in pain  HENT:  Head: Normocephalic and atraumatic.  Eyes: Conjunctivae and EOM are normal.  Neck: Neck supple. No tracheal deviation present.  Cardiovascular: Normal rate, regular rhythm and normal heart sounds.   Pulmonary/Chest: Effort normal. No respiratory distress. He has no wheezes. He has no rales.  Abdominal: Soft. He exhibits no distension and no mass. There is no tenderness. There is no rigidity, no rebound, no guarding and no CVA tenderness.  Obese, no CVA TTP No abdominal TTP  Musculoskeletal: Normal range of motion.   Ambulatory with steady gait  Neurological: He is alert and oriented to person, place, and time.  Skin: Skin is warm and dry.  Psychiatric: He has a normal mood and affect. His behavior is normal.  Nursing note and vitals reviewed.   ED Course  Procedures   DIAGNOSTIC STUDIES: Oxygen Saturation is 96% on RA, normal by my interpretation.    COORDINATION OF CARE: 10:59 PM Discussed treatment plan with pt which includes lab work and UA. Pt agreed to plan.   Labs Review Labs Reviewed  BASIC METABOLIC PANEL - Abnormal; Notable for the following:    Chloride 98 (*)    Glucose, Bld 336 (*)    BUN 29 (*)    Creatinine, Ser 2.26 (*)    GFR calc non Af Amer 32 (*)    GFR calc Af Amer 37 (*)    All other components within normal limits  CBC WITH DIFFERENTIAL/PLATELET - Abnormal; Notable for the  following:    WBC 10.7 (*)    RBC 6.76 (*)    Hemoglobin 19.6 (*)    HCT 57.8 (*)    All other components within normal limits  URINALYSIS, ROUTINE W REFLEX MICROSCOPIC (NOT AT [facility]) - Abnormal; Notable for the following:    Specific Gravity, Urine 1.036 (*)    Glucose, UA >1000 (*)    Hgb urine dipstick SMALL (*)    All other components within normal limits  URINE MICROSCOPIC-ADD ON    Imaging Review Ct Renal Stone Study  07/16/2015   CLINICAL DATA:  Acute onset of right flank pain.  Initial encounter.  EXAM: CT ABDOMEN AND PELVIS WITHOUT CONTRAST  TECHNIQUE: Multidetector CT imaging of the abdomen and pelvis was performed following the standard protocol without IV contrast.  COMPARISON:  CT of the abdomen and pelvis from 03/16/2010, and abdominal ultrasound performed 03/09/2010  FINDINGS: The visualized lung bases are clear.  The liver and spleen are unremarkable in appearance. The gallbladder is within normal limits. The pancreas and adrenal glands are unremarkable.  There is a prominent perinephric hematoma about the right kidney, measuring up to 3.5 cm in thickness. The etiology of this  hemorrhage is uncertain, though there is a focus of slightly increased attenuation at the upper pole of the right kidney.  The left kidney is unremarkable in appearance. No renal or ureteral stones are seen. There is no evidence of hydronephrosis.  No free fluid is identified. The small bowel is unremarkable in appearance. The stomach is within normal limits. No acute vascular abnormalities are seen. Mild scattered calcification is noted along the abdominal aorta and its branches.  The appendix is normal in caliber and contains air, without evidence of appendicitis. The colon is unremarkable in appearance.  The bladder is mildly distended and grossly unremarkable. The prostate remains borderline normal in size. No inguinal lymphadenopathy is seen.  No acute osseous abnormalities are identified.  IMPRESSION: 1. Prominent right-sided perinephric hematoma noted, measuring up to 3.5 cm in thickness. The etiology of this hemorrhage is uncertain, though a focus of slightly increased attenuation is noted at the upper pole of the right kidney. This may correspond to the patient's acute renal failure. 2. Mild scattered calcification along the abdominal aorta and its branches.   Electronically Signed   By: Garald Balding M.D.   On: 07/16/2015 00:58     EKG Interpretation None      MDM   Final diagnoses:  Flank pain  Acute kidney injury  Hyperglycemia  Perinephric hematoma   Patient presents for right sided flank pain with nausea. His vitals are stable although he seems as though he is in distress and pacing back and forth in the room. Labs reveal he has acute kidney injury and uncontrolled hyperglycemia. CT renal study shows prominent right-sided perinephric hematoma measuring 3.5 cm in thickness. He has no hydronephrosis or kidney stones. I spoke to Dr. Posey Pronto regarding his acute kidney failure and hyperglycemia, and he will admit to [place]. Medications  sodium chloride 0.9 % bolus 1,000 mL (1,000 mLs  Intravenous Transfusing/Transfer 07/16/15 0229)  ondansetron (ZOFRAN) injection 4 mg (4 mg Intravenous Given 07/15/15 2350)  ketorolac (TORADOL) 30 MG/ML injection 30 mg (30 mg Intravenous Given 07/15/15 2350)  sodium chloride 0.9 % bolus 1,000 mL (1,000 mLs Intravenous New Bag/Given 07/16/15 0212)  HYDROmorphone (DILAUDID) injection 1 mg (1 mg Intravenous Given 07/16/15 0214)  I personally performed the services described in this documentation, which was scribed in my  presence. The recorded information has been reviewed and is accurate.    Ottie Glazier, PA-C 07/16/15 [place], MD 07/18/15 0700

## 2015-07-16 ENCOUNTER — Emergency Department (HOSPITAL_COMMUNITY): Payer: BLUE CROSS/BLUE SHIELD

## 2015-07-16 DIAGNOSIS — E118 Type 2 diabetes mellitus with unspecified complications: Secondary | ICD-10-CM | POA: Diagnosis not present

## 2015-07-16 DIAGNOSIS — Z87891 Personal history of nicotine dependence: Secondary | ICD-10-CM | POA: Diagnosis not present

## 2015-07-16 DIAGNOSIS — Z833 Family history of diabetes mellitus: Secondary | ICD-10-CM | POA: Diagnosis not present

## 2015-07-16 DIAGNOSIS — E1165 Type 2 diabetes mellitus with hyperglycemia: Secondary | ICD-10-CM | POA: Diagnosis present

## 2015-07-16 DIAGNOSIS — Z8249 Family history of ischemic heart disease and other diseases of the circulatory system: Secondary | ICD-10-CM | POA: Diagnosis not present

## 2015-07-16 DIAGNOSIS — Z79899 Other long term (current) drug therapy: Secondary | ICD-10-CM | POA: Diagnosis not present

## 2015-07-16 DIAGNOSIS — E785 Hyperlipidemia, unspecified: Secondary | ICD-10-CM | POA: Diagnosis present

## 2015-07-16 DIAGNOSIS — I10 Essential (primary) hypertension: Secondary | ICD-10-CM | POA: Diagnosis present

## 2015-07-16 DIAGNOSIS — Z794 Long term (current) use of insulin: Secondary | ICD-10-CM | POA: Diagnosis not present

## 2015-07-16 DIAGNOSIS — D751 Secondary polycythemia: Secondary | ICD-10-CM | POA: Diagnosis present

## 2015-07-16 DIAGNOSIS — N179 Acute kidney failure, unspecified: Secondary | ICD-10-CM | POA: Diagnosis present

## 2015-07-16 DIAGNOSIS — S37019A Minor contusion of unspecified kidney, initial encounter: Secondary | ICD-10-CM

## 2015-07-16 DIAGNOSIS — E119 Type 2 diabetes mellitus without complications: Secondary | ICD-10-CM | POA: Diagnosis not present

## 2015-07-16 DIAGNOSIS — J329 Chronic sinusitis, unspecified: Secondary | ICD-10-CM | POA: Diagnosis present

## 2015-07-16 DIAGNOSIS — R7611 Nonspecific reaction to tuberculin skin test without active tuberculosis: Secondary | ICD-10-CM | POA: Diagnosis present

## 2015-07-16 DIAGNOSIS — D649 Anemia, unspecified: Secondary | ICD-10-CM | POA: Diagnosis present

## 2015-07-16 DIAGNOSIS — J309 Allergic rhinitis, unspecified: Secondary | ICD-10-CM | POA: Diagnosis present

## 2015-07-16 DIAGNOSIS — E291 Testicular hypofunction: Secondary | ICD-10-CM | POA: Diagnosis present

## 2015-07-16 DIAGNOSIS — Z7982 Long term (current) use of aspirin: Secondary | ICD-10-CM | POA: Diagnosis not present

## 2015-07-16 DIAGNOSIS — N2889 Other specified disorders of kidney and ureter: Secondary | ICD-10-CM | POA: Diagnosis present

## 2015-07-16 HISTORY — DX: Minor contusion of unspecified kidney, initial encounter: S37.019A

## 2015-07-16 LAB — CBC
HCT: 52.4 % — ABNORMAL HIGH (ref 39.0–52.0)
Hemoglobin: 17.7 g/dL — ABNORMAL HIGH (ref 13.0–17.0)
MCH: 29 pg (ref 26.0–34.0)
MCHC: 33.8 g/dL (ref 30.0–36.0)
MCV: 85.8 fL (ref 78.0–100.0)
Platelets: 254 10*3/uL (ref 150–400)
RBC: 6.11 MIL/uL — ABNORMAL HIGH (ref 4.22–5.81)
RDW: 13.7 % (ref 11.5–15.5)
WBC: 10.8 10*3/uL — ABNORMAL HIGH (ref 4.0–10.5)

## 2015-07-16 LAB — COMPREHENSIVE METABOLIC PANEL
ALT: 24 U/L (ref 17–63)
AST: 23 U/L (ref 15–41)
Albumin: 3.6 g/dL (ref 3.5–5.0)
Alkaline Phosphatase: 57 U/L (ref 38–126)
Anion gap: 10 (ref 5–15)
BUN: 30 mg/dL — ABNORMAL HIGH (ref 6–20)
CO2: 25 mmol/L (ref 22–32)
Calcium: 9.2 mg/dL (ref 8.9–10.3)
Chloride: 101 mmol/L (ref 101–111)
Creatinine, Ser: 2.3 mg/dL — ABNORMAL HIGH (ref 0.61–1.24)
GFR calc Af Amer: 36 mL/min — ABNORMAL LOW (ref >60)
GFR calc non Af Amer: 31 mL/min — ABNORMAL LOW (ref >60)
Glucose, Bld: 270 mg/dL — ABNORMAL HIGH (ref 65–99)
Potassium: 4.6 mmol/L (ref 3.5–5.1)
Sodium: 136 mmol/L (ref 135–145)
Total Bilirubin: 0.7 mg/dL (ref 0.3–1.2)
Total Protein: 6.6 g/dL (ref 6.5–8.1)

## 2015-07-16 LAB — URINE MICROSCOPIC-ADD ON

## 2015-07-16 LAB — URINALYSIS, ROUTINE W REFLEX MICROSCOPIC
Bilirubin Urine: NEGATIVE
Glucose, UA: >1000 mg/dL — AB
Ketones, ur: NEGATIVE mg/dL
Leukocytes, UA: NEGATIVE
Nitrite: NEGATIVE
Protein, ur: NEGATIVE mg/dL
Specific Gravity, Urine: 1.036 — ABNORMAL HIGH (ref 1.005–1.030)
Urobilinogen, UA: 0.2 mg/dL (ref 0.0–1.0)
pH: 5 (ref 5.0–8.0)

## 2015-07-16 LAB — PROTIME-INR
INR: 1.13 (ref 0.00–1.49)
Prothrombin Time: 14.7 seconds (ref 11.6–15.2)

## 2015-07-16 LAB — GLUCOSE, CAPILLARY
Glucose-Capillary: 267 mg/dL — ABNORMAL HIGH (ref 65–99)
Glucose-Capillary: 244 mg/dL — ABNORMAL HIGH (ref 65–99)
Glucose-Capillary: 199 mg/dL — ABNORMAL HIGH (ref 65–99)
Glucose-Capillary: 183 mg/dL — ABNORMAL HIGH (ref 65–99)

## 2015-07-16 MED ORDER — INSULIN DETEMIR 100 UNIT/ML ~~LOC~~ SOLN
15.0000 [IU] | Freq: Every day | SUBCUTANEOUS | Status: DC
  Administered 2015-07-16: 15 [IU] via SUBCUTANEOUS
  Filled 2015-07-16 (×2): qty 0.15

## 2015-07-16 MED ORDER — SODIUM CHLORIDE 0.9 % IV SOLN
INTRAVENOUS | Status: DC
  Administered 2015-07-16: 04:00:00 via INTRAVENOUS

## 2015-07-16 MED ORDER — EZETIMIBE 10 MG PO TABS
10.0000 mg | ORAL_TABLET | Freq: Every day | ORAL | Status: DC
  Administered 2015-07-16 – 2015-07-18 (×3): 10 mg via ORAL
  Filled 2015-07-16 (×3): qty 1

## 2015-07-16 MED ORDER — SODIUM CHLORIDE 0.9 % IJ SOLN
3.0000 mL | Freq: Two times a day (BID) | INTRAMUSCULAR | Status: DC
  Administered 2015-07-16 – 2015-07-18 (×4): 3 mL via INTRAVENOUS

## 2015-07-16 MED ORDER — ONDANSETRON HCL 4 MG/2ML IJ SOLN: 4.0000 mg | Freq: Four times a day (QID) | INTRAMUSCULAR | Status: DC | PRN

## 2015-07-16 MED ORDER — BISOPROLOL FUMARATE 10 MG PO TABS
10.0000 mg | ORAL_TABLET | Freq: Every day | ORAL | Status: DC
  Administered 2015-07-16 – 2015-07-18 (×3): 10 mg via ORAL
  Filled 2015-07-16 (×4): qty 1

## 2015-07-16 MED ORDER — OXYCODONE-ACETAMINOPHEN 5-325 MG PO TABS
1.0000 | ORAL_TABLET | ORAL | Status: DC | PRN
  Administered 2015-07-16: 1 via ORAL
  Filled 2015-07-16: qty 1

## 2015-07-16 MED ORDER — ACETAMINOPHEN 650 MG RE SUPP: 650.0000 mg | Freq: Four times a day (QID) | RECTAL | Status: DC | PRN

## 2015-07-16 MED ORDER — INSULIN ASPART 100 UNIT/ML ~~LOC~~ SOLN: 0.0000 [IU] | Freq: Every day | SUBCUTANEOUS | Status: DC

## 2015-07-16 MED ORDER — HYDROMORPHONE HCL 1 MG/ML IJ SOLN
1.0000 mg | Freq: Once | INTRAMUSCULAR | Status: AC
  Administered 2015-07-16: 1 mg via INTRAVENOUS
  Filled 2015-07-16: qty 1

## 2015-07-16 MED ORDER — ACETAMINOPHEN 325 MG PO TABS
650.0000 mg | ORAL_TABLET | Freq: Four times a day (QID) | ORAL | Status: DC | PRN
  Administered 2015-07-16: 650 mg via ORAL
  Filled 2015-07-16: qty 2

## 2015-07-16 MED ORDER — HYDROMORPHONE HCL 1 MG/ML IJ SOLN
1.0000 mg | INTRAMUSCULAR | Status: DC | PRN
  Administered 2015-07-16 – 2015-07-17 (×6): 1 mg via INTRAVENOUS
  Filled 2015-07-16 (×7): qty 1

## 2015-07-16 MED ORDER — INSULIN ASPART 100 UNIT/ML ~~LOC~~ SOLN
0.0000 [IU] | Freq: Three times a day (TID) | SUBCUTANEOUS | Status: DC
  Administered 2015-07-16: 3 [IU] via SUBCUTANEOUS
  Administered 2015-07-16 (×2): 5 [IU] via SUBCUTANEOUS
  Administered 2015-07-17: 3 [IU] via SUBCUTANEOUS
  Administered 2015-07-17 (×2): 5 [IU] via SUBCUTANEOUS
  Administered 2015-07-18 (×2): 3 [IU] via SUBCUTANEOUS
  Administered 2015-07-18: 2 [IU] via SUBCUTANEOUS

## 2015-07-16 MED ORDER — SODIUM CHLORIDE 0.9 % IV BOLUS (SEPSIS)
1000.0000 mL | Freq: Once | INTRAVENOUS | Status: AC
Start: 2015-07-16 — End: 2015-07-16
  Administered 2015-07-16: 1000 mL via INTRAVENOUS

## 2015-07-16 MED ORDER — ATORVASTATIN CALCIUM 10 MG PO TABS
20.0000 mg | ORAL_TABLET | Freq: Every day | ORAL | Status: DC
  Administered 2015-07-16 – 2015-07-18 (×3): 20 mg via ORAL
  Filled 2015-07-16 (×3): qty 2

## 2015-07-16 MED ORDER — ONDANSETRON HCL 4 MG PO TABS: 4.0000 mg | ORAL_TABLET | Freq: Four times a day (QID) | ORAL | Status: DC | PRN

## 2015-07-16 MED ORDER — HYDROCODONE-ACETAMINOPHEN 5-325 MG PO TABS
1.0000 | ORAL_TABLET | ORAL | Status: DC | PRN
  Administered 2015-07-16: 1 via ORAL
  Filled 2015-07-16: qty 1

## 2015-07-16 NOTE — H&P (Signed)
[facility] History and Physical  Patient: Russell [name]  MRN: [id number]  DOB: June 25, 1961  DOS: the patient was seen and examined on 07/16/2015 PCP: Alesia Richards, MD  Referring physician: Ottie Glazier, PA-C Chief Complaint: Right-sided flank pain  HPI: Russell [name] is a 54 y.o. male with Past medical history of diabetes mellitus type 2, hypertension, hypogonadism, dyslipidemia. The patient is presenting with complaints of right-sided flank pain that started today while he was eating. The pain was sharp and stabbing and the did not resolve and therefore he decided to come to the hospital. He denies any fever or chills denies any burning urination denies any active bleeding denies any fall trauma or injury denies any cough denies any chest pain denies any abdominal pain denies any nausea or vomiting no diarrhea or constipation. He denies any recent change in his medications.  The patient is coming from home.  At his baseline ambulates without any support And is independent for most of his ADL manages her medication on his own.  Review of Systems: as mentioned in the history of present illness.  A comprehensive review of the other systems is negative.  Past Medical History  Diagnosis Date  . Hypertension   . Hyperlipidemia   . Diabetes mellitus   . Hypogonadism male   . Chronic sinusitis   . Allergy   . Allergic rhinitis    Past Surgical History  Procedure Laterality Date  . Wisdom tooth extraction    . Hernia repair     Social History:  reports that he quit smoking about 14 years ago. His smoking use included Cigarettes. He has a 2.4 pack-year smoking history. He does not have any smokeless tobacco history on file. He reports that he drinks alcohol. He reports that he does not use illicit drugs.  Allergies  Allergen Reactions  . Ppd [Tuberculin Purified Protein Derivative] Other (See Comments)    POSITIVE REACTION IN PAST 11/21/11 CXR NEG    Family  History  Problem Relation Age of Onset  . Diabetes Mother   . Hypertension Mother   . Hyperlipidemia Mother   . Diabetes Father   . Hypertension Father   . Hyperlipidemia Father   . Diabetes Brother   . Hypertension Brother     Prior to Admission medications   Medication Sig Start Date End Date Taking? Authorizing Provider  aspirin 81 MG tablet Take 162 mg by mouth daily.    Yes Historical Provider, MD  atorvastatin (LIPITOR) 40 MG tablet Take 0.5 tablets (20 mg total) by mouth daily. 03/08/15  Yes Unk Pinto, MD  bisoprolol-hydrochlorothiazide [facility]) 10-6.25 MG per tablet TAKE 1 TABLET BY MOUTH EVERY DAY 04/26/15  Yes Unk Pinto, MD  ciclesonide (OMNARIS) 50 MCG/ACT nasal spray Place 2 sprays into both nostrils daily as needed for allergies.    Yes Historical Provider, MD  clotrimazole-betamethasone (LOTRISONE) cream Apply 3 times a day as needed Patient taking differently: Apply 1 application topically 3 (three) times daily as needed (rash). Apply 3 times a day as needed 09/24/14  Yes Unk Pinto, MD  ezetimibe (ZETIA) 10 MG tablet Take 1 tablet (10 mg total) by mouth daily. for cholesterol 03/03/15  Yes Unk Pinto, MD  fluconazole (DIFLUCAN) 150 MG tablet Take 1 tab by mouth once a week Patient taking differently: Take 150 mg by mouth See admin instructions. Take 1 tab by mouth once a week as needed for yeast infections 09/24/14  Yes Unk Pinto, MD  hydrochlorothiazide (HYDRODIURIL) 25 MG  tablet TAKE 1/2 TO 1 TABLET DAILY AS DIRECTED FOR BP & FLUID 07/01/15  Yes Unk Pinto, MD  Insulin Pen Needle 31G X 5 MM MISC 1 qd 11/03/13  Yes Melissa Smith, PA-C  Insulin Syringe-Needle U-100 31G X 15/64" 0.3 ML MISC Use as directed 04/08/15  Yes Courtney Forcucci, PA-C  INVOKANA 300 MG TABS tablet TAKE 1 TABLET BY MOUTH EVERY DAY 04/07/15  Yes Unk Pinto, MD  LEVEMIR FLEXTOUCH 100 UNIT/ML Pen START WITH 10 UNITS AND TITRATE AS DIRECTED Patient taking differently: 15  units every morning 07/03/14  Yes Unk Pinto, MD  losartan (COZAAR) 100 MG tablet TAKE 1 TABLET (100 MG TOTAL) BY MOUTH DAILY. 08/02/14  Yes Unk Pinto, MD  metFORMIN (GLUCOPHAGE) 500 MG tablet Take 1,000 mg by mouth 2 (two) times daily with a meal.   Yes Historical Provider, MD  tadalafil (CIALIS) 20 MG tablet Take 1/2 to 1 tablet every 2-3 days as needed Patient taking differently: Take 10-20 mg by mouth daily as needed for erectile dysfunction. Take 1/2 to 1 tablet every 2-3 days as needed 06/03/15  Yes Vicie Mutters, PA-C  testosterone cypionate (DEPOTESTOTERONE CYPIONATE) 200 MG/ML injection Inject 2 mLs (400 mg total) into the muscle every 14 (fourteen) days. 04/08/15  Yes Courtney Forcucci, PA-C  Vitamin D, Ergocalciferol, (DRISDOL) 50000 UNITS CAPS capsule TAKE ONE CAPSULE EVERY DAY OR AS DIRECTED Patient taking differently: Take 50,000 Units by mouth 2 (two) times a week. Sunday and thursdays 02/24/15  Yes Courtney Forcucci, PA-C    Physical Exam: Filed Vitals:   07/15/15 2221 07/16/15 0236 07/16/15 0500 07/16/15 0601  BP: 137/92 149/90  159/89  Pulse: 79 91  88  Temp: 97.9 F (36.6 C) 98.9 F (37.2 C)  99 F (37.2 C)  TempSrc: Oral Oral  Oral  Resp: 20 18  14   Height: 5\' 10"  (1.778 m)     Weight: 107.956 kg (238 lb)  104.327 kg (230 lb)   SpO2: 96% 97%  97%    General: Alert, Awake and Oriented to Time, Place and Person. Appear in mild distress Eyes: PERRL ENT: Oral Mucosa clear moist. Neck: no JVD Cardiovascular: S1 and S2 Present, no Murmur, Peripheral Pulses Present Respiratory: Bilateral Air entry equal and Decreased,  Clear to Auscultation, no Crackles, no wheezes Abdomen: Bowel Sound present, Soft and right-sided CVA tender Skin: no Rash Extremities: no Pedal edema, no calf tenderness Neurologic: Grossly no focal neuro deficit.  Labs on Admission:  CBC:  Recent Labs Lab 07/15/15 2318 07/16/15 0509  WBC 10.7* 10.8*  NEUTROABS 7.1  --   HGB 19.6*  17.7*  HCT 57.8* 52.4*  MCV 85.5 85.8  PLT 267 254    CMP     Component Value Date/Time   NA 136 07/16/2015 0509   K 4.6 07/16/2015 0509   CL 101 07/16/2015 0509   CO2 25 07/16/2015 0509   GLUCOSE 270* 07/16/2015 0509   BUN 30* 07/16/2015 0509   CREATININE 2.30* 07/16/2015 0509   CREATININE 0.97 02/24/2015 1619   CALCIUM 9.2 07/16/2015 0509   PROT 6.6 07/16/2015 0509   ALBUMIN 3.6 07/16/2015 0509   AST 23 07/16/2015 0509   ALT 24 07/16/2015 0509   ALKPHOS 57 07/16/2015 0509   BILITOT 0.7 07/16/2015 0509   GFRNONAA 31* 07/16/2015 0509   GFRNONAA 89 02/24/2015 1619   GFRAA 36* 07/16/2015 0509   GFRAA >89 02/24/2015 1619    No results for input(s): LIPASE, AMYLASE in the last 168 hours.  No results for input(s): CKTOTAL, CKMB, CKMBINDEX, TROPONINI in the last 168 hours. BNP (last 3 results) No results for input(s): BNP in the last 8760 hours.  ProBNP (last 3 results) No results for input(s): PROBNP in the last 8760 hours.   Radiological Exams on Admission: Ct Renal Stone Study  07/16/2015   CLINICAL DATA:  Acute onset of right flank pain.  Initial encounter.  EXAM: CT ABDOMEN AND PELVIS WITHOUT CONTRAST  TECHNIQUE: Multidetector CT imaging of the abdomen and pelvis was performed following the standard protocol without IV contrast.  COMPARISON:  CT of the abdomen and pelvis from 03/16/2010, and abdominal ultrasound performed 03/09/2010  FINDINGS: The visualized lung bases are clear.  The liver and spleen are unremarkable in appearance. The gallbladder is within normal limits. The pancreas and adrenal glands are unremarkable.  There is a prominent perinephric hematoma about the right kidney, measuring up to 3.5 cm in thickness. The etiology of this hemorrhage is uncertain, though there is a focus of slightly increased attenuation at the upper pole of the right kidney.  The left kidney is unremarkable in appearance. No renal or ureteral stones are seen. There is no evidence of  hydronephrosis.  No free fluid is identified. The small bowel is unremarkable in appearance. The stomach is within normal limits. No acute vascular abnormalities are seen. Mild scattered calcification is noted along the abdominal aorta and its branches.  The appendix is normal in caliber and contains air, without evidence of appendicitis. The colon is unremarkable in appearance.  The bladder is mildly distended and grossly unremarkable. The prostate remains borderline normal in size. No inguinal lymphadenopathy is seen.  No acute osseous abnormalities are identified.  IMPRESSION: 1. Prominent right-sided perinephric hematoma noted, measuring up to 3.5 cm in thickness. The etiology of this hemorrhage is uncertain, though a focus of slightly increased attenuation is noted at the upper pole of the right kidney. This may correspond to the patient's acute renal failure. 2. Mild scattered calcification along the abdominal aorta and its branches.   Electronically Signed   By: Garald Balding M.D.   On: 07/16/2015 00:58   Assessment/Plan Principal Problem:   AKI (acute kidney injury) Active Problems:   Hypertension   Perinephric hematoma   DM (diabetes mellitus), type 2   1. AKI (acute kidney injury) The patient is presenting with complaints of right-sided CVA pain. CT scan is showing right-sided perinephric hematoma. Urology recommends conservative management with outpatient follow-up. Currently due to acute kidney injury the patient will be admitted in the hospital. We will give him IV hydration as well as holding aspirin as well as other nephrotoxic medications. The patient will be monitored on telemetry.  2. Diabetes mellitus. Holding metformin also recommended to hold her outpatient long acting diabetes medication due to AKI. Placing him on sliding scale.  3. Essential hypertension. Holding losartan. Continuing other medications for the blood pressure.  Advance goals of care discussion: Full  code   Consults: Urology  DVT Prophylaxis: mechanical compression device Nutrition: Renal diet  Family Communication: family was present at bedside, opportunity was given to ask question and all questions were answered satisfactorily at the time of interview. Disposition: Admitted as inpatient, telemetry unit.  Author: Berle Mull, MD [facility] Pager: [phone] 07/16/2015  If 7PM-7AM, please contact night-coverage [email or web address] Password [email or web address]

## 2015-07-16 NOTE — Progress Notes (Signed)
Utilization review completed.  

## 2015-07-16 NOTE — Progress Notes (Signed)
Pt with increasing pain since arrival to floor. 650 mg PO tylenol administered at 0530. Pain rating currently 10/10. NP on call notified. Awaiting orders. Nursing will contiue to monitor.

## 2015-07-16 NOTE — Consult Note (Signed)
[facility] Renal Consultation Note  Requesting MD: [place] Indication for Consultation: AKI and renal hemorrhage  HPI:  Russell [name] is a 54 y.o. male with PMhx significant for type 2 DM, HTN , hyperlipidemia who was noted to have normal renal function in March of this year.  He presented to the ER last night with right sided flank pain- imaging found a right sided renal hemorrhage possibly with an underlying cyst but no associated hydronephrosis, the left kidney was unremarkable.  His creatinine was found to be 2.26. He was on losartan and invokana as an OP but those meds have been held.  He did get a dose of toradol in the ER and he tells me he took a couple doses of advil as OP.  We are asked to see for management of this acute kidney injury.  Urology was called for this renal hemorrhage and they recommended conservative management.  He is c/o pain.  Not much UOP recorded but he says he has been going often especially with the IVF.  Also of note- his hgb was high at 19, now 17.7- he does not smoke- this is longstanding based on labs from 2015  CREAT  Date/Time Value Ref Range Status  02/24/2015 04:19 PM 0.97 0.50 - 1.35 mg/dL Final  10/08/2014 10:39 AM 1.09 0.50 - 1.35 mg/dL Final  07/02/2014 10:27 AM 1.22 0.50 - 1.35 mg/dL Final  06/04/2014 09:14 AM 1.07 0.50 - 1.35 mg/dL Final  04/02/2014 10:03 AM 1.23 0.50 - 1.35 mg/dL Final  01/06/2014 06:00 PM 1.13 0.50 - 1.35 mg/dL Final  10/23/2013 09:57 AM 1.26 0.50 - 1.35 mg/dL Final   CREATININE, SER  Date/Time Value Ref Range Status  07/16/2015 05:09 AM 2.30* 0.61 - 1.24 mg/dL Final  07/15/2015 11:18 PM 2.26* 0.61 - 1.24 mg/dL Final     PMHx:   Past Medical History  Diagnosis Date  . Hypertension   . Hyperlipidemia   . Diabetes mellitus   . Hypogonadism male   . Chronic sinusitis   . Allergy   . Allergic rhinitis     Past Surgical History  Procedure Laterality Date  . Wisdom tooth extraction    . Hernia repair       Family Hx:  Family History  Problem Relation Age of Onset  . Diabetes Mother   . Hypertension Mother   . Hyperlipidemia Mother   . Diabetes Father   . Hypertension Father   . Hyperlipidemia Father   . Diabetes Brother   . Hypertension Brother     Social History:  reports that he quit smoking about 14 years ago. His smoking use included Cigarettes. He has a 2.4 pack-year smoking history. He does not have any smokeless tobacco history on file. He reports that he drinks alcohol. He reports that he does not use illicit drugs.  Allergies:  Allergies  Allergen Reactions  . Ppd [Tuberculin Purified Protein Derivative] Other (See Comments)    POSITIVE REACTION IN PAST 11/21/11 CXR NEG    Medications: Prior to Admission medications   Medication Sig Start Date End Date Taking? Authorizing Provider  aspirin 81 MG tablet Take 162 mg by mouth daily.    Yes Historical Provider, MD  atorvastatin (LIPITOR) 40 MG tablet Take 0.5 tablets (20 mg total) by mouth daily. 03/08/15  Yes Unk Pinto, MD  bisoprolol-hydrochlorothiazide [facility]) 10-6.25 MG per tablet TAKE 1 TABLET BY MOUTH EVERY DAY 04/26/15  Yes Unk Pinto, MD  ciclesonide (OMNARIS) 50 MCG/ACT nasal spray Place  2 sprays into both nostrils daily as needed for allergies.    Yes Historical Provider, MD  clotrimazole-betamethasone (LOTRISONE) cream Apply 3 times a day as needed Patient taking differently: Apply 1 application topically 3 (three) times daily as needed (rash). Apply 3 times a day as needed 09/24/14  Yes Unk Pinto, MD  ezetimibe (ZETIA) 10 MG tablet Take 1 tablet (10 mg total) by mouth daily. for cholesterol 03/03/15  Yes Unk Pinto, MD  fluconazole (DIFLUCAN) 150 MG tablet Take 1 tab by mouth once a week Patient taking differently: Take 150 mg by mouth See admin instructions. Take 1 tab by mouth once a week as needed for yeast infections 09/24/14  Yes Unk Pinto, MD  hydrochlorothiazide (HYDRODIURIL) 25 MG  tablet TAKE 1/2 TO 1 TABLET DAILY AS DIRECTED FOR BP & FLUID 07/01/15  Yes Unk Pinto, MD  Insulin Pen Needle 31G X 5 MM MISC 1 qd 11/03/13  Yes Melissa Smith, PA-C  Insulin Syringe-Needle U-100 31G X 15/64" 0.3 ML MISC Use as directed 04/08/15  Yes Courtney Forcucci, PA-C  INVOKANA 300 MG TABS tablet TAKE 1 TABLET BY MOUTH EVERY DAY 04/07/15  Yes Unk Pinto, MD  LEVEMIR FLEXTOUCH 100 UNIT/ML Pen START WITH 10 UNITS AND TITRATE AS DIRECTED Patient taking differently: 15 units every morning 07/03/14  Yes Unk Pinto, MD  losartan (COZAAR) 100 MG tablet TAKE 1 TABLET (100 MG TOTAL) BY MOUTH DAILY. 08/02/14  Yes Unk Pinto, MD  metFORMIN (GLUCOPHAGE) 500 MG tablet Take 1,000 mg by mouth 2 (two) times daily with a meal.   Yes Historical Provider, MD  tadalafil (CIALIS) 20 MG tablet Take 1/2 to 1 tablet every 2-3 days as needed Patient taking differently: Take 10-20 mg by mouth daily as needed for erectile dysfunction. Take 1/2 to 1 tablet every 2-3 days as needed 06/03/15  Yes Vicie Mutters, PA-C  testosterone cypionate (DEPOTESTOTERONE CYPIONATE) 200 MG/ML injection Inject 2 mLs (400 mg total) into the muscle every 14 (fourteen) days. 04/08/15  Yes Courtney Forcucci, PA-C  Vitamin D, Ergocalciferol, (DRISDOL) 50000 UNITS CAPS capsule TAKE ONE CAPSULE EVERY DAY OR AS DIRECTED Patient taking differently: Take 50,000 Units by mouth 2 (two) times a week. Sunday and thursdays 02/24/15  Yes Courtney Forcucci, PA-C    I have reviewed the patient's current medications.  Labs:  Results for orders placed or performed during the hospital encounter of 07/15/15 (from the past 48 hour(s))  Urinalysis, Routine w reflex microscopic (not at [facility])     Status: Abnormal   Collection Time: 07/15/15 11:12 PM  Result Value Ref Range   Color, Urine YELLOW YELLOW   APPearance CLEAR CLEAR   Specific Gravity, Urine 1.036 (H) 1.005 - 1.030   pH 5.0 5.0 - 8.0   Glucose, UA >1000 (A) NEGATIVE mg/dL   Hgb urine  dipstick SMALL (A) NEGATIVE   Bilirubin Urine NEGATIVE NEGATIVE   Ketones, ur NEGATIVE NEGATIVE mg/dL   Protein, ur NEGATIVE NEGATIVE mg/dL   Urobilinogen, UA 0.2 0.0 - 1.0 mg/dL   Nitrite NEGATIVE NEGATIVE   Leukocytes, UA NEGATIVE NEGATIVE  Urine microscopic-add on     Status: None   Collection Time: 07/15/15 11:12 PM  Result Value Ref Range   Squamous Epithelial / LPF RARE RARE   WBC, UA 0-2 <3 WBC/hpf   RBC / HPF 3-6 <3 RBC/hpf   Bacteria, UA RARE RARE  Basic metabolic panel     Status: Abnormal   Collection Time: 07/15/15 11:18 PM  Result Value Ref  Range   Sodium 135 135 - 145 mmol/L   Potassium 4.1 3.5 - 5.1 mmol/L   Chloride 98 (L) 101 - 111 mmol/L   CO2 25 22 - 32 mmol/L   Glucose, Bld 336 (H) 65 - 99 mg/dL   BUN 29 (H) 6 - 20 mg/dL   Creatinine, Ser 2.26 (H) 0.61 - 1.24 mg/dL   Calcium 10.1 8.9 - 10.3 mg/dL   GFR calc non Af Amer 32 (L) >60 mL/min   GFR calc Af Amer 37 (L) >60 mL/min    Comment: (NOTE) The eGFR has been calculated using the CKD EPI equation. This calculation has not been validated in all clinical situations. eGFR's persistently <60 mL/min signify possible Chronic Kidney Disease.    Anion gap 12 5 - 15  CBC with Differential     Status: Abnormal   Collection Time: 07/15/15 11:18 PM  Result Value Ref Range   WBC 10.7 (H) 4.0 - 10.5 K/uL   RBC 6.76 (H) 4.22 - 5.81 MIL/uL   Hemoglobin 19.6 (H) 13.0 - 17.0 g/dL   HCT 57.8 (H) 39.0 - 52.0 %   MCV 85.5 78.0 - 100.0 fL   MCH 29.0 26.0 - 34.0 pg   MCHC 33.9 30.0 - 36.0 g/dL   RDW 13.6 11.5 - 15.5 %   Platelets 267 150 - 400 K/uL   Neutrophils Relative % 67 43 - 77 %   Neutro Abs 7.1 1.7 - 7.7 K/uL   Lymphocytes Relative 23 12 - 46 %   Lymphs Abs 2.5 0.7 - 4.0 K/uL   Monocytes Relative 9 3 - 12 %   Monocytes Absolute 1.0 0.1 - 1.0 K/uL   Eosinophils Relative 1 0 - 5 %   Eosinophils Absolute 0.1 0.0 - 0.7 K/uL   Basophils Relative 0 0 - 1 %   Basophils Absolute 0.0 0.0 - 0.1 K/uL  Comprehensive  metabolic panel     Status: Abnormal   Collection Time: 07/16/15  5:09 AM  Result Value Ref Range   Sodium 136 135 - 145 mmol/L   Potassium 4.6 3.5 - 5.1 mmol/L   Chloride 101 101 - 111 mmol/L   CO2 25 22 - 32 mmol/L   Glucose, Bld 270 (H) 65 - 99 mg/dL   BUN 30 (H) 6 - 20 mg/dL   Creatinine, Ser 2.30 (H) 0.61 - 1.24 mg/dL   Calcium 9.2 8.9 - 10.3 mg/dL   Total Protein 6.6 6.5 - 8.1 g/dL   Albumin 3.6 3.5 - 5.0 g/dL   AST 23 15 - 41 U/L   ALT 24 17 - 63 U/L   Alkaline Phosphatase 57 38 - 126 U/L   Total Bilirubin 0.7 0.3 - 1.2 mg/dL   GFR calc non Af Amer 31 (L) >60 mL/min   GFR calc Af Amer 36 (L) >60 mL/min    Comment: (NOTE) The eGFR has been calculated using the CKD EPI equation. This calculation has not been validated in all clinical situations. eGFR's persistently <60 mL/min signify possible Chronic Kidney Disease.    Anion gap 10 5 - 15  CBC     Status: Abnormal   Collection Time: 07/16/15  5:09 AM  Result Value Ref Range   WBC 10.8 (H) 4.0 - 10.5 K/uL   RBC 6.11 (H) 4.22 - 5.81 MIL/uL   Hemoglobin 17.7 (H) 13.0 - 17.0 g/dL   HCT 52.4 (H) 39.0 - 52.0 %   MCV 85.8 78.0 - 100.0 fL   MCH  29.0 26.0 - 34.0 pg   MCHC 33.8 30.0 - 36.0 g/dL   RDW 13.7 11.5 - 15.5 %   Platelets 254 150 - 400 K/uL  Protime-INR     Status: None   Collection Time: 07/16/15  5:09 AM  Result Value Ref Range   Prothrombin Time 14.7 11.6 - 15.2 seconds   INR 1.13 0.00 - 1.49  Glucose, capillary     Status: Abnormal   Collection Time: 07/16/15  6:43 AM  Result Value Ref Range   Glucose-Capillary 267 (H) 65 - 99 mg/dL  Glucose, capillary     Status: Abnormal   Collection Time: 07/16/15 11:23 AM  Result Value Ref Range   Glucose-Capillary 244 (H) 65 - 99 mg/dL   Comment 1 Notify RN      ROS:  A comprehensive review of systems was negative except for: Genitourinary: positive for right flank pain  Physical Exam: Filed Vitals:   07/16/15 0601  BP: 159/89  Pulse: 88  Temp: 99 F (37.2  C)  Resp: 14     General: obese, pleasant, talkative BM in NAD HEENT: PERRLA, EOMI, mucous membranes moist Neck: no JVD Heart: RRR Lungs: clear Abdomen: soft, tender on the right side Extremities: trace edema Skin: warm and dry Neuro: alert, non focal   Assessment/Plan: 54 year old BM with HTN, DM on losartan and invokana with normal renal function in March.  He presents with a unilateral renal hemorrhage associated with AKI 1.Renal- AKI in the setting of above.  The change in renal function is most likely due to the hemorrhage and some acute glomerulus dysfunction although seems out of proportion because I have seen other patients not have any change in GFR in this setting.  Could medications such as the losartan/invokana and NSAIDS have contributed ?? In any event, all those meds are on hold.  U/A is pretty unremarkable for other etiologies of AKI.  He has been getting IVF but dont feel like is dry at this time so will stop but told him to continue drinking and eating.  I anticipate renal function to improve- once it is heading in the right direction and pain is under control could manage as an OP  2. Hypertension/volume  - blood pressure if anything is high- will stop IVF 3. Renal hemorrhage- usually these are self limiting.  Most concern would be a renal tumor underlying the hemorrhage and that is why follow up imaging and with urology is important. Pain control without NSAIDS 4. Anemia  - actually seems to have polycythemia at baseline- at least since 2015  Thank you for this consult.  We will continue to follow with you    Walker Sitar A 07/16/2015, 11:59 AM

## 2015-07-16 NOTE — Progress Notes (Signed)
Subjective: Patient admitted this morning, see detailed H&P by Dr. Posey Pronto 54 year old male with history of diabetes, hypertension, hyperlipidemia came to the hospital with right flank pain. Imaging showed renal hemorrhage. Also patient found to have acute kidney injury, with creatinine 2.30. This morning, patient continues to have right flank pain. Wants something stronger medication for the pain.  Filed Vitals:   07/16/15 1411  BP: 148/88  Pulse: 84  Temp: 98.9 F (37.2 C)  Resp: 14    Chest: Clear Bilaterally Heart : S1S2 RRR Abdomen: Soft, right CVA tenderness  Ext : No edema Neuro: Alert, oriented x 3  A/P Acute kidney injury Renal hemorrhage Diabetes mellitus  We'll consult nephrology for further conditions, admitting hospitalist discussed with the urology and they recommend outpatient follow-up. Will change the pain medication to Dilaudid 4 mg every 4 hours when necessary  [facility] Pager- [phone]

## 2015-07-17 LAB — RENAL FUNCTION PANEL
Albumin: 3.7 g/dL (ref 3.5–5.0)
Anion gap: 10 (ref 5–15)
BUN: 20 mg/dL (ref 6–20)
CO2: 25 mmol/L (ref 22–32)
Calcium: 9.2 mg/dL (ref 8.9–10.3)
Chloride: 99 mmol/L — ABNORMAL LOW (ref 101–111)
Creatinine, Ser: 1.71 mg/dL — ABNORMAL HIGH (ref 0.61–1.24)
GFR calc Af Amer: 51 mL/min — ABNORMAL LOW (ref >60)
GFR calc non Af Amer: 44 mL/min — ABNORMAL LOW (ref >60)
Glucose, Bld: 199 mg/dL — ABNORMAL HIGH (ref 65–99)
Phosphorus: 2.9 mg/dL (ref 2.5–4.6)
Potassium: 4.3 mmol/L (ref 3.5–5.1)
Sodium: 134 mmol/L — ABNORMAL LOW (ref 135–145)

## 2015-07-17 LAB — GLUCOSE, CAPILLARY
Glucose-Capillary: 233 mg/dL — ABNORMAL HIGH (ref 65–99)
Glucose-Capillary: 223 mg/dL — ABNORMAL HIGH (ref 65–99)
Glucose-Capillary: 186 mg/dL — ABNORMAL HIGH (ref 65–99)
Glucose-Capillary: 173 mg/dL — ABNORMAL HIGH (ref 65–99)

## 2015-07-17 MED ORDER — OXYCODONE HCL 5 MG PO TABS
10.0000 mg | ORAL_TABLET | ORAL | Status: DC | PRN
  Administered 2015-07-17 – 2015-07-18 (×4): 10 mg via ORAL
  Filled 2015-07-17 (×5): qty 2

## 2015-07-17 MED ORDER — BISACODYL 10 MG RE SUPP
10.0000 mg | Freq: Every day | RECTAL | Status: DC | PRN
  Administered 2015-07-17: 10 mg via RECTAL
  Filled 2015-07-17: qty 1

## 2015-07-17 MED ORDER — METFORMIN HCL 500 MG PO TABS
1000.0000 mg | ORAL_TABLET | Freq: Two times a day (BID) | ORAL | Status: DC
  Administered 2015-07-17 – 2015-07-18 (×2): 1000 mg via ORAL
  Filled 2015-07-17 (×2): qty 2

## 2015-07-17 MED ORDER — HYDROCHLOROTHIAZIDE 25 MG PO TABS
25.0000 mg | ORAL_TABLET | Freq: Every day | ORAL | Status: DC
  Administered 2015-07-17: 25 mg via ORAL
  Filled 2015-07-17: qty 1

## 2015-07-17 MED ORDER — INSULIN DETEMIR 100 UNIT/ML ~~LOC~~ SOLN
15.0000 [IU] | Freq: Every day | SUBCUTANEOUS | Status: DC
  Administered 2015-07-17 – 2015-07-18 (×2): 15 [IU] via SUBCUTANEOUS
  Filled 2015-07-17 (×3): qty 0.15

## 2015-07-17 MED ORDER — INSULIN DETEMIR 100 UNIT/ML ~~LOC~~ SOLN: 15.0000 [IU] | Freq: Every day | SUBCUTANEOUS | Status: DC

## 2015-07-17 NOTE — Progress Notes (Signed)
Subjective:  Feels better but still requiring IV pain meds Objective Vital signs in last 24 hours: Filed Vitals:   07/16/15 0601 07/16/15 1411 07/16/15 2200 07/17/15 0624  BP: 159/89 148/88 144/84 149/91  Pulse: 88 84 80 89  Temp: 99 F (37.2 C) 98.9 F (37.2 C) 99 F (37.2 C) 99.5 F (37.5 C)  TempSrc: Oral  Oral Oral  Resp: 14 14 18 16   Height:      Weight:    107.004 kg (235 lb 14.4 oz)  SpO2: 97% 96% 95% 91%   Weight change: -0.953 kg (-2 lb 1.6 oz)  Intake/Output Summary (Last 24 hours) at 07/17/15 1056 Last data filed at 07/17/15 0900  Gross per 24 hour  Intake    960 ml  Output    300 ml  Net    660 ml    Assessment/Plan: 54 year old BM with HTN, DM on losartan and invokana with normal renal function in March. He presents with a unilateral renal hemorrhage associated with AKI 1.Renal- AKI in the setting of above. The change in renal function is most likely due to the hemorrhage and some acute glomerulus dysfunction although seems out of proportion because I have seen other patients not have any change in GFR in this setting. Could medications such as the losartan/invokana and NSAIDS have contributed ?? In any event, all those meds are on hold and renal function is improved today.  I anticipate renal function to continue to improve- from renal standpoint this could now be managed as an OP 2. Hypertension/volume - blood pressure if anything is high- stopped IVF- will resume HCTZ but cont to hold losartan til renal function back to baseline 3. Renal hemorrhage- usually these are self limiting. Most concern would be a renal tumor underlying the hemorrhage and that is why follow up imaging and with urology is important. Pain control without NSAIDS 4. Anemia - actually seems to have polycythemia at baseline- at least since 2015- can continue to be managed by his PCP 5. DM- can resume metformin at this time 6. Dispo- Renal will sign off- continue to hold ARB and I will see as  OP- OK for discharge from my standpoint but pain control seems to be the limiting issue   Lavance [name] A    Labs: Basic Metabolic Panel:  Recent Labs Lab 07/15/15 2318 07/16/15 0509 07/17/15 0604  NA 135 136 134*  K 4.1 4.6 4.3  CL 98* 101 99*  CO2 25 25 25   GLUCOSE 336* 270* 199*  BUN 29* 30* 20  CREATININE 2.26* 2.30* 1.71*  CALCIUM 10.1 9.2 9.2  PHOS  --   --  2.9   Liver Function Tests:  Recent Labs Lab 07/16/15 0509 07/17/15 0604  AST 23  --   ALT 24  --   ALKPHOS 57  --   BILITOT 0.7  --   PROT 6.6  --   ALBUMIN 3.6 3.7   No results for input(s): LIPASE, AMYLASE in the last 168 hours. No results for input(s): AMMONIA in the last 168 hours. CBC:  Recent Labs Lab 07/15/15 2318 07/16/15 0509  WBC 10.7* 10.8*  NEUTROABS 7.1  --   HGB 19.6* 17.7*  HCT 57.8* 52.4*  MCV 85.5 85.8  PLT 267 254   Cardiac Enzymes: No results for input(s): CKTOTAL, CKMB, CKMBINDEX, TROPONINI in the last 168 hours. CBG:  Recent Labs Lab 07/16/15 0643 07/16/15 1123 07/16/15 1612 07/16/15 2203 07/17/15 0626  GLUCAP 267* 244* 199* 183*  233*    Iron Studies: No results for input(s): IRON, TIBC, TRANSFERRIN, FERRITIN in the last 72 hours. Studies/Results: Ct Renal Stone Study  07/16/2015   CLINICAL DATA:  Acute onset of right flank pain.  Initial encounter.  EXAM: CT ABDOMEN AND PELVIS WITHOUT CONTRAST  TECHNIQUE: Multidetector CT imaging of the abdomen and pelvis was performed following the standard protocol without IV contrast.  COMPARISON:  CT of the abdomen and pelvis from 03/16/2010, and abdominal ultrasound performed 03/09/2010  FINDINGS: The visualized lung bases are clear.  The liver and spleen are unremarkable in appearance. The gallbladder is within normal limits. The pancreas and adrenal glands are unremarkable.  There is a prominent perinephric hematoma about the right kidney, measuring up to 3.5 cm in thickness. The etiology of this hemorrhage is uncertain,  though there is a focus of slightly increased attenuation at the upper pole of the right kidney.  The left kidney is unremarkable in appearance. No renal or ureteral stones are seen. There is no evidence of hydronephrosis.  No free fluid is identified. The small bowel is unremarkable in appearance. The stomach is within normal limits. No acute vascular abnormalities are seen. Mild scattered calcification is noted along the abdominal aorta and its branches.  The appendix is normal in caliber and contains air, without evidence of appendicitis. The colon is unremarkable in appearance.  The bladder is mildly distended and grossly unremarkable. The prostate remains borderline normal in size. No inguinal lymphadenopathy is seen.  No acute osseous abnormalities are identified.  IMPRESSION: 1. Prominent right-sided perinephric hematoma noted, measuring up to 3.5 cm in thickness. The etiology of this hemorrhage is uncertain, though a focus of slightly increased attenuation is noted at the upper pole of the right kidney. This may correspond to the patient's acute renal failure. 2. Mild scattered calcification along the abdominal aorta and its branches.   Electronically Signed   By: Garald Balding M.D.   On: 07/16/2015 00:58   Medications: Infusions:    Scheduled Medications: . atorvastatin  20 mg Oral Daily  . bisoprolol  10 mg Oral Daily  . ezetimibe  10 mg Oral Daily  . insulin aspart  0-15 Units Subcutaneous TID WC  . insulin aspart  0-5 Units Subcutaneous QHS  . insulin detemir  15 Units Subcutaneous Daily  . sodium chloride  1,000 mL Intravenous Once  . sodium chloride  3 mL Intravenous Q12H    have reviewed scheduled and prn medications.  Physical Exam: General: NAD Heart: RRR Lungs: mostly clear Abdomen: soft, non tender Extremities: trace edema    07/17/2015,10:56 AM  LOS: 1 day

## 2015-07-17 NOTE — Progress Notes (Signed)
MD, patient is requesting an order for a laxative, he normally has a BM everyday.  Last BM on 07/15/15.

## 2015-07-17 NOTE — Progress Notes (Signed)
Patient's urine has been slightly blood tinged. Patient is voiding without pain or discomfort.  Right flank pain still happening.  Dilaudid iv 1 mg helps with the pain every 4 hours.

## 2015-07-17 NOTE — Progress Notes (Signed)
[facility] notified that pt had a 4 beat run of non sustained V-tach at 1844 07/17/2015. New orders received will continue to monitor. Arthor Captain LPN

## 2015-07-17 NOTE — Progress Notes (Signed)
[facility] PROGRESS NOTE  Karter Hellmer II WTU:[id number] DOB: [birth year] DOA: 07/15/2015 PCP: Alesia Richards, MD  Assessment/Plan: 1. Renal hemorrhage- ? cause, called and discussed with urologist on call. He recommends to follow up with [facility] in 2 weeks. 2. Acute kidney injury- patient presented with a creatinine of 2.26, now down to 1.71. Nephrology is following. They will follow-up the patient in the clinic in 2 weeks. 3. Right flank pain- due to above. Will switch IV Dilaudid to by mouth oxycodone 10 mg every 6 hours when necessary 4. Diabetes mellitus- continue Levemir, sliding-scale insulin, metformin restarted. 5. Hypertension- continue Zometa, HCTZ 6. Hyperlipidemia- continue Lipitor 7. DVT prophylaxis- SCDs    Code Status: Full code Family Communication: *We discussed the patient's wife at bedside Disposition Plan: *Home when pain-controlled   Consultants:  Nephrology  Procedures:  *None  Antibiotics:  None  HPI/Subjective: 54 year old male with history of diabetes, hypertension, hyperlipidemia came to the hospital with right flank pain. Imaging showed renal hemorrhage. Also patient found to have acute kidney injury, with creatinine 2.30.  This morning patient's renal functions are improving, but continues to have flank pain. Patient has been getting IV Dilaudid every 4 hours when necessary.   Objective: Filed Vitals:   07/17/15 1314  BP: 146/86  Pulse: 86  Temp: 99 F (37.2 C)  Resp: 16    Intake/Output Summary (Last 24 hours) at 07/17/15 1433 Last data filed at 07/17/15 1313  Gross per 24 hour  Intake    960 ml  Output    300 ml  Net    660 ml   Filed Weights   07/15/15 2221 07/16/15 0500 07/17/15 0624  Weight: 107.956 kg (238 lb) 104.327 kg (230 lb) 107.004 kg (235 lb 14.4 oz)    Exam:   General:  Appears in no acute distress  Cardiovascular: S1-S2 regular  Respiratory: Clear to auscultation bilaterally  Abdomen:  Soft, nontender, no organomegaly  Musculoskeletal: No cyanosis ,no clubbing or edema.  Data Reviewed: Basic Metabolic Panel:  Recent Labs Lab 07/15/15 2318 07/16/15 0509 07/17/15 0604  NA 135 136 134*  K 4.1 4.6 4.3  CL 98* 101 99*  CO2 25 25 25   GLUCOSE 336* 270* 199*  BUN 29* 30* 20  CREATININE 2.26* 2.30* 1.71*  CALCIUM 10.1 9.2 9.2  PHOS  --   --  2.9   Liver Function Tests:  Recent Labs Lab 07/16/15 0509 07/17/15 0604  AST 23  --   ALT 24  --   ALKPHOS 57  --   BILITOT 0.7  --   PROT 6.6  --   ALBUMIN 3.6 3.7   No results for input(s): LIPASE, AMYLASE in the last 168 hours. No results for input(s): AMMONIA in the last 168 hours. CBC:  Recent Labs Lab 07/15/15 2318 07/16/15 0509  WBC 10.7* 10.8*  NEUTROABS 7.1  --   HGB 19.6* 17.7*  HCT 57.8* 52.4*  MCV 85.5 85.8  PLT 267 254   Cardiac Enzymes: No results for input(s): CKTOTAL, CKMB, CKMBINDEX, TROPONINI in the last 168 hours. BNP (last 3 results) No results for input(s): BNP in the last 8760 hours.  ProBNP (last 3 results) No results for input(s): PROBNP in the last 8760 hours.  CBG:  Recent Labs Lab 07/16/15 1123 07/16/15 1612 07/16/15 2203 07/17/15 0626 07/17/15 1133  GLUCAP 244* 199* 183* 233* 223*    No results found for this or any previous visit (from the past 240 hour(s)).   Studies:  Ct Renal Stone Study  07/16/2015   CLINICAL DATA:  Acute onset of right flank pain.  Initial encounter.  EXAM: CT ABDOMEN AND PELVIS WITHOUT CONTRAST  TECHNIQUE: Multidetector CT imaging of the abdomen and pelvis was performed following the standard protocol without IV contrast.  COMPARISON:  CT of the abdomen and pelvis from 03/16/2010, and abdominal ultrasound performed 03/09/2010  FINDINGS: The visualized lung bases are clear.  The liver and spleen are unremarkable in appearance. The gallbladder is within normal limits. The pancreas and adrenal glands are unremarkable.  There is a prominent  perinephric hematoma about the right kidney, measuring up to 3.5 cm in thickness. The etiology of this hemorrhage is uncertain, though there is a focus of slightly increased attenuation at the upper pole of the right kidney.  The left kidney is unremarkable in appearance. No renal or ureteral stones are seen. There is no evidence of hydronephrosis.  No free fluid is identified. The small bowel is unremarkable in appearance. The stomach is within normal limits. No acute vascular abnormalities are seen. Mild scattered calcification is noted along the abdominal aorta and its branches.  The appendix is normal in caliber and contains air, without evidence of appendicitis. The colon is unremarkable in appearance.  The bladder is mildly distended and grossly unremarkable. The prostate remains borderline normal in size. No inguinal lymphadenopathy is seen.  No acute osseous abnormalities are identified.  IMPRESSION: 1. Prominent right-sided perinephric hematoma noted, measuring up to 3.5 cm in thickness. The etiology of this hemorrhage is uncertain, though a focus of slightly increased attenuation is noted at the upper pole of the right kidney. This may correspond to the patient's acute renal failure. 2. Mild scattered calcification along the abdominal aorta and its branches.   Electronically Signed   By: Garald Balding M.D.   On: 07/16/2015 00:58    Scheduled Meds: . atorvastatin  20 mg Oral Daily  . bisoprolol  10 mg Oral Daily  . ezetimibe  10 mg Oral Daily  . hydrochlorothiazide  25 mg Oral Daily  . insulin aspart  0-15 Units Subcutaneous TID WC  . insulin aspart  0-5 Units Subcutaneous QHS  . insulin detemir  15 Units Subcutaneous Daily  . metFORMIN  1,000 mg Oral BID WC  . sodium chloride  1,000 mL Intravenous Once  . sodium chloride  3 mL Intravenous Q12H   Continuous Infusions:   Principal Problem:   AKI (acute kidney injury) Active Problems:   Hypertension   Perinephric hematoma   DM (diabetes  mellitus), type 2    Time spent: *25 min    [facility]  [facility] Pager [phone]*. If 7PM-7AM, please contact night-coverage at [email or web address], password [facility] 07/17/2015, 2:33 PM  LOS: 1 day

## 2015-07-18 DIAGNOSIS — I10 Essential (primary) hypertension: Secondary | ICD-10-CM

## 2015-07-18 DIAGNOSIS — N179 Acute kidney failure, unspecified: Principal | ICD-10-CM

## 2015-07-18 DIAGNOSIS — E119 Type 2 diabetes mellitus without complications: Secondary | ICD-10-CM

## 2015-07-18 DIAGNOSIS — N2889 Other specified disorders of kidney and ureter: Secondary | ICD-10-CM

## 2015-07-18 LAB — GLUCOSE, CAPILLARY
Glucose-Capillary: 173 mg/dL — ABNORMAL HIGH (ref 65–99)
Glucose-Capillary: 164 mg/dL — ABNORMAL HIGH (ref 65–99)
Glucose-Capillary: 139 mg/dL — ABNORMAL HIGH (ref 65–99)

## 2015-07-18 LAB — RENAL FUNCTION PANEL
Albumin: 3.2 g/dL — ABNORMAL LOW (ref 3.5–5.0)
Anion gap: 10 (ref 5–15)
BUN: 18 mg/dL (ref 6–20)
CO2: 29 mmol/L (ref 22–32)
Calcium: 9.1 mg/dL (ref 8.9–10.3)
Chloride: 93 mmol/L — ABNORMAL LOW (ref 101–111)
Creatinine, Ser: 1.74 mg/dL — ABNORMAL HIGH (ref 0.61–1.24)
GFR calc Af Amer: 50 mL/min — ABNORMAL LOW (ref >60)
GFR calc non Af Amer: 43 mL/min — ABNORMAL LOW (ref >60)
Glucose, Bld: 155 mg/dL — ABNORMAL HIGH (ref 65–99)
Phosphorus: 2.9 mg/dL (ref 2.5–4.6)
Potassium: 4.1 mmol/L (ref 3.5–5.1)
Sodium: 132 mmol/L — ABNORMAL LOW (ref 135–145)

## 2015-07-18 LAB — MAGNESIUM: Magnesium: 2 mg/dL (ref 1.7–2.4)

## 2015-07-18 MED ORDER — AMLODIPINE BESYLATE 5 MG PO TABS
5.0000 mg | ORAL_TABLET | Freq: Every day | ORAL | Status: DC
  Administered 2015-07-18: 5 mg via ORAL
  Filled 2015-07-18: qty 1

## 2015-07-18 MED ORDER — BISOPROLOL FUMARATE 10 MG PO TABS: 10.0000 mg | ORAL_TABLET | Freq: Every day | ORAL | Status: DC

## 2015-07-18 MED ORDER — OXYCODONE HCL 10 MG PO TABS: 10.0000 mg | ORAL_TABLET | ORAL | Status: DC | PRN

## 2015-07-18 MED ORDER — AMLODIPINE BESYLATE 10 MG PO TABS: 10.0000 mg | ORAL_TABLET | Freq: Every day | ORAL | Status: DC

## 2015-07-18 NOTE — Discharge Summary (Signed)
Physician Discharge Summary  Russell [name] VZD:[id number] DOB: [birth year] DOA: 07/15/2015  PCP: Alesia Richards, MD  Admit date: 07/15/2015 Discharge date: 07/18/2015  Time spent: 70 minutes  Recommendations for Outpatient Follow-up:  1. Follow-up with Alesia Richards, MD as scheduled on 07/29/2015. On follow-up patient will need a basic metabolic profile done to follow-up on electrolytes and renal function. Patient will need a CBC done to follow-up on H&H. Patient's blood pressure need to be reassessed as his blood pressure regimen was changed to bisoprolol and Norvasc secondary to acute renal failure. Patient's diabetes will need to be reassessed as patient is able, and metformin were discontinued secondary to acute renal failure. 2. Follow-up with Dr. Jeffie Pollock of urology in 2 weeks for further evaluation of patient's renal hemorrhage. Patient may need further imaging. 3. Follow-up with Dr. Moshe Cipro nephrology in 2 weeks.  Discharge Diagnoses:  Principal Problem:   Acute renal failure syndrome Active Problems:   Perinephric hematoma   Hypertension   Hyperlipidemia   AKI (acute kidney injury)   DM (diabetes mellitus), type 2   Discharge Condition: Stable and improved.  Diet recommendation: Carb modified diet.  Filed Weights   07/15/15 2221 07/16/15 0500 07/17/15 0624  Weight: 107.956 kg (238 lb) 104.327 kg (230 lb) 107.004 kg (235 lb 14.4 oz)    History of present illness:  Per Dr. Caralee Ates [name] is a 54 y.o. male with Past medical history of diabetes mellitus type 2, hypertension, hypogonadism, dyslipidemia. The patient presented with complaints of right-sided flank pain that started on the day of admission, while he was eating. The pain was sharp and stabbing and the did not resolve and therefore he decided to come to the hospital. He denied any fever or chills denied any burning urination denied any active bleeding denied any fall trauma or injury denied  any cough denied any chest pain denied any abdominal pain denied any nausea or vomiting no diarrhea or constipation. He denied any recent change in his medications.  The patient is coming from home.  At his baseline ambulates without any support And is independent for most of his ADL manages her medication on his own.    Hospital Course:  #1 renal hemorrhage Patient on admission, call with complaints of right-sided flank pain which started on the day of admission. Urinalysis which was done at 3-6 RBCs. CT stone protocol was done which showed a prominent right-sided perinephric hematoma measuring up to 3.5 cm in thickness. Patient was admitted to telemetry. Patient was monitored. Urology was consulted over the telephone and recommended conservative management with outpatient follow-up. Patient was also noted to be in acute renal failure and a such nephrotoxic agents were held including aspirin and patient was hydrated with IV fluids. Patient's hemoglobin was followed which remained stable. Patient be discharged home in stable condition and is to follow-up with his urologist, Dr. Jeffie Pollock as outpatient in 2 weeks. Patient may need further evaluation to rule out a renal tumor and may need follow-up imaging.  #2 acute renal failure Patient was admitted and noted to have a renal hemorrhage. Patient was also noted to be in acute renal failure. Patient's creatinine was 2.26 from 0.97 on 02/24/2015. It was felt this may be secondary to hemorrhage and some acute glomerulus dysfunction in the setting of medication status losartan/invokana, diuretics and NSAIDS. Nephrotoxic agents were held and renal consultation was obtained. Patient was seen in consultation by Dr. Moshe Cipro. Patient was followed. Patient's renal function improved and trended down  such that by day of discharge patient's creatinine was 1.74. It was felt that patient's renal function will continue to improve and patient will follow-up with  nephrology as outpatient. Patient was discharged in stable and improved condition.  #3 hypertension Some of patient's anti-hypertensive medications were held secondary to problem #2. Patient was maintained on his bisoprolol. Norvasc was added to patient's regimen for better blood pressure control. Patient's losartan, diuretics were held secondary to his acute renal failure and not resumed on discharge. Outpatient follow-up.  #4 anemia Patient does have a history of polycythemia at baseline. Outpatient follow-up.  #5 diabetes mellitus Patient was maintained on insulin during the hospitalization. As patient's renal function improve Glucophage was resumed. However Glucophage was discontinued on discharge as was noted that patient's creatinine was 1.74. Patient be discharged on Lantus as outpatient will need to follow-up with PCP as outpatient.  #6 hyperlipidemia Patient was maintained on his statin.   Procedures:  CT renal stone protocol 07/16/2015  Consultations:  Nephrology: Dr. Moshe Cipro 07/16/2015  Discharge Exam: Filed Vitals:   07/18/15 1500  BP: 158/98  Pulse: 94  Temp: 97.9 F (36.6 C)  Resp: 16    General: NAD Cardiovascular: RRR Respiratory: CTAB  Discharge Instructions   Discharge Instructions    Diet Carb Modified    Complete by:  As directed      Discharge instructions    Complete by:  As directed   Follow up with Alesia Richards, MD as scheduled. Follow up with Dr Moshe Cipro, nephrology in 2 weeks. Follow up with urology, Dr Jeffie Pollock in 2 weeks.     Increase activity slowly    Complete by:  As directed           Current Discharge Medication List    START taking these medications   Details  amLODipine (NORVASC) 10 MG tablet Take 1 tablet (10 mg total) by mouth daily. Qty: 30 tablet, Refills: 0    bisoprolol (ZEBETA) 10 MG tablet Take 1 tablet (10 mg total) by mouth daily. Qty: 30 tablet, Refills: 0    oxyCODONE 10 MG TABS Take 1 tablet  (10 mg total) by mouth every 4 (four) hours as needed for moderate pain or severe pain. Qty: 20 tablet, Refills: 0      CONTINUE these medications which have NOT CHANGED   Details  atorvastatin (LIPITOR) 40 MG tablet Take 0.5 tablets (20 mg total) by mouth daily. Qty: 90 tablet, Refills: prn    ciclesonide (OMNARIS) 50 MCG/ACT nasal spray Place 2 sprays into both nostrils daily as needed for allergies.     clotrimazole-betamethasone (LOTRISONE) cream Apply 3 times a day as needed Qty: 15 g, Refills: 11    ezetimibe (ZETIA) 10 MG tablet Take 1 tablet (10 mg total) by mouth daily. for cholesterol Qty: 30 tablet, Refills: 3    fluconazole (DIFLUCAN) 150 MG tablet Take 1 tab by mouth once a week Qty: 4 tablet, Refills: 11    Insulin Pen Needle 31G X 5 MM MISC 1 qd Qty: 100 each, Refills: 6    Insulin Syringe-Needle U-100 31G X 15/64" 0.3 ML MISC Use as directed Qty: 100 each, Refills: 3    LEVEMIR FLEXTOUCH 100 UNIT/ML Pen START WITH 10 UNITS AND TITRATE AS DIRECTED Qty: 15 mL, Refills: 6    tadalafil (CIALIS) 20 MG tablet Take 1/2 to 1 tablet every 2-3 days as needed Qty: 4 tablet, Refills: 1    testosterone cypionate (DEPOTESTOTERONE CYPIONATE) 200 MG/ML injection Inject 2 mLs (  400 mg total) into the muscle every 14 (fourteen) days. Qty: 10 mL, Refills: 1    Vitamin D, Ergocalciferol, (DRISDOL) 50000 UNITS CAPS capsule TAKE ONE CAPSULE EVERY DAY OR AS DIRECTED Qty: 30 capsule, Refills: 3      STOP taking these medications     aspirin 81 MG tablet      bisoprolol-hydrochlorothiazide (ZIAC) 10-6.25 MG per tablet      hydrochlorothiazide (HYDRODIURIL) 25 MG tablet      INVOKANA 300 MG TABS tablet      losartan (COZAAR) 100 MG tablet      metFORMIN (GLUCOPHAGE) 500 MG tablet        Allergies  Allergen Reactions  . Ppd [Tuberculin Purified Protein Derivative] Other (See Comments)    POSITIVE REACTION IN PAST 11/21/11 CXR NEG   Follow-up Information    Follow up  with GOLDSBOROUGH,KELLIE A, MD In 2 weeks.   Specialty:  Nephrology   Contact information:   [place] [address] [phone]       Follow up with Malka So, MD. Schedule an appointment as soon as possible for a visit in 2 weeks.   Specialty:  Urology   Contact information:   [place] [address] [phone]       Follow up with Alesia Richards, MD. Schedule an appointment as soon as possible for a visit on 07/29/2015.   Specialty:  Internal Medicine   Contact information:   [address] [place]  [address] [phone]        The results of significant diagnostics from this hospitalization (including imaging, microbiology, ancillary and laboratory) are listed below for reference.    Significant Diagnostic Studies: Ct Renal Stone Study  07/16/2015   CLINICAL DATA:  Acute onset of right flank pain.  Initial encounter.  EXAM: CT ABDOMEN AND PELVIS WITHOUT CONTRAST  TECHNIQUE: Multidetector CT imaging of the abdomen and pelvis was performed following the standard protocol without IV contrast.  COMPARISON:  CT of the abdomen and pelvis from 03/16/2010, and abdominal ultrasound performed 03/09/2010  FINDINGS: The visualized lung bases are clear.  The liver and spleen are unremarkable in appearance. The gallbladder is within normal limits. The pancreas and adrenal glands are unremarkable.  There is a prominent perinephric hematoma about the right kidney, measuring up to 3.5 cm in thickness. The etiology of this hemorrhage is uncertain, though there is a focus of slightly increased attenuation at the upper pole of the right kidney.  The left kidney is unremarkable in appearance. No renal or ureteral stones are seen. There is no evidence of hydronephrosis.  No free fluid is identified. The small bowel is unremarkable in appearance. The stomach is within normal limits. No acute vascular abnormalities are seen. Mild scattered calcification is  noted along the abdominal aorta and its branches.  The appendix is normal in caliber and contains air, without evidence of appendicitis. The colon is unremarkable in appearance.  The bladder is mildly distended and grossly unremarkable. The prostate remains borderline normal in size. No inguinal lymphadenopathy is seen.  No acute osseous abnormalities are identified.  IMPRESSION: 1. Prominent right-sided perinephric hematoma noted, measuring up to 3.5 cm in thickness. The etiology of this hemorrhage is uncertain, though a focus of slightly increased attenuation is noted at the upper pole of the right kidney. This may correspond to the patient's acute renal failure. 2. Mild scattered calcification along the abdominal aorta and its branches.   Electronically Signed  By: Garald Balding M.D.   On: 07/16/2015 00:58    Microbiology: No results found for this or any previous visit (from the past 240 hour(s)).   Labs: Basic Metabolic Panel:  Recent Labs Lab 07/15/15 2318 07/16/15 0509 07/17/15 0604 07/17/15 2340 07/18/15 0602  NA 135 136 134*  --  132*  K 4.1 4.6 4.3  --  4.1  CL 98* 101 99*  --  93*  CO2 25 25 25   --  29  GLUCOSE 336* 270* 199*  --  155*  BUN 29* 30* 20  --  18  CREATININE 2.26* 2.30* 1.71*  --  1.74*  CALCIUM 10.1 9.2 9.2  --  9.1  MG  --   --   --  2.0  --   PHOS  --   --  2.9  --  2.9   Liver Function Tests:  Recent Labs Lab 07/16/15 0509 07/17/15 0604 07/18/15 0602  AST 23  --   --   ALT 24  --   --   ALKPHOS 57  --   --   BILITOT 0.7  --   --   PROT 6.6  --   --   ALBUMIN 3.6 3.7 3.2*   No results for input(s): LIPASE, AMYLASE in the last 168 hours. No results for input(s): AMMONIA in the last 168 hours. CBC:  Recent Labs Lab 07/15/15 2318 07/16/15 0509  WBC 10.7* 10.8*  NEUTROABS 7.1  --   HGB 19.6* 17.7*  HCT 57.8* 52.4*  MCV 85.5 85.8  PLT 267 254   Cardiac Enzymes: No results for input(s): CKTOTAL, CKMB, CKMBINDEX, TROPONINI in the last 168  hours. BNP: BNP (last 3 results) No results for input(s): BNP in the last 8760 hours.  ProBNP (last 3 results) No results for input(s): PROBNP in the last 8760 hours.  CBG:  Recent Labs Lab 07/17/15 1528 07/17/15 2157 07/18/15 0647 07/18/15 1318 07/18/15 1611  GLUCAP 186* 173* 173* 164* 139*       Signed:  Monroe Qin MD [facility] 07/18/2015, 5:25 PM

## 2015-07-18 NOTE — Progress Notes (Signed)
D/C instructions complete - given copy of d/c print out - reviewed instructions and Rx's. Questions answered. Gave copy of his Rx's for BP management and pain management. Will be driven home by a family member. All personal belongings taken home by his wife earlier. Reviewed the appointments he needs to schedule. Tech previously has removed Saline Lock IV access. Will be taken by Nursing Tech to [facility] tower exit via w/c for discharge.

## 2015-07-20 ENCOUNTER — Ambulatory Visit (INDEPENDENT_AMBULATORY_CARE_PROVIDER_SITE_OTHER): Payer: BLUE CROSS/BLUE SHIELD | Admitting: Internal Medicine

## 2015-07-20 ENCOUNTER — Other Ambulatory Visit: Payer: Self-pay | Admitting: *Deleted

## 2015-07-20 ENCOUNTER — Encounter: Payer: Self-pay | Admitting: Internal Medicine

## 2015-07-20 VITALS — BP 142/84 | HR 88 | Temp 97.3°F | Resp 16 | Ht 69.0 in | Wt 244.0 lb

## 2015-07-20 DIAGNOSIS — S37011D Minor contusion of right kidney, subsequent encounter: Secondary | ICD-10-CM

## 2015-07-20 DIAGNOSIS — Z1212 Encounter for screening for malignant neoplasm of rectum: Secondary | ICD-10-CM

## 2015-07-20 DIAGNOSIS — I10 Essential (primary) hypertension: Secondary | ICD-10-CM

## 2015-07-20 DIAGNOSIS — E291 Testicular hypofunction: Secondary | ICD-10-CM

## 2015-07-20 DIAGNOSIS — R5383 Other fatigue: Secondary | ICD-10-CM

## 2015-07-20 DIAGNOSIS — E1329 Other specified diabetes mellitus with other diabetic kidney complication: Secondary | ICD-10-CM | POA: Diagnosis not present

## 2015-07-20 DIAGNOSIS — IMO0002 Reserved for concepts with insufficient information to code with codable children: Secondary | ICD-10-CM

## 2015-07-20 DIAGNOSIS — N182 Chronic kidney disease, stage 2 (mild): Secondary | ICD-10-CM

## 2015-07-20 DIAGNOSIS — Z6836 Body mass index (BMI) 36.0-36.9, adult: Secondary | ICD-10-CM | POA: Diagnosis not present

## 2015-07-20 DIAGNOSIS — E349 Endocrine disorder, unspecified: Secondary | ICD-10-CM | POA: Insufficient documentation

## 2015-07-20 DIAGNOSIS — Z91199 Patient's noncompliance with other medical treatment and regimen due to unspecified reason: Secondary | ICD-10-CM | POA: Insufficient documentation

## 2015-07-20 DIAGNOSIS — Z79899 Other long term (current) drug therapy: Secondary | ICD-10-CM

## 2015-07-20 DIAGNOSIS — N183 Chronic kidney disease, stage 3 unspecified: Secondary | ICD-10-CM

## 2015-07-20 DIAGNOSIS — E1365 Other specified diabetes mellitus with hyperglycemia: Secondary | ICD-10-CM

## 2015-07-20 DIAGNOSIS — E559 Vitamin D deficiency, unspecified: Secondary | ICD-10-CM

## 2015-07-20 DIAGNOSIS — Z125 Encounter for screening for malignant neoplasm of prostate: Secondary | ICD-10-CM | POA: Diagnosis not present

## 2015-07-20 DIAGNOSIS — N179 Acute kidney failure, unspecified: Secondary | ICD-10-CM

## 2015-07-20 DIAGNOSIS — E785 Hyperlipidemia, unspecified: Secondary | ICD-10-CM

## 2015-07-20 DIAGNOSIS — Z9119 Patient's noncompliance with other medical treatment and regimen: Secondary | ICD-10-CM

## 2015-07-20 DIAGNOSIS — E1322 Other specified diabetes mellitus with diabetic chronic kidney disease: Secondary | ICD-10-CM

## 2015-07-20 MED ORDER — "INSULIN SYRINGE-NEEDLE U-100 31G X 15/64"" 0.3 ML MISC": Status: DC

## 2015-07-20 NOTE — Progress Notes (Signed)
Patient ID: Russell [name], male   DOB: [birth year], 54 y.o.   MRN: [id number]    Comprehensive Examination     This very nice 54 y.o. MBM presents for complete physical.  Patient has been followed for HTN, T2_NIDDM  , Hyperlipidemia, Testosterone and [name].      Patient was recently hospitalized Aug 5-8  with right back & Rt abd  pain and was found to have a right perinephric hematoma. He also was found to have acute on chronic renal failure and he was rehydrated and meds for BP were adjusted ith stopping of his diuretics(Hctz), ARB (Losartin) and Metformin and creatinine was beginning to show recovery by discharge. CTscan of Abd did show a Right peri-nephric attributing to his presentation with pain which incidentally did require a moderate amount pain meds to control his pain    HTN predates since 2000. Patient's BP has been controlled at home.Today's BP: (!) 142/84 mmHg. Patient denies any cardiac symptoms as chest pain, palpitations, shortness of breath, dizziness or ankle swelling.    Patient's hyperlipidemia is controlled with diet and medications. Patient denies myalgias or other medication SE's. Last lipids were  Cholesterol 175; HDL 37*; LDL 111; Triglycerides 137 on  02/24/2015    Patient has T2_NIDDM since 2005 with CKD 2 (GFR 79)  At 1 year ago and patient denies reactive hypoglycemic symptoms, visual blurring, diabetic polys or paresthesias. Last A1c was  Not at goal - 9.9% on 02/24/2015 commensurate with his poor diary compliance.      Finally, patient has history of [name] of "28" in 2008  and last vitamin D was 59 on 02/24/2015.     Medication Sig  . amLODipine (N 10 MG tablet Take 1 tabl daily.  Marland Kitchen atorvastatin  40 MG tablet Take 0.5 tab  daily.  . bisoprolol  10 MG tablet Take 1 tab daily.  Marland Kitchen OMNARISnasal spray Place 2 sprays into both nostrils daily as needed for allergies.   . clotrimazole-betamethasone (LOTRISONE) cream  Apply 3 times a day as  needed)  . ezetimibe (ZETIA) 10 MG tablet Take 1 tab daily for cholesterol  . fluconazole 150 MG tablet Take 1 tab  once a week as needed for yeast infections)  . LEVEMIR FLEXTOUCH 100 UNIT/ML Pen  taking 15 units every morning  . oxyCODONE 10 MG TABS Take 1 tablet (10 mg total) by mouth every 4 (four) hours as needed for moderate pain or severe pain.  . tadalafil (CIALIS) 20 MG tablet  Take 1/2 to 1 tablet every 2-3 days as needed)  . DEPO-TESTOTERONE 200 MG/ML inj  Inject 2 mLs (400 mg total) into the muscle every 14 (fourteen) days.  . Vitamin D 50,000 UNITS   Take 50,000 Units by mouth 2 (two) times a week. Sunday and thursdays)   Allergies  Allergen Reactions  . 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 Hypertension   . Hyperlipidemia   . Diabetes mellitus   . Hypogonadism male   . Chronic sinusitis   . Allergy   . Allergic rhinitis    Health Maintenance  Topic Date Due  . FOOT EXAM  09/08/1972  . URINE MICROALBUMIN  01/06/2015  . OPHTHALMOLOGY EXAM  06/18/2015  . INFLUENZA VACCINE  07/11/2015  . HEMOGLOBIN A1C  08/27/2015  . COLONOSCOPY  05/10/2016  . PNEUMOCOCCAL POLYSACCHARIDE VACCINE (2)  12/30/2016  . TETANUS/TDAP  12/20/2019  . Hepatitis C Screening  Completed  . HIV Screening  Completed   Immunization History  Administered Date(s) Administered  . Influenza Split 10/10/2013  . Influenza Whole 09/08/2012  . Pneumococcal-Unspecified 12/31/2011  . Tdap 12/19/2009   Past Surgical History  Procedure Laterality Date  . Wisdom tooth extraction    . Hernia repair     Family History  Problem Relation Age of Onset  . Diabetes Mother   . Hypertension Mother   . Hyperlipidemia Mother   . Diabetes Father   . Hypertension Father   . Hyperlipidemia Father   . Diabetes Brother   . Hypertension Brother    Social History   Social History  . Marital  Status: Divorced    Spouse Name: N/A  . Number of Children: N/A  . Years of Education: N/A   Occupational History  . Not on file.   Social History Main Topics  . Smoking status: Former Smoker -- 0.30 packs/day for 8 years    Types: Cigarettes    Quit date: 12/10/2000  . Smokeless tobacco: Not on file  . Alcohol Use: Yes     Comment: Special Occassions  . Drug Use: No  . Sexual Activity: Active      ROS Constitutional: Denies fever, chills, weight loss/gain, headaches, insomnia,  night sweats or change in appetite. Does c/o fatigue. Eyes: Denies redness, blurred vision, diplopia, discharge, itchy or watery eyes.  ENT: Denies discharge, congestion, post nasal drip, epistaxis, sore throat, earache, hearing loss, dental pain, Tinnitus, Vertigo, Sinus pain or snoring.  Cardio: Denies chest pain, palpitations, irregular heartbeat, syncope, dyspnea, diaphoresis, orthopnea, PND, claudication or edema Respiratory: denies cough, dyspnea, DOE, pleurisy, hoarseness, laryngitis or wheezing.  Gastrointestinal: Denies dysphagia, heartburn, reflux, water brash, pain, cramps, nausea, vomiting, bloating, diarrhea, constipation, hematemesis, melena, hematochezia, jaundice or hemorrhoids Genitourinary: Denies dysuria, frequency, urgency, nocturia, hesitancy, discharge, hematuria or flank pain Musculoskeletal: Denies arthralgia, myalgia, stiffness, Jt. Swelling, pain, limp or strain/sprain. Denies Falls. Skin: Denies puritis, rash, hives, warts, acne, eczema or change in skin lesion Neuro: No weakness, tremor, incoordination, spasms, paresthesia or pain Psychiatric: Denies confusion, memory loss or sensory loss. Denies Depression. Endocrine: Denies change in weight, skin, hair change, nocturia, and paresthesia, diabetic polys, visual blurring or hyper / hypo glycemic episodes.  Heme/Lymph: No excessive bleeding, bruising or enlarged lymph nodes.  Physical Exam  BP 142/84   Pulse 88  Temp 97.3 F    Resp 16  Ht 5\' 9"    Wt 244 lb     BMI 36.02   General Appearance: Well nourished, in no apparent distress. Eyes: PERRLA, EOMs, conjunctiva no swelling or erythema, normal fundi and vessels. Sinuses: No frontal/maxillary tenderness ENT/Mouth: EACs patent / TMs  nl. Nares clear without erythema, swelling, mucoid exudates. Oral hygiene is good. No erythema, swelling, or exudate. Tongue normal, non-obstructing. Tonsils not swollen or erythematous. Hearing normal.  Neck: Supple, thyroid normal. No bruits, nodes or JVD. Respiratory: Respiratory effort normal.  BS equal and clear bilateral without rales, rhonci, wheezing or stridor. Cardio: Heart sounds are normal with regular rate and rhythm and no murmurs, rubs or gallops. Peripheral pulses are normal and equal bilaterally without edema. No aortic or femoral bruits. Chest: symmetric with normal excursions and percussion.  Abdomen: Flat, soft, with bowel sounds. Nontender, no guarding, rebound, hernias, masses, or organomegaly.  Lymphatics: Non tender without lymphadenopathy.  Genitourinary: No hernias.Testes nl. DRE - prostate nl for age - smooth & firm  w/o nodules. Musculoskeletal: Full ROM all peripheral extremities, joint stability, 5/5 strength, and normal gait. Skin: Warm and dry without rashes, lesions, cyanosis, clubbing or  ecchymosis.  Neuro: Cranial nerves intact, reflexes equal bilaterally. Normal muscle tone, no cerebellar symptoms. Sensation intact to the toes bilaterally by monofilament testing.  Pysch: Awake and oriented X 3 with normal affect, insight and judgment appropriate.   Assessment and Plan  1. Essential hypertension  - EKG 12-Lead - TSH - [place], RETROPERITNL ABD,  LTD  2. Uncontrolled secondary diabetes mellitus with stage 2 CKD (GFR 60-89)  - Microalbumin / creatinine urine ratio - [facility] DIABETES FOOT EXAM - LOW EXTREMITY NEUR EXAM DOCUM - Hemoglobin A1c  3. Hyperlipidemia  - Lipid panel  4. Vitamin D  deficiency  - Vit D  25 hydroxy (rtn osteoporosis monitoring)  5. Screening for rectal cancer  - POC Hemoccult Bld/Stl (3-Cd Home Screen); Future  6. Prostate cancer screening  - PSA  7. BMI 36.0-36.9,adult   8. Testosterone deficiency   9. Renal hematoma, right, subsequent encounter  - Ambulatory referral to Urology  10. Acute renal failure superimposed on stage 2 chronic kidney disease - Ambulatory referral to Nephrology  11. Other fatigue  - Vitamin B12 - Testosterone - Iron and TIBC - TSH  12. Encounter for long-term (current) use of medications  - Urine Microscopic - CBC with Differential/Platelet - BASIC METABOLIC PANEL WITH GFR - Hepatic function panel - Magnesium   Continue prudent diet as discussed, weight control, BP monitoring, regular exercise, and medications as discussed.  Discussed med effects and SE's. Routine screening labs and tests as requested with regular follow-up as recommended.  Over 40 minutes of exam, counseling &  chart review was performed

## 2015-07-20 NOTE — Patient Instructions (Signed)

## 2015-07-21 LAB — CBC WITH DIFFERENTIAL/PLATELET
Basophils Absolute: 0 10*3/uL (ref 0.0–0.1)
Basophils Relative: 0 % (ref 0–1)
Eosinophils Absolute: 0.1 10*3/uL (ref 0.0–0.7)
Eosinophils Relative: 1 % (ref 0–5)
HCT: 42.1 % (ref 39.0–52.0)
Hemoglobin: 14.4 g/dL (ref 13.0–17.0)
Lymphocytes Relative: 13 % (ref 12–46)
Lymphs Abs: 1.7 10*3/uL (ref 0.7–4.0)
MCH: 29 pg (ref 26.0–34.0)
MCHC: 34.2 g/dL (ref 30.0–36.0)
MCV: 84.7 fL (ref 78.0–100.0)
MPV: 9.6 fL (ref 8.6–12.4)
Monocytes Absolute: 2.1 10*3/uL — ABNORMAL HIGH (ref 0.1–1.0)
Monocytes Relative: 16 % — ABNORMAL HIGH (ref 3–12)
Neutro Abs: 9 10*3/uL — ABNORMAL HIGH (ref 1.7–7.7)
Neutrophils Relative %: 70 % (ref 43–77)
Platelets: 329 10*3/uL (ref 150–400)
RBC: 4.97 MIL/uL (ref 4.22–5.81)
RDW: 14.2 % (ref 11.5–15.5)
WBC: 12.9 10*3/uL — ABNORMAL HIGH (ref 4.0–10.5)

## 2015-07-21 LAB — MICROALBUMIN / CREATININE URINE RATIO
Creatinine, Urine: 180.9 mg/dL
Microalb Creat Ratio: 154.8 mg/g — ABNORMAL HIGH (ref 0.0–30.0)
Microalb, Ur: 28 mg/dL — ABNORMAL HIGH (ref <2.0)

## 2015-07-21 LAB — URINALYSIS, MICROSCOPIC ONLY
Bacteria, UA: NONE SEEN [HPF]
Casts: NONE SEEN [LPF]
Crystals: NONE SEEN [HPF]
RBC / HPF: >60 RBC/HPF — AB (ref <=2)
Yeast: NONE SEEN [HPF]

## 2015-07-21 LAB — BASIC METABOLIC PANEL WITH GFR
BUN: 30 mg/dL — ABNORMAL HIGH (ref 7–25)
CO2: 23 mmol/L (ref 20–31)
Calcium: 8.7 mg/dL (ref 8.6–10.3)
Chloride: 95 mmol/L — ABNORMAL LOW (ref 98–110)
Creat: 1.71 mg/dL — ABNORMAL HIGH (ref 0.70–1.33)
GFR, Est African American: 52 mL/min — ABNORMAL LOW (ref >=60)
GFR, Est Non African American: 45 mL/min — ABNORMAL LOW (ref >=60)
Glucose, Bld: 153 mg/dL — ABNORMAL HIGH (ref 65–99)
Potassium: 3.9 mmol/L (ref 3.5–5.3)
Sodium: 134 mmol/L — ABNORMAL LOW (ref 135–146)

## 2015-07-21 LAB — HEPATIC FUNCTION PANEL
ALT: 31 U/L (ref 9–46)
AST: 23 U/L (ref 10–35)
Albumin: 3.4 g/dL — ABNORMAL LOW (ref 3.6–5.1)
Alkaline Phosphatase: 65 U/L (ref 40–115)
Bilirubin, Direct: 0.4 mg/dL — ABNORMAL HIGH (ref <=0.2)
Indirect Bilirubin: 0.9 mg/dL (ref 0.2–1.2)
Total Bilirubin: 1.3 mg/dL — ABNORMAL HIGH (ref 0.2–1.2)
Total Protein: 6.8 g/dL (ref 6.1–8.1)

## 2015-07-21 LAB — IRON AND TIBC
%SAT: 7 % — ABNORMAL LOW (ref 20–55)
Iron: 14 ug/dL — ABNORMAL LOW (ref 42–165)
TIBC: 213 ug/dL — ABNORMAL LOW (ref 215–435)
UIBC: 199 ug/dL (ref 125–400)

## 2015-07-21 LAB — LIPID PANEL
Cholesterol: 105 mg/dL — ABNORMAL LOW (ref 125–200)
HDL: 13 mg/dL — ABNORMAL LOW (ref >=40)
LDL Cholesterol: 61 mg/dL (ref <130)
Total CHOL/HDL Ratio: 8.1 Ratio — ABNORMAL HIGH (ref <=5.0)
Triglycerides: 156 mg/dL — ABNORMAL HIGH (ref <150)
VLDL: 31 mg/dL — ABNORMAL HIGH (ref <30)

## 2015-07-21 LAB — VITAMIN B12: Vitamin B-12: 386 pg/mL (ref 211–911)

## 2015-07-21 LAB — TSH: TSH: 0.974 u[IU]/mL (ref 0.350–4.500)

## 2015-07-21 LAB — PSA: PSA: 3.04 ng/mL (ref <=4.00)

## 2015-07-21 LAB — HEMOGLOBIN A1C
Hgb A1c MFr Bld: 9.5 % — ABNORMAL HIGH (ref <5.7)
Mean Plasma Glucose: 226 mg/dL — ABNORMAL HIGH (ref <117)

## 2015-07-21 LAB — VITAMIN D 25 HYDROXY (VIT D DEFICIENCY, FRACTURES): Vit D, 25-Hydroxy: 77 ng/mL (ref 30–100)

## 2015-07-21 LAB — TESTOSTERONE: Testosterone: 778 ng/dL (ref 300–890)

## 2015-07-21 LAB — MAGNESIUM: Magnesium: 2.4 mg/dL (ref 1.5–2.5)

## 2015-07-25 ENCOUNTER — Ambulatory Visit (INDEPENDENT_AMBULATORY_CARE_PROVIDER_SITE_OTHER): Payer: BLUE CROSS/BLUE SHIELD | Admitting: Physician Assistant

## 2015-07-25 ENCOUNTER — Encounter: Payer: Self-pay | Admitting: Physician Assistant

## 2015-07-25 ENCOUNTER — Ambulatory Visit (HOSPITAL_COMMUNITY)
Admission: RE | Admit: 2015-07-25 | Discharge: 2015-07-25 | Disposition: A | Discharge status: Home / Self Care | Payer: BLUE CROSS/BLUE SHIELD | Source: Ambulatory Visit | Attending: Physician Assistant | Admitting: Physician Assistant

## 2015-07-25 VITALS — BP 132/80 | HR 80 | Temp 97.7°F | Resp 16 | Ht 70.0 in | Wt 242.6 lb

## 2015-07-25 DIAGNOSIS — R509 Fever, unspecified: Secondary | ICD-10-CM

## 2015-07-25 DIAGNOSIS — S37011D Minor contusion of right kidney, subsequent encounter: Secondary | ICD-10-CM

## 2015-07-25 DIAGNOSIS — J918 Pleural effusion in other conditions classified elsewhere: Secondary | ICD-10-CM | POA: Diagnosis not present

## 2015-07-25 DIAGNOSIS — Z79899 Other long term (current) drug therapy: Secondary | ICD-10-CM

## 2015-07-25 DIAGNOSIS — J189 Pneumonia, unspecified organism: Secondary | ICD-10-CM | POA: Insufficient documentation

## 2015-07-25 DIAGNOSIS — J9811 Atelectasis: Secondary | ICD-10-CM | POA: Insufficient documentation

## 2015-07-25 LAB — BASIC METABOLIC PANEL WITH GFR
BUN: 16 mg/dL (ref 7–25)
CO2: 28 mmol/L (ref 20–31)
Calcium: 9.3 mg/dL (ref 8.6–10.3)
Chloride: 98 mmol/L (ref 98–110)
Creat: 1.12 mg/dL (ref 0.70–1.33)
GFR, Est African American: 87 mL/min (ref >=60)
GFR, Est Non African American: 75 mL/min (ref >=60)
Glucose, Bld: 138 mg/dL — ABNORMAL HIGH (ref 65–99)
Potassium: 4.8 mmol/L (ref 3.5–5.3)
Sodium: 136 mmol/L (ref 135–146)

## 2015-07-25 MED ORDER — LEVOFLOXACIN 500 MG PO TABS: 500.0000 mg | ORAL_TABLET | Freq: Every day | ORAL | Status: DC

## 2015-07-25 MED ORDER — ACETAMINOPHEN-CODEINE #4 300-60 MG PO TABS: 1.0000 | ORAL_TABLET | ORAL | Status: DC | PRN

## 2015-07-25 NOTE — Patient Instructions (Addendum)
You can take tylenol (500mg ) or tylenol arthritis (650mg ). The max you can take of tylenol a day is 3000mg  daily, this is a max of 6 pills a day of the regular tyelnol (500mg ) or a max of 4 a day of the tylenol arthritis (650mg ) as long as no other medications you are taking contain tylenol.   Atelectasis Atelectasis is a collapse of the small air sacs in the lungs (alveoli). When this occurs, all or part of a lung collapses and becomes airless. It can be caused by various things and is a common problem after surgery. The severity of atelectasis will vary depending on the size of the area involved and the underlying cause of the condition. CAUSES  There are multiple causes for atelectasis:   Shallow breathing, particularly if there is an injury to your chest wall or abdomen that makes it painful to take a deep breath. This commonly occurs after surgery.  Obstruction of your airways (bronchi or bronchioles). This may be caused by a buildup of mucus (mucus plug), tumors, blood clots (pulmonary embolus), or inhaled foreign bodies. Mucus plugs occur when the lungs do not expand enough to get rid of mucus.  Outside pressure on the lung. This may be caused by tumors, fluid (pleural effusion), or a leakage of air between the lung and rib cage (pneumothorax).   Infections such as pneumonia.  Scarring in lung tissue left over from previous infection or injury.  Some diseases such as cystic fibrosis. SIGNS AND SYMPTOMS  Often, atelectasis will have no symptoms. When symptoms occur, they include:  Shortness of breath.   Bluish color to your nails, lips, or mouth (cyanosis). DIAGNOSIS  Your health care provider may suspect atelectasis based on symptoms and physical findings. A chest X-ray may be done to confirm the diagnosis. More specialized X-ray exams are sometimes required.  TREATMENT  Treatment will depend on the cause of the atelectasis. Treatment may include:  Purposeful coughing to loosen  mucus plugs in the lungs.  Chest physiotherapy. This consists of clapping or percussion on the chest over the lungs to further loosen mucus plugs.  Postural drainage techniques. This involves positioning your body so your head is lower than your chest. [place] relaxed deep breathing whenever you are sitting down. A good technique is to take a few relaxed deep breaths each time a commercial comes on if you are watching television.  If you were given a deep breathing device (such as an incentive spirometer) or a mucus clearance device, use this regularly as directed by your health care provider.  Try to cough several times a day as directed by your health care provider.  Perform any chest physiotherapy or postural drainage techniques as directed by your health care provider. If necessary, have someone (such as a family member) assist you with these techniques.  When lying down, lie on the unaffected side to encourage mucus drainage.  Stay physically active as much as possible. SEEK IMMEDIATE MEDICAL CARE IF:   You develop increasing problems with your breathing.   You develop severe chest pain.   You develop severe coughing, or you cough up blood.   You have a fever or persistent symptoms for more than 2-3 days.   You have a fever and your symptoms suddenly get worse.  MAKE SURE YOU:  Understand these instructions.  Will watch your condition.  Will get help right away if you are not doing well or get worse. Document Released: 11/26/2005 Document  Revised: 12/01/2013 Document Reviewed: 06/03/2013 [facility] Patient Information 2015 [place], [place]. This information is not intended to replace advice given to you by your health care provider. Make sure you discuss any questions you have with your health care provider.

## 2015-07-25 NOTE — Addendum Note (Signed)
Addended by: Vicie Mutters R on: 07/25/2015 03:04 PM   Modules accepted: Orders

## 2015-07-25 NOTE — Progress Notes (Addendum)
   Subjective:    Patient ID: Russell [name], male    DOB: 21-Jul-1961, 54 y.o.   MRN: [id number]  HPI 54 y.o. AAM with history of DM 2 with CKD, HTN, chol, with recent hospitalization in August 08 for right back and right AB pain, fund to have dehydration and right peri-nephric hematoma. He has been referred to urology on 22nd and nephrology on the 23rd. Last given oxycodone 10mg , #20 by the ER on 08/08.   He state he has been having fever all weekend, highest is 102, and has been taking tylenol. He has not been taking the oxycodone. Wife states he has some chills, he is very fatigued with movement, he has decreased appetite, increased urinary frequency, urine will turn brown with movement, he has had some cough and admits to splinting due to pain, no SOB,CP  denies N/V, diarrhea, constipation.   Lab Results  Component Value Date   WBC 12.9* 07/20/2015   HGB 14.4 07/20/2015   HCT 42.1 07/20/2015   MCV 84.7 07/20/2015   PLT 329 07/20/2015   Lab Results  Component Value Date   CREATININE 1.71* 07/20/2015   BUN 30* 07/20/2015   NA 134* 07/20/2015   K 3.9 07/20/2015   CL 95* 07/20/2015   CO2 23 07/20/2015    Lab Results  Component Value Date   GFRAA 52* 07/20/2015    Blood pressure 132/80, pulse 80, temperature 97.7 F (36.5 C), resp. rate 16, height 5\' 10"  (1.778 m), weight 242 lb 9.6 oz (110.043 kg).  Review of Systems  Constitutional: Positive for fever, chills, appetite change and fatigue. Negative for diaphoresis, activity change and unexpected weight change.  HENT: Negative.   Respiratory: Positive for cough. Negative for apnea, choking, chest tightness, shortness of breath, wheezing and stridor.   Cardiovascular: Negative.  Negative for chest pain, palpitations and leg swelling.  Gastrointestinal: Positive for abdominal pain and abdominal distention. Negative for nausea, vomiting, diarrhea, constipation, blood in stool, anal bleeding and rectal pain.  Genitourinary:  Positive for frequency. Negative for dysuria, urgency, hematuria, flank pain, decreased urine volume, discharge, penile swelling, scrotal swelling, enuresis, difficulty urinating, genital sores, penile pain and testicular pain.  Musculoskeletal: Negative.   Skin: Negative.   Neurological: Negative.       Objective:   Physical Exam  Constitutional: He is oriented to person, place, and time. He appears well-developed and well-nourished. No distress.  Pulmonary/Chest: Effort normal. He has no rales. He exhibits no tenderness.  Course breath sounds bilateral bases  Abdominal: He exhibits distension and mass (right flank mass, tender to palpation. ). There is tenderness. There is guarding. There is no rebound.  Neurological: He is alert and oriented to person, place, and time.  Skin: No rash noted.        Assessment & Plan:  FUO Mild cough with course breath sounds- he is splinting due to pain, will get CXR RU atelectasis versus infection Recheck CBC, BMP- had leukocytosis without left shift, could be viral.   Right hematoma- Declines narcotic pain meds, will try tylenol #4 Go to ER if worsening pain, N/V, blood in urine.   Addendum: CXR shows PNA, sent in levaquin, follow up on 22nd   Future Appointments Date Time Provider [place]  08/01/2015 9:30 AM Unk Pinto, MD [facility] None  10/25/2015 9:30 AM Starlyn Skeans, PA-C [facility] None  02/01/2016 8:45 AM Unk Pinto, MD [facility] None

## 2015-07-26 LAB — CBC WITH DIFFERENTIAL/PLATELET
Basophils Absolute: 0 10*3/uL (ref 0.0–0.1)
Basophils Relative: 0 % (ref 0–1)
Eosinophils Absolute: 0.1 10*3/uL (ref 0.0–0.7)
Eosinophils Relative: 1 % (ref 0–5)
HCT: 41.4 % (ref 39.0–52.0)
Hemoglobin: 13.9 g/dL (ref 13.0–17.0)
Lymphocytes Relative: 16 % (ref 12–46)
Lymphs Abs: 1.9 10*3/uL (ref 0.7–4.0)
MCH: 28 pg (ref 26.0–34.0)
MCHC: 33.6 g/dL (ref 30.0–36.0)
MCV: 83.3 fL (ref 78.0–100.0)
MPV: 8.4 fL — ABNORMAL LOW (ref 8.6–12.4)
Monocytes Absolute: 1.7 10*3/uL — ABNORMAL HIGH (ref 0.1–1.0)
Monocytes Relative: 14 % — ABNORMAL HIGH (ref 3–12)
Neutro Abs: 8.1 10*3/uL — ABNORMAL HIGH (ref 1.7–7.7)
Neutrophils Relative %: 69 % (ref 43–77)
Platelets: 572 10*3/uL — ABNORMAL HIGH (ref 150–400)
RBC: 4.97 MIL/uL (ref 4.22–5.81)
RDW: 14.5 % (ref 11.5–15.5)
WBC: 11.8 10*3/uL — ABNORMAL HIGH (ref 4.0–10.5)

## 2015-07-29 ENCOUNTER — Encounter: Payer: Self-pay | Admitting: Internal Medicine

## 2015-07-30 ENCOUNTER — Other Ambulatory Visit: Payer: Self-pay | Admitting: Internal Medicine

## 2015-08-01 ENCOUNTER — Encounter: Payer: Self-pay | Admitting: Internal Medicine

## 2015-08-01 ENCOUNTER — Other Ambulatory Visit: Payer: Self-pay | Admitting: *Deleted

## 2015-08-01 ENCOUNTER — Ambulatory Visit (INDEPENDENT_AMBULATORY_CARE_PROVIDER_SITE_OTHER): Payer: BLUE CROSS/BLUE SHIELD | Admitting: Internal Medicine

## 2015-08-01 VITALS — BP 116/82 | HR 64 | Temp 97.0°F | Resp 16 | Ht 69.0 in | Wt 236.8 lb

## 2015-08-01 DIAGNOSIS — J189 Pneumonia, unspecified organism: Secondary | ICD-10-CM

## 2015-08-01 DIAGNOSIS — I10 Essential (primary) hypertension: Secondary | ICD-10-CM | POA: Diagnosis not present

## 2015-08-01 DIAGNOSIS — E119 Type 2 diabetes mellitus without complications: Secondary | ICD-10-CM | POA: Diagnosis not present

## 2015-08-01 DIAGNOSIS — E291 Testicular hypofunction: Secondary | ICD-10-CM | POA: Diagnosis not present

## 2015-08-01 DIAGNOSIS — Z79899 Other long term (current) drug therapy: Secondary | ICD-10-CM

## 2015-08-01 DIAGNOSIS — J181 Lobar pneumonia, unspecified organism: Secondary | ICD-10-CM

## 2015-08-01 LAB — CBC WITH DIFFERENTIAL/PLATELET
Basophils Absolute: 0 10*3/uL (ref 0.0–0.1)
Basophils Relative: 0 % (ref 0–1)
Eosinophils Absolute: 0.1 10*3/uL (ref 0.0–0.7)
Eosinophils Relative: 2 % (ref 0–5)
HCT: 45.5 % (ref 39.0–52.0)
Hemoglobin: 14.5 g/dL (ref 13.0–17.0)
Lymphocytes Relative: 25 % (ref 12–46)
Lymphs Abs: 1.9 10*3/uL (ref 0.7–4.0)
MCH: 27 pg (ref 26.0–34.0)
MCHC: 31.9 g/dL (ref 30.0–36.0)
MCV: 84.7 fL (ref 78.0–100.0)
MPV: 8 fL — ABNORMAL LOW (ref 8.6–12.4)
Monocytes Absolute: 1 10*3/uL (ref 0.1–1.0)
Monocytes Relative: 14 % — ABNORMAL HIGH (ref 3–12)
Neutro Abs: 4.4 10*3/uL (ref 1.7–7.7)
Neutrophils Relative %: 59 % (ref 43–77)
Platelets: 810 10*3/uL — ABNORMAL HIGH (ref 150–400)
RBC: 5.37 MIL/uL (ref 4.22–5.81)
RDW: 14.4 % (ref 11.5–15.5)
WBC: 7.4 10*3/uL (ref 4.0–10.5)

## 2015-08-01 LAB — BASIC METABOLIC PANEL WITH GFR
BUN: 19 mg/dL (ref 7–25)
CO2: 28 mmol/L (ref 20–31)
Calcium: 9.3 mg/dL (ref 8.6–10.3)
Chloride: 99 mmol/L (ref 98–110)
Creat: 1.3 mg/dL (ref 0.70–1.33)
GFR, Est African American: 73 mL/min (ref >=60)
GFR, Est Non African American: 63 mL/min (ref >=60)
Glucose, Bld: 152 mg/dL — ABNORMAL HIGH (ref 65–99)
Potassium: 5 mmol/L (ref 3.5–5.3)
Sodium: 136 mmol/L (ref 135–146)

## 2015-08-01 MED ORDER — TESTOSTERONE CYPIONATE 200 MG/ML IM SOLN
400.0000 mg | Freq: Once | INTRAMUSCULAR | Status: AC
  Administered 2015-08-01: 400 mg via INTRAMUSCULAR

## 2015-08-01 MED ORDER — TESTOSTERONE CYPIONATE 200 MG/ML IM SOLN: 400.0000 mg | INTRAMUSCULAR | Status: DC

## 2015-08-01 MED ORDER — INSULIN DETEMIR 100 UNIT/ML FLEXPEN: PEN_INJECTOR | SUBCUTANEOUS | Status: DC

## 2015-08-01 NOTE — Progress Notes (Signed)
Subjective:    Patient ID: Russell [name], male    DOB: 08-20-1961, 54 y.o.   MRN: [id number]  HPI %54 yo MBM w/ HTN , T2IDM recently hospitalized 8/5-07/2015 with right flank pain and ? Spontaneous perinephric hematoma & Acute on Chronic Kidney damage  Was rehydrated with improvement in renal indices. Eval here with fever on 07/25/2015 finding a RLL patchy PNA treated w/Levaquin. BMET then was essentially back to Nl. Today reports continued improvement with resolution of right abd/flank pains & denies fever/chills/rash. Has appt later today w/Dr Jeffie Pollock wrt the perinephric hematoma. Had appt scheduled tomorrow with nephrology and advised since renal functions back to Nl that it could be deferred. Reports FBG's still ~ 140-160 on Levemir and still off Metformin , Losartin & HCTZ.  Medication Sig  . TYLENOL #4 300-60 MG per tablet Take 1 tablet by mouth every 4 (four) hours as needed for moderate pain.  Marland Kitchen amLODipine  10 MG tablet Take 1 tablet (10 mg total) by mouth daily.  Marland Kitchen atorvastatin 40 MG tablet Take 0.5 tablets (20 mg total) by mouth daily.  . bisoprolol  10 MG tablet Take 1 tablet (10 mg total) by mouth daily.  . ciclesonide () 50 MCG/ACT nasal spray Place 2 sprays into both nostrils daily as needed for allergies.   . clotrimazole-betamethasone (LOTRISONE) cream Apply 3 times a day as needed (Patient taking differently: Apply 1 application topically 3 (three) times daily as needed (rash). Apply 3 times a day as needed)  . ezetimibe (ZETIA) 10 MG tablet Take 1 tablet (10 mg total) by mouth daily. for cholesterol  . fluconazole (DIFLUCAN) 150 MG tablet Take 1 tab by mouth once a week as needed for yeast infections)  . levofloxacin (LEVAQUIN) 500 MG tablet Take 1 tablet (500 mg total) by mouth daily.  Marland Kitchen oxyCODONE 10 MG TABS Take 1 tablet (10 mg total) by mouth every 4 (four) hours as needed for moderate pain or severe pain.  . tadalafil (CIALIS) 20 MG tablet Take 1/2 to 1 tablet every 2-3 days as  needed (Patient taking differently: Take 10-20 mg by mouth daily as needed for erectile dysfunction. Take 1/2 to 1 tablet every 2-3 days as needed)  . Vitamin D 50,000 UNITS CAPS Take 50,000 Units by mouth 2 (two) times a week. Sunday and thursdays)  . LEVEMIR FLEXTOUCH 100 UNIT/ML Pen START WITH 10 UNITS AND TITRATE AS DIRECTED  . DEPO-TESTOSTERONE  400 mg injec Inject 2 mLs (400 mg total) into the muscle every 14 (fourteen) days.   Allergies  Allergen Reactions  . 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 Hypertension   . Hyperlipidemia   . Diabetes mellitus   . Hypogonadism male   . Chronic sinusitis   . Allergy   . Allergic rhinitis    Past Surgical History  Procedure Laterality Date  . Wisdom tooth extraction    . Hernia repair     Review of Systems 10 point systems review negative except as above.    Objective:   Physical Exam  BP 116/82 mmHg  Pulse 64  Temp(Src) 97 F (36.1 C)  Resp 16  Ht 5\' 9"  (1.753 m)  Wt 236 lb 12.8 oz (107.412 kg)  BMI 34.95 kg/m2  HEENT - Eac's patent. TM's Nl. EOM's full. PERRLA. NasoOroPharynx clear. Neck - supple. Nl Thyroid. Carotids 2+ & No  bruits, nodes, JVD Chest - Clear equal BS w/o Rales, rhonchi, wheezes. Cor - Nl HS. RRR w/o sig MGR. PP 1(+). No edema. Abd - Soft with no palpable masses today MS- FROM w/o deformities. Muscle power, tone and bulk Nl. Gait Nl. Neuro - No obvious Cr N abnormalities. Sensory, motor and Cerebellar functions appear Nl w/o focal abnormalities. Psyche - Mental status normal & appropriate.  No delusions, ideations or obvious mood abnormalities.    Assessment & Plan:   1. Essential hypertension   2. Diabetes mellitus without complication   3. Right lower lobe pneumonia   4. Medication management  - CBC with Differential/Platelet - BASIC METABOLIC PANEL WITH GFR -ROV 3 week for  ongoing assessment of renal functions  5. Hypogonadism in male  -  DEPO- testosterone injection 400 mg; Inject 2 mLs (400 mg total) into the muscle once.  - Discussed meds /SE and OK'd to resume Metformin - starting low at 2-3/day

## 2015-08-03 ENCOUNTER — Other Ambulatory Visit: Payer: Self-pay | Admitting: Internal Medicine

## 2015-08-06 ENCOUNTER — Encounter: Payer: Self-pay | Admitting: *Deleted

## 2015-08-08 ENCOUNTER — Other Ambulatory Visit: Payer: Self-pay | Admitting: Urology

## 2015-08-08 DIAGNOSIS — S37011A Minor contusion of right kidney, initial encounter: Secondary | ICD-10-CM

## 2015-08-12 ENCOUNTER — Ambulatory Visit (INDEPENDENT_AMBULATORY_CARE_PROVIDER_SITE_OTHER): Payer: BLUE CROSS/BLUE SHIELD | Admitting: *Deleted

## 2015-08-12 DIAGNOSIS — E291 Testicular hypofunction: Secondary | ICD-10-CM

## 2015-08-12 MED ORDER — TESTOSTERONE CYPIONATE 200 MG/ML IM SOLN
400.0000 mg | Freq: Once | INTRAMUSCULAR | Status: AC
  Administered 2015-08-12: 400 mg via INTRAMUSCULAR

## 2015-08-12 NOTE — Progress Notes (Signed)
Patient ID: Russell [name], male   DOB: 09/28/1961, 53 y.o.   MRN: [id number] Patient presents for testosterone injection for hypogonadism Tx.  Patient received 2 cc IM left glute and tolerated well. 

## 2015-08-16 ENCOUNTER — Other Ambulatory Visit: Payer: Self-pay | Admitting: *Deleted

## 2015-08-16 MED ORDER — AMLODIPINE BESYLATE 10 MG PO TABS: 10.0000 mg | ORAL_TABLET | Freq: Every day | ORAL | Status: DC

## 2015-08-16 MED ORDER — BISOPROLOL FUMARATE 10 MG PO TABS: 10.0000 mg | ORAL_TABLET | Freq: Every day | ORAL | Status: DC

## 2015-08-22 ENCOUNTER — Ambulatory Visit (HOSPITAL_COMMUNITY)
Admission: RE | Admit: 2015-08-22 | Discharge: 2015-08-22 | Disposition: A | Discharge status: Home / Self Care | Payer: BLUE CROSS/BLUE SHIELD | Source: Ambulatory Visit | Attending: Urology | Admitting: Urology

## 2015-08-22 DIAGNOSIS — X58XXXA Exposure to other specified factors, initial encounter: Secondary | ICD-10-CM | POA: Insufficient documentation

## 2015-08-22 DIAGNOSIS — S37011A Minor contusion of right kidney, initial encounter: Secondary | ICD-10-CM | POA: Diagnosis not present

## 2015-08-22 MED ORDER — GADOBENATE DIMEGLUMINE 529 MG/ML IV SOLN: 20.0000 mL | Freq: Once | INTRAVENOUS | Status: DC | PRN

## 2015-08-26 ENCOUNTER — Ambulatory Visit (INDEPENDENT_AMBULATORY_CARE_PROVIDER_SITE_OTHER): Payer: BLUE CROSS/BLUE SHIELD

## 2015-08-26 DIAGNOSIS — E291 Testicular hypofunction: Secondary | ICD-10-CM

## 2015-08-26 MED ORDER — TESTOSTERONE CYPIONATE 200 MG/ML IM SOLN
400.0000 mg | INTRAMUSCULAR | Status: DC
  Administered 2015-08-26: 400 mg via INTRAMUSCULAR

## 2015-08-26 NOTE — Progress Notes (Signed)
Patient presents for testosterone injection for hypogonadism Tx. Patient received 2 cc IM right glute and tolerated well

## 2015-09-01 ENCOUNTER — Encounter: Payer: Self-pay | Admitting: Internal Medicine

## 2015-09-01 ENCOUNTER — Ambulatory Visit (INDEPENDENT_AMBULATORY_CARE_PROVIDER_SITE_OTHER): Payer: BLUE CROSS/BLUE SHIELD | Admitting: Internal Medicine

## 2015-09-01 VITALS — BP 142/88 | HR 90 | Temp 98.2°F | Resp 18 | Ht 69.0 in | Wt 248.0 lb

## 2015-09-01 DIAGNOSIS — E559 Vitamin D deficiency, unspecified: Secondary | ICD-10-CM

## 2015-09-01 DIAGNOSIS — N179 Acute kidney failure, unspecified: Secondary | ICD-10-CM | POA: Diagnosis not present

## 2015-09-01 DIAGNOSIS — E785 Hyperlipidemia, unspecified: Secondary | ICD-10-CM | POA: Diagnosis not present

## 2015-09-01 DIAGNOSIS — Z79899 Other long term (current) drug therapy: Secondary | ICD-10-CM | POA: Diagnosis not present

## 2015-09-01 DIAGNOSIS — I10 Essential (primary) hypertension: Secondary | ICD-10-CM

## 2015-09-01 LAB — HEPATIC FUNCTION PANEL
ALT: 12 U/L (ref 9–46)
AST: 10 U/L (ref 10–35)
Albumin: 4.1 g/dL (ref 3.6–5.1)
Alkaline Phosphatase: 78 U/L (ref 40–115)
Bilirubin, Direct: 0.1 mg/dL (ref <=0.2)
Indirect Bilirubin: 0.3 mg/dL (ref 0.2–1.2)
Total Bilirubin: 0.4 mg/dL (ref 0.2–1.2)
Total Protein: 7.3 g/dL (ref 6.1–8.1)

## 2015-09-01 LAB — BASIC METABOLIC PANEL WITH GFR
BUN: 17 mg/dL (ref 7–25)
CO2: 26 mmol/L (ref 20–31)
Calcium: 9.9 mg/dL (ref 8.6–10.3)
Chloride: 102 mmol/L (ref 98–110)
Creat: 1.07 mg/dL (ref 0.70–1.33)
GFR, Est African American: >89 mL/min (ref >=60)
GFR, Est Non African American: 79 mL/min (ref >=60)
Glucose, Bld: 198 mg/dL — ABNORMAL HIGH (ref 65–99)
Potassium: 4.4 mmol/L (ref 3.5–5.3)
Sodium: 139 mmol/L (ref 135–146)

## 2015-09-01 LAB — CBC WITH DIFFERENTIAL/PLATELET
Basophils Absolute: 0 10*3/uL (ref 0.0–0.1)
Basophils Relative: 0 % (ref 0–1)
Eosinophils Absolute: 0.2 10*3/uL (ref 0.0–0.7)
Eosinophils Relative: 2 % (ref 0–5)
HCT: 48.9 % (ref 39.0–52.0)
Hemoglobin: 15.8 g/dL (ref 13.0–17.0)
Lymphocytes Relative: 33 % (ref 12–46)
Lymphs Abs: 2.5 10*3/uL (ref 0.7–4.0)
MCH: 26.9 pg (ref 26.0–34.0)
MCHC: 32.3 g/dL (ref 30.0–36.0)
MCV: 83.3 fL (ref 78.0–100.0)
MPV: 9.3 fL (ref 8.6–12.4)
Monocytes Absolute: 0.8 10*3/uL (ref 0.1–1.0)
Monocytes Relative: 11 % (ref 3–12)
Neutro Abs: 4.2 10*3/uL (ref 1.7–7.7)
Neutrophils Relative %: 54 % (ref 43–77)
Platelets: 430 10*3/uL — ABNORMAL HIGH (ref 150–400)
RBC: 5.87 MIL/uL — ABNORMAL HIGH (ref 4.22–5.81)
RDW: 15.3 % (ref 11.5–15.5)
WBC: 7.7 10*3/uL (ref 4.0–10.5)

## 2015-09-01 LAB — MAGNESIUM: Magnesium: 1.6 mg/dL (ref 1.5–2.5)

## 2015-09-01 NOTE — Progress Notes (Signed)
Subjective:    Patient ID: Russell [name], male    DOB: 24-Feb-1961, 54 y.o.   MRN: [id number]  HPI  Patient presents to the office for evaluation of renal functions after having a perirenal hemorrhage with decrease in kidney functions and AKI.  He was admitted to the hospital and was hydrated. He reports that he has been following with both Dr. Moshe Cipro and also with Dr. Jeffie Pollock at urology.  They are looking for any evidence of a renal neoplasm.  There was no evidence of neoplasm on MRI with contrast.  He is due to have a renal ultrasound performed by Dr. Jeffie Pollock which is currently being scheduled.  He reports that his blood pressure is getting back to normal.    Patient reports that he has been doing metformin again.  He has still be using the levemir but holds his insulin if he is less than 120.  He reports that if he is eating prior to 7:30 and he is limiting meats and he is generally been around 128-130.  He reports that if he eats after 7:30 he usually has BS in the 160s.    Review of Systems  Constitutional: Negative for fever, chills and fatigue.  HENT: Negative for congestion, rhinorrhea, sinus pressure and trouble swallowing.   Respiratory: Negative for chest tightness and shortness of breath.   Cardiovascular: Negative for chest pain and palpitations.  Gastrointestinal: Negative for nausea, abdominal pain, diarrhea, constipation and abdominal distention.  Genitourinary: Negative for dysuria, urgency, hematuria, flank pain, difficulty urinating and penile pain.  Musculoskeletal: Negative.   Neurological: Negative for dizziness, speech difficulty, weakness and light-headedness.  Psychiatric/Behavioral: Negative for self-injury, decreased concentration and agitation. The patient is not nervous/anxious.        Objective:   Physical Exam  Constitutional: He is oriented to person, place, and time. He appears well-developed and well-nourished. No distress.  HENT:  Head: Normocephalic.   Mouth/Throat: Oropharynx is clear and moist. No oropharyngeal exudate.  Eyes: Conjunctivae are normal. No scleral icterus.  Neck: Normal range of motion. Neck supple. No JVD present. No thyromegaly present.  Cardiovascular: Normal rate, regular rhythm, normal heart sounds and intact distal pulses.  Exam reveals no gallop and no friction rub.   No murmur heard. Pulmonary/Chest: Effort normal and breath sounds normal. No respiratory distress. He has no wheezes. He has no rales. He exhibits no tenderness.  Abdominal: Soft. Bowel sounds are normal. He exhibits no distension and no mass. There is no tenderness. There is no rebound and no guarding.  Musculoskeletal: Normal range of motion.  Lymphadenopathy:    He has no cervical adenopathy.  Neurological: He is alert and oriented to person, place, and time. No cranial nerve deficit. Coordination normal.  Skin: Skin is warm and dry. He is not diaphoretic.  Psychiatric: He has a normal mood and affect. His behavior is normal. Judgment and thought content normal.  Nursing note and vitals reviewed.   Filed Vitals:   09/01/15 0947  BP: 142/88  Pulse: 90  Temp: 98.2 F (36.8 C)  Resp: 18   Wt Readings from Last 3 Encounters:  09/01/15 248 lb (112.492 kg)  08/22/15 236 lb (107.049 kg)  08/01/15 236 lb 12.8 oz (107.412 kg)          Assessment & Plan:    1. Essential hypertension -cont meds  2. AKI (acute kidney injury) -resolved -restarting medications  3. Medication management  - CBC with Differential/Platelet - BASIC METABOLIC PANEL WITH GFR -  Hepatic function panel - Magnesium  4. Hyperlipidemia -cont meds  5. [name] -cont meds

## 2015-09-09 ENCOUNTER — Other Ambulatory Visit: Payer: Self-pay | Admitting: *Deleted

## 2015-09-09 ENCOUNTER — Ambulatory Visit (INDEPENDENT_AMBULATORY_CARE_PROVIDER_SITE_OTHER): Payer: BLUE CROSS/BLUE SHIELD | Admitting: *Deleted

## 2015-09-09 DIAGNOSIS — E291 Testicular hypofunction: Secondary | ICD-10-CM | POA: Diagnosis not present

## 2015-09-09 MED ORDER — TESTOSTERONE CYPIONATE 200 MG/ML IM SOLN
400.0000 mg | Freq: Once | INTRAMUSCULAR | Status: DC
  Administered 2015-09-09: 400 mg via INTRAMUSCULAR

## 2015-09-09 MED ORDER — VITAMIN D (ERGOCALCIFEROL) 1.25 MG (50000 UNIT) PO CAPS: ORAL_CAPSULE | ORAL | Status: DC

## 2015-09-09 NOTE — Progress Notes (Signed)
Patient ID: Russell [name], male   DOB: 19-Nov-1961, 54 y.o.   MRN: [id number] Patient here for NV to get Testosterone injection.  Patient received Testosterone Cypionate 200 mg/ml 2 ml IM in LUOQ.  Patient tolerated well.

## 2015-09-19 ENCOUNTER — Other Ambulatory Visit: Payer: Self-pay | Admitting: Internal Medicine

## 2015-09-23 ENCOUNTER — Ambulatory Visit (INDEPENDENT_AMBULATORY_CARE_PROVIDER_SITE_OTHER): Payer: BLUE CROSS/BLUE SHIELD | Admitting: *Deleted

## 2015-09-23 DIAGNOSIS — E291 Testicular hypofunction: Secondary | ICD-10-CM

## 2015-09-23 MED ORDER — TESTOSTERONE CYPIONATE 200 MG/ML IM SOLN
400.0000 mg | Freq: Once | INTRAMUSCULAR | Status: AC
  Administered 2015-09-23: 400 mg via INTRAMUSCULAR

## 2015-09-23 NOTE — Progress Notes (Signed)
Patient ID: Russell [name], male   DOB: [birth year], 54 y.o.   MRN: [id number] Patient her for Testosterone injection.  Testosterone Cypionate 200 mg/ml 2 ml IM in right UOQ. Patient tolerated well.

## 2015-09-30 ENCOUNTER — Other Ambulatory Visit: Payer: Self-pay | Admitting: Internal Medicine

## 2015-10-07 ENCOUNTER — Other Ambulatory Visit: Payer: Self-pay

## 2015-10-07 ENCOUNTER — Ambulatory Visit (INDEPENDENT_AMBULATORY_CARE_PROVIDER_SITE_OTHER): Payer: BLUE CROSS/BLUE SHIELD

## 2015-10-07 DIAGNOSIS — E291 Testicular hypofunction: Secondary | ICD-10-CM | POA: Diagnosis not present

## 2015-10-07 MED ORDER — TESTOSTERONE CYPIONATE 200 MG/ML IM SOLN
400.0000 mg | Freq: Once | INTRAMUSCULAR | Status: AC
  Administered 2015-10-07: 400 mg via INTRAMUSCULAR

## 2015-10-07 MED ORDER — CLOTRIMAZOLE-BETAMETHASONE 1-0.05 % EX CREA: TOPICAL_CREAM | CUTANEOUS | Status: DC

## 2015-10-07 MED ORDER — FLUCONAZOLE 150 MG PO TABS: ORAL_TABLET | ORAL | Status: DC

## 2015-10-12 ENCOUNTER — Other Ambulatory Visit: Payer: Self-pay | Admitting: Internal Medicine

## 2015-10-12 DIAGNOSIS — N521 Erectile dysfunction due to diseases classified elsewhere: Secondary | ICD-10-CM

## 2015-10-12 DIAGNOSIS — E1159 Type 2 diabetes mellitus with other circulatory complications: Secondary | ICD-10-CM | POA: Insufficient documentation

## 2015-10-15 ENCOUNTER — Other Ambulatory Visit: Payer: Self-pay | Admitting: Internal Medicine

## 2015-10-21 ENCOUNTER — Ambulatory Visit: Payer: Self-pay

## 2015-10-25 ENCOUNTER — Encounter: Payer: Self-pay | Admitting: Internal Medicine

## 2015-10-25 ENCOUNTER — Ambulatory Visit (INDEPENDENT_AMBULATORY_CARE_PROVIDER_SITE_OTHER): Payer: BLUE CROSS/BLUE SHIELD | Admitting: Internal Medicine

## 2015-10-25 VITALS — BP 128/76 | HR 76 | Temp 98.0°F | Resp 18 | Ht 69.0 in | Wt 245.0 lb

## 2015-10-25 DIAGNOSIS — E1365 Other specified diabetes mellitus with hyperglycemia: Secondary | ICD-10-CM | POA: Diagnosis not present

## 2015-10-25 DIAGNOSIS — E1322 Other specified diabetes mellitus with diabetic chronic kidney disease: Secondary | ICD-10-CM | POA: Diagnosis not present

## 2015-10-25 DIAGNOSIS — N182 Chronic kidney disease, stage 2 (mild): Secondary | ICD-10-CM

## 2015-10-25 DIAGNOSIS — I10 Essential (primary) hypertension: Secondary | ICD-10-CM | POA: Diagnosis not present

## 2015-10-25 DIAGNOSIS — Z79899 Other long term (current) drug therapy: Secondary | ICD-10-CM

## 2015-10-25 DIAGNOSIS — IMO0002 Reserved for concepts with insufficient information to code with codable children: Secondary | ICD-10-CM

## 2015-10-25 DIAGNOSIS — E785 Hyperlipidemia, unspecified: Secondary | ICD-10-CM | POA: Diagnosis not present

## 2015-10-25 DIAGNOSIS — E291 Testicular hypofunction: Secondary | ICD-10-CM

## 2015-10-25 DIAGNOSIS — E349 Endocrine disorder, unspecified: Secondary | ICD-10-CM

## 2015-10-25 MED ORDER — TESTOSTERONE CYPIONATE 200 MG/ML IM SOLN
400.0000 mg | Freq: Once | INTRAMUSCULAR | Status: AC
  Administered 2015-10-25: 400 mg via INTRAMUSCULAR

## 2015-10-25 MED ORDER — TESTOSTERONE CYPIONATE 200 MG/ML IM SOLN: 400.0000 mg | Freq: Once | INTRAMUSCULAR | Status: DC

## 2015-10-25 NOTE — Addendum Note (Signed)
Addended by: Collette Pescador A on: 10/25/2015 11:52 AM   Modules accepted: Orders

## 2015-10-25 NOTE — Progress Notes (Signed)
Patient ID: Russell [name], male   DOB: May 27, 1961, 54 y.o.   MRN: [id number]  Assessment and Plan:  Hypertension:  -Continue medication -monitor blood pressure at home. -Continue DASH diet -Reminder to go to the ER if any CP, SOB, nausea, dizziness, severe HA, changes vision/speech, left arm numbness and tingling and jaw pain.  Cholesterol - Continue diet and exercise -Check cholesterol.   Diabetes with diabetic chronic kidney disease -Continue diet and exercise.  -Check A1C  Vitamin D Def -check level -continue medications.   Testosterone Def -given injection today -check level  Continue diet and meds as discussed. Further disposition pending results of labs. Discussed med's effects and SE's.    HPI 54 y.o. male  presents for 3 month follow up with hypertension, hyperlipidemia, diabetes and [name].   His blood pressure has been controlled at home, today their BP is BP: 128/76 mmHg.He does workout. He denies chest pain, shortness of breath, dizziness. He reports that during the week he is doing a lot more vegetables and fruits.  He does tend to have some meat on the weekends.  He reports that he has had to get a lot of clothes tailored and getting smaller sizes.     He is on cholesterol medication and denies myalgias. His cholesterol is at goal. The cholesterol was:  07/20/2015: Cholesterol 105*; HDL 13*; LDL Cholesterol 61; Triglycerides 156*   He has been working on diet and exercise for diabetes with diabetic chronic kidney disease, he is on bASA, he is on ACE/ARB, and denies  foot ulcerations, hyperglycemia, hypoglycemia , increased appetite, nausea, paresthesia of the feet, polydipsia, polyuria, visual disturbances, vomiting and weight loss. Last A1C was: 07/20/2015: Hgb A1c MFr Bld 9.5*   Patient is on Vitamin D supplement. 07/20/2015: Vit D, 25-Hydroxy 77  He was recently seen by [facility] and was told to be seen again one more time.  He reports that he  is due to have one more renal ultrasound.  He reports that he still has some hematuria in his urine. He is also due to see Dr. Jeffie Pollock again as well.   He had his most recent testosterone shot 3 weeks ago.    Current Medications:  Current Outpatient Prescriptions on File Prior to Visit  Medication Sig Dispense Refill  . amLODipine (NORVASC) 10 MG tablet TAKE 1 TABLET (10 MG TOTAL) BY MOUTH DAILY. 90 tablet 1  . atorvastatin (LIPITOR) 40 MG tablet Take 0.5 tablets (20 mg total) by mouth daily. 90 tablet prn  . bisoprolol (ZEBETA) 10 MG tablet TAKE 1 TABLET (10 MG TOTAL) BY MOUTH DAILY. 90 tablet 1  . ciclesonide (OMNARIS) 50 MCG/ACT nasal spray Place 2 sprays into both nostrils daily as needed for allergies.     . clotrimazole-betamethasone (LOTRISONE) cream Apply 3 times a day as needed 15 g 11  . ezetimibe (ZETIA) 10 MG tablet Take 1 tablet (10 mg total) by mouth daily. for cholesterol 30 tablet 3  . fluconazole (DIFLUCAN) 150 MG tablet Take 1 tab by mouth once a week 4 tablet 11  . Insulin Detemir (LEVEMIR FLEXTOUCH) 100 UNIT/ML Pen START WITH 10 UNITS AND TITRATE AS DIRECTED 15 pen 5  . Insulin Pen Needle 31G X 5 MM MISC 1 qd 100 each 6  . Insulin Syringe-Needle U-100 31G X 15/64" 0.3 ML MISC Use as directed 100 each 3  . tadalafil (CIALIS) 20 MG tablet Take 1/2 to 1 tablet every 2-3 days as needed (Patient taking  differently: Take 10-20 mg by mouth daily as needed for erectile dysfunction. Take 1/2 to 1 tablet every 2-3 days as needed) 4 tablet 1  . Vitamin D, Ergocalciferol, (DRISDOL) 50000 UNITS CAPS capsule TAKE ONE CAPSULE EVERY DAY OR AS DIRECTED 30 capsule 3   No current facility-administered medications on file prior to visit.   Medical History:  Past Medical History  Diagnosis Date  . Hypertension   . Hyperlipidemia   . Diabetes mellitus ([place])   . Hypogonadism male   . Chronic sinusitis   . Allergy   . Allergic rhinitis    Allergies:  Allergies  Allergen Reactions  . Ppd  [Tuberculin Purified Protein Derivative] Other (See Comments)    POSITIVE REACTION IN PAST 11/21/11 CXR NEG     Review of Systems:  Review of Systems  Constitutional: Negative for fever, chills and malaise/fatigue.  HENT: Negative for congestion, ear pain and sore throat.   Eyes: Negative.   Respiratory: Negative for cough, shortness of breath and wheezing.   Cardiovascular: Negative for chest pain, palpitations and leg swelling.  Gastrointestinal: Negative for heartburn, diarrhea, constipation, blood in stool and melena.  Genitourinary: Positive for hematuria. Negative for dysuria, urgency and frequency.  Skin: Negative.   Neurological: Negative for dizziness, sensory change, loss of consciousness and headaches.  Psychiatric/Behavioral: Negative for depression. The patient is not nervous/anxious and does not have insomnia.     Family history- Review and unchanged  Social history- Review and unchanged  Physical Exam: BP 128/76 mmHg  Pulse 76  Temp(Src) 98 F (36.7 C) (Temporal)  Resp 18  Ht 5\' 9"  (1.753 m)  Wt 245 lb (111.131 kg)  BMI 36.16 kg/m2 Wt Readings from Last 3 Encounters:  10/25/15 245 lb (111.131 kg)  09/01/15 248 lb (112.492 kg)  08/22/15 236 lb (107.049 kg)   General Appearance: Well nourished well developed, non-toxic appearing, in no apparent distress. Eyes: PERRLA, EOMs, conjunctiva no swelling or erythema ENT/Mouth: Ear canals clear with no erythema, swelling, or discharge.  TMs normal bilaterally, oropharynx clear, moist, with no exudate.   Neck: Supple, thyroid normal, no JVD, no cervical adenopathy.  Respiratory: Respiratory effort normal, breath sounds clear A&P, no wheeze, rhonchi or rales noted.  No retractions, no accessory muscle usage Cardio: RRR with no MRGs. No noted edema.  Abdomen: Soft, + BS.  Non tender, no guarding, rebound, hernias, masses. Musculoskeletal: Full ROM, 5/5 strength, Normal gait Skin: Warm, dry without rashes, lesions,  ecchymosis.  Neuro: Awake and oriented X 3, Cranial nerves intact. No cerebellar symptoms.  Psych: normal affect, Insight and Judgment appropriate.    Starlyn Skeans, PA-C 9:47 AM [facility]

## 2015-11-10 ENCOUNTER — Other Ambulatory Visit: Payer: Self-pay | Admitting: Internal Medicine

## 2015-11-11 ENCOUNTER — Ambulatory Visit: Payer: Self-pay

## 2015-11-12 ENCOUNTER — Other Ambulatory Visit: Payer: Self-pay | Admitting: Internal Medicine

## 2015-11-22 ENCOUNTER — Encounter: Payer: Self-pay | Admitting: Internal Medicine

## 2015-11-22 ENCOUNTER — Ambulatory Visit (INDEPENDENT_AMBULATORY_CARE_PROVIDER_SITE_OTHER): Payer: BLUE CROSS/BLUE SHIELD | Admitting: Internal Medicine

## 2015-11-22 VITALS — BP 128/84 | HR 80 | Temp 98.0°F | Resp 18 | Ht 69.0 in | Wt 236.0 lb

## 2015-11-22 DIAGNOSIS — N481 Balanitis: Secondary | ICD-10-CM | POA: Diagnosis not present

## 2015-11-22 DIAGNOSIS — R300 Dysuria: Secondary | ICD-10-CM | POA: Diagnosis not present

## 2015-11-22 MED ORDER — FLUCONAZOLE 150 MG PO TABS: ORAL_TABLET | ORAL | Status: DC

## 2015-11-22 NOTE — Progress Notes (Signed)
   Subjective:    Patient ID: Russell [name], male    DOB: 18-Nov-1961, 54 y.o.   MRN: [id number]  HPI  Patient presents to the office for evaluation of pain at the tip of the penis.  He reports that he has been having some pain with urinating and he reports that it has been uncomfortable after urinating.  He reports that the urine is foul smelling and he also reports that the urine is darker than normal.  He reports that he has been having some pain since Sunday.  It is about the same as since it started.  He reports that he was drunk and did urinate on himself and cleaned himself up after the fact, but he thinks that this is what started all of his pain.    Review of Systems  Constitutional: Negative for fever, chills and fatigue.  Gastrointestinal: Negative for nausea, vomiting and abdominal pain.  Genitourinary: Positive for dysuria, urgency, frequency and penile pain. Negative for hematuria and discharge.       Objective:   Physical Exam  Constitutional: He is oriented to person, place, and time. He appears well-developed and well-nourished. No distress.  HENT:  Head: Normocephalic.  Mouth/Throat: Oropharynx is clear and moist. No oropharyngeal exudate.  Eyes: Conjunctivae are normal. No scleral icterus.  Neck: Normal range of motion. Neck supple. No JVD present. No thyromegaly present.  Cardiovascular: Normal rate, regular rhythm, normal heart sounds and intact distal pulses.  Exam reveals no gallop.   No murmur heard. Pulmonary/Chest: Effort normal and breath sounds normal. No respiratory distress. He has no wheezes. He has no rales. He exhibits no tenderness.  Abdominal: Soft. Bowel sounds are normal. He exhibits no distension and no mass. There is no tenderness. There is no rebound and no guarding.  Genitourinary: Testes normal. Circumcised. Penile erythema and penile tenderness present. No phimosis, paraphimosis or hypospadias. No discharge found.  Penis with redness and erythema  near the meatus without discharge.  Tender to palpation at the tip.    Musculoskeletal: Normal range of motion.  Lymphadenopathy:    He has no cervical adenopathy.  Neurological: He is alert and oriented to person, place, and time.  Skin: Skin is warm and dry. He is not diaphoretic.  Psychiatric: He has a normal mood and affect. His behavior is normal. Judgment and thought content normal.  Nursing note and vitals reviewed.   Filed Vitals:   11/22/15 0950  BP: 128/84  Pulse: 80  Temp: 98 F (36.7 C)  Resp: 18         Assessment & Plan:    1. Dysuria  - fluconazole (DIFLUCAN) 150 MG tablet; Take 1 tab PO daily with food  Dispense: 7 tablet; Refill: 11 - Urinalysis, Routine w reflex microscopic (not at [facility]) - Culture, Urine  2. Balanitis -likely yeast infection given story -will send in abx if positive culture

## 2015-11-22 NOTE — Patient Instructions (Addendum)
Please use the cream that you have at home twice daily on the irritated skin.  Please take diflucan once daily for a week. While you are taking this please do not take your cholesterol medication.  You can resume your cholesterol medication once you finish the diflucan.    I will call in an antibiotic tomorrow if I see any evidence of a bacterial infection in your urine.    Please try to drink plenty of water.  Avoid alcohol and caffeine.    Balanitis Balanitis is inflammation of the head of the penis (glans).  CAUSES  Balanitis has multiple causes, both infectious and noninfectious. Often balanitis is the result of poor personal hygiene, especially in uncircumcised males. Without adequate washing, viruses, bacteria, and yeast collect between the foreskin and the glans. This can cause an infection. Lack of air and irritation from a normal secretion called smegma contribute to the cause in uncircumcised males. Other causes include:  Chemical irritation from the use of certain soaps and shower gels (especially soaps with perfumes), condoms, personal lubricants, petroleum jelly, spermicides, and fabric conditioners.  Skin conditions, such as eczema, dermatitis, and psoriasis.  Allergies to drugs, such as tetracycline and sulfa.  Certain medical conditions, including liver cirrhosis, congestive heart failure, and kidney disease.  Morbid obesity. RISK FACTORS  Diabetes mellitus.  A tight foreskin that is difficult to pull back past the glans (phimosis).  Sex without the use of a condom. SIGNS AND SYMPTOMS  Symptoms may include:  Discharge coming from under the foreskin.  Tenderness.  Itching and inability to get an erection (because of the pain).  Redness and a rash.  Sores on the glans and on the foreskin. DIAGNOSIS Diagnosis of balanitis is confirmed through a physical exam. TREATMENT The treatment is based on the cause of the balanitis. Treatment may include:  Frequent  cleansing.  Keeping the glans and foreskin dry.  Use of medicines such as creams, pain medicines, antibiotics, or medicines to treat fungal infections.  Sitz baths. If the irritation has caused a scar on the foreskin that prevents easy retraction, a circumcision may be recommended.  HOME CARE INSTRUCTIONS  Sex should be avoided until the condition has cleared. MAKE SURE YOU:  Understand these instructions.  Will watch your condition.  Will get help right away if you are not doing well or get worse.   This information is not intended to replace advice given to you by your health care provider. Make sure you discuss any questions you have with your health care provider.   Document Released: 04/14/2009 Document Revised: 12/01/2013 Document Reviewed: 05/18/2013 Elsevier Interactive Patient Education [employer].

## 2015-11-23 ENCOUNTER — Other Ambulatory Visit: Payer: Self-pay | Admitting: Internal Medicine

## 2015-11-23 LAB — URINALYSIS, ROUTINE W REFLEX MICROSCOPIC
Bilirubin Urine: NEGATIVE
Ketones, ur: NEGATIVE
Nitrite: NEGATIVE
Protein, ur: NEGATIVE
Specific Gravity, Urine: 1.031 (ref 1.001–1.035)
pH: 5.5 (ref 5.0–8.0)

## 2015-11-23 LAB — URINALYSIS, MICROSCOPIC ONLY
Casts: NONE SEEN [LPF]
Crystals: NONE SEEN [HPF]
RBC / HPF: >60 RBC/HPF — AB (ref <=2)
Squamous Epithelial / LPF: NONE SEEN [HPF] (ref <=5)
WBC, UA: >60 WBC/HPF — AB (ref <=5)
Yeast: NONE SEEN [HPF]

## 2015-11-23 MED ORDER — CIPROFLOXACIN HCL 500 MG PO TABS: 500.0000 mg | ORAL_TABLET | Freq: Two times a day (BID) | ORAL | Status: AC

## 2015-11-24 LAB — URINE CULTURE: Colony Count: >=100000

## 2015-11-25 ENCOUNTER — Ambulatory Visit (INDEPENDENT_AMBULATORY_CARE_PROVIDER_SITE_OTHER): Payer: BLUE CROSS/BLUE SHIELD

## 2015-11-25 DIAGNOSIS — E291 Testicular hypofunction: Secondary | ICD-10-CM | POA: Diagnosis not present

## 2015-11-25 MED ORDER — TESTOSTERONE CYPIONATE 200 MG/ML IM SOLN
400.0000 mg | Freq: Once | INTRAMUSCULAR | Status: AC
  Administered 2015-11-25: 400 mg via INTRAMUSCULAR

## 2015-11-25 NOTE — Progress Notes (Signed)
Pt presents for testosterone injection. Given IM in right glut.2cc  Pt tolerated injection well.

## 2015-12-09 ENCOUNTER — Ambulatory Visit (INDEPENDENT_AMBULATORY_CARE_PROVIDER_SITE_OTHER): Payer: BLUE CROSS/BLUE SHIELD

## 2015-12-09 DIAGNOSIS — R030 Elevated blood-pressure reading, without diagnosis of hypertension: Secondary | ICD-10-CM | POA: Diagnosis not present

## 2015-12-09 DIAGNOSIS — E291 Testicular hypofunction: Secondary | ICD-10-CM

## 2015-12-09 DIAGNOSIS — R300 Dysuria: Secondary | ICD-10-CM

## 2015-12-09 LAB — URINALYSIS, ROUTINE W REFLEX MICROSCOPIC
Bilirubin Urine: NEGATIVE
Hgb urine dipstick: NEGATIVE
Leukocytes, UA: NEGATIVE
Nitrite: NEGATIVE
Protein, ur: NEGATIVE
Specific Gravity, Urine: 1.031 (ref 1.001–1.035)
pH: 5.5 (ref 5.0–8.0)

## 2015-12-09 LAB — URINALYSIS, MICROSCOPIC ONLY
Bacteria, UA: NONE SEEN [HPF]
Casts: NONE SEEN [LPF]
Crystals: NONE SEEN [HPF]
RBC / HPF: NONE SEEN RBC/HPF (ref <=2)
Squamous Epithelial / LPF: NONE SEEN [HPF] (ref <=5)
WBC, UA: NONE SEEN WBC/HPF (ref <=5)
Yeast: NONE SEEN [HPF]

## 2015-12-09 MED ORDER — TESTOSTERONE CYPIONATE 200 MG/ML IM SOLN
400.0000 mg | Freq: Once | INTRAMUSCULAR | Status: AC
  Administered 2015-12-09: 400 mg via INTRAMUSCULAR

## 2015-12-09 NOTE — Progress Notes (Signed)
Expand All Collapse All   Pt presents for testosterone injection. Given IM in left glute.2cc  Pt tolerated injection well.

## 2015-12-10 LAB — URINE CULTURE
Colony Count: NO GROWTH
Organism ID, Bacteria: NO GROWTH

## 2015-12-19 ENCOUNTER — Other Ambulatory Visit: Payer: Self-pay | Admitting: Occupational Medicine

## 2015-12-19 ENCOUNTER — Ambulatory Visit: Payer: Self-pay

## 2015-12-19 DIAGNOSIS — M25512 Pain in left shoulder: Secondary | ICD-10-CM

## 2015-12-23 ENCOUNTER — Ambulatory Visit (INDEPENDENT_AMBULATORY_CARE_PROVIDER_SITE_OTHER): Payer: BLUE CROSS/BLUE SHIELD

## 2015-12-23 DIAGNOSIS — E291 Testicular hypofunction: Secondary | ICD-10-CM

## 2015-12-23 MED ORDER — TESTOSTERONE CYPIONATE 200 MG/ML IM SOLN
400.0000 mg | Freq: Once | INTRAMUSCULAR | Status: AC
  Administered 2016-01-06: 400 mg via INTRAMUSCULAR

## 2016-01-06 ENCOUNTER — Ambulatory Visit (INDEPENDENT_AMBULATORY_CARE_PROVIDER_SITE_OTHER): Payer: BLUE CROSS/BLUE SHIELD

## 2016-01-06 DIAGNOSIS — E291 Testicular hypofunction: Secondary | ICD-10-CM

## 2016-01-06 NOTE — Progress Notes (Signed)
Pt presents for Testosterone given IM in left glute. Will need to return in 2wks for next injection.

## 2016-01-19 ENCOUNTER — Other Ambulatory Visit: Payer: Self-pay | Admitting: Internal Medicine

## 2016-01-19 DIAGNOSIS — N182 Chronic kidney disease, stage 2 (mild): Secondary | ICD-10-CM

## 2016-01-19 DIAGNOSIS — E1122 Type 2 diabetes mellitus with diabetic chronic kidney disease: Secondary | ICD-10-CM

## 2016-01-19 DIAGNOSIS — Z794 Long term (current) use of insulin: Secondary | ICD-10-CM

## 2016-01-19 MED ORDER — METFORMIN HCL ER 500 MG PO TB24: ORAL_TABLET | ORAL | Status: DC

## 2016-01-20 ENCOUNTER — Ambulatory Visit (INDEPENDENT_AMBULATORY_CARE_PROVIDER_SITE_OTHER): Payer: BLUE CROSS/BLUE SHIELD

## 2016-01-20 DIAGNOSIS — E291 Testicular hypofunction: Secondary | ICD-10-CM | POA: Diagnosis not present

## 2016-01-20 MED ORDER — TESTOSTERONE CYPIONATE 200 MG/ML IM SOLN
400.0000 mg | Freq: Once | INTRAMUSCULAR | Status: AC
  Administered 2016-01-20: 400 mg via INTRAMUSCULAR

## 2016-01-25 ENCOUNTER — Encounter: Payer: Self-pay | Admitting: Internal Medicine

## 2016-02-01 ENCOUNTER — Encounter: Payer: Self-pay | Admitting: Internal Medicine

## 2016-02-01 ENCOUNTER — Other Ambulatory Visit: Payer: Self-pay | Admitting: *Deleted

## 2016-02-01 ENCOUNTER — Ambulatory Visit (INDEPENDENT_AMBULATORY_CARE_PROVIDER_SITE_OTHER): Payer: BLUE CROSS/BLUE SHIELD | Admitting: Internal Medicine

## 2016-02-01 VITALS — BP 124/80 | HR 80 | Temp 97.3°F | Resp 16 | Ht 69.0 in | Wt 234.0 lb

## 2016-02-01 DIAGNOSIS — E559 Vitamin D deficiency, unspecified: Secondary | ICD-10-CM

## 2016-02-01 DIAGNOSIS — Z794 Long term (current) use of insulin: Secondary | ICD-10-CM

## 2016-02-01 DIAGNOSIS — I10 Essential (primary) hypertension: Secondary | ICD-10-CM

## 2016-02-01 DIAGNOSIS — E119 Type 2 diabetes mellitus without complications: Secondary | ICD-10-CM

## 2016-02-01 DIAGNOSIS — E785 Hyperlipidemia, unspecified: Secondary | ICD-10-CM | POA: Diagnosis not present

## 2016-02-01 DIAGNOSIS — E291 Testicular hypofunction: Secondary | ICD-10-CM | POA: Diagnosis not present

## 2016-02-01 DIAGNOSIS — Z91199 Patient's noncompliance with other medical treatment and regimen due to unspecified reason: Secondary | ICD-10-CM

## 2016-02-01 DIAGNOSIS — Z79899 Other long term (current) drug therapy: Secondary | ICD-10-CM | POA: Diagnosis not present

## 2016-02-01 DIAGNOSIS — Z9119 Patient's noncompliance with other medical treatment and regimen: Secondary | ICD-10-CM

## 2016-02-01 DIAGNOSIS — E349 Endocrine disorder, unspecified: Secondary | ICD-10-CM

## 2016-02-01 LAB — BASIC METABOLIC PANEL WITH GFR
BUN: 15 mg/dL (ref 7–25)
CO2: 26 mmol/L (ref 20–31)
Calcium: 9.7 mg/dL (ref 8.6–10.3)
Chloride: 100 mmol/L (ref 98–110)
Creat: 1.1 mg/dL (ref 0.70–1.33)
GFR, Est African American: 88 mL/min (ref >=60)
GFR, Est Non African American: 76 mL/min (ref >=60)
Glucose, Bld: 195 mg/dL — ABNORMAL HIGH (ref 65–99)
Potassium: 4.5 mmol/L (ref 3.5–5.3)
Sodium: 136 mmol/L (ref 135–146)

## 2016-02-01 LAB — CBC WITH DIFFERENTIAL/PLATELET
Basophils Absolute: 0 10*3/uL (ref 0.0–0.1)
Basophils Relative: 0 % (ref 0–1)
Eosinophils Absolute: 0.2 10*3/uL (ref 0.0–0.7)
Eosinophils Relative: 2 % (ref 0–5)
HCT: 50.5 % (ref 39.0–52.0)
Hemoglobin: 17.4 g/dL — ABNORMAL HIGH (ref 13.0–17.0)
Lymphocytes Relative: 23 % (ref 12–46)
Lymphs Abs: 1.9 10*3/uL (ref 0.7–4.0)
MCH: 29 pg (ref 26.0–34.0)
MCHC: 34.5 g/dL (ref 30.0–36.0)
MCV: 84 fL (ref 78.0–100.0)
MPV: 9.3 fL (ref 8.6–12.4)
Monocytes Absolute: 1.2 10*3/uL — ABNORMAL HIGH (ref 0.1–1.0)
Monocytes Relative: 14 % — ABNORMAL HIGH (ref 3–12)
Neutro Abs: 5.1 10*3/uL (ref 1.7–7.7)
Neutrophils Relative %: 61 % (ref 43–77)
Platelets: 289 10*3/uL (ref 150–400)
RBC: 6.01 MIL/uL — ABNORMAL HIGH (ref 4.22–5.81)
RDW: 15.3 % (ref 11.5–15.5)
WBC: 8.4 10*3/uL (ref 4.0–10.5)

## 2016-02-01 LAB — HEMOGLOBIN A1C
Hgb A1c MFr Bld: 14.3 % — ABNORMAL HIGH (ref <5.7)
Mean Plasma Glucose: 364 mg/dL — ABNORMAL HIGH (ref <117)

## 2016-02-01 LAB — HEPATIC FUNCTION PANEL
ALT: 11 U/L (ref 9–46)
AST: 10 U/L (ref 10–35)
Albumin: 4.1 g/dL (ref 3.6–5.1)
Alkaline Phosphatase: 61 U/L (ref 40–115)
Bilirubin, Direct: 0.3 mg/dL — ABNORMAL HIGH (ref <=0.2)
Indirect Bilirubin: 1 mg/dL (ref 0.2–1.2)
Total Bilirubin: 1.3 mg/dL — ABNORMAL HIGH (ref 0.2–1.2)
Total Protein: 7 g/dL (ref 6.1–8.1)

## 2016-02-01 LAB — LIPID PANEL
Cholesterol: 101 mg/dL — ABNORMAL LOW (ref 125–200)
HDL: 33 mg/dL — ABNORMAL LOW (ref >=40)
LDL Cholesterol: 56 mg/dL (ref <130)
Total CHOL/HDL Ratio: 3.1 Ratio (ref <=5.0)
Triglycerides: 58 mg/dL (ref <150)
VLDL: 12 mg/dL (ref <30)

## 2016-02-01 LAB — MAGNESIUM: Magnesium: 1.6 mg/dL (ref 1.5–2.5)

## 2016-02-01 MED ORDER — VITAMIN D (ERGOCALCIFEROL) 1.25 MG (50000 UNIT) PO CAPS: ORAL_CAPSULE | ORAL | Status: DC

## 2016-02-01 MED ORDER — TESTOSTERONE CYPIONATE 200 MG/ML IM SOLN
400.0000 mg | Freq: Once | INTRAMUSCULAR | Status: AC
  Administered 2016-02-01: 400 mg via INTRAMUSCULAR

## 2016-02-01 MED ORDER — TESTOSTERONE CYPIONATE 200 MG/ML IM SOLN: 400.0000 mg | INTRAMUSCULAR | Status: DC

## 2016-02-01 NOTE — Patient Instructions (Signed)

## 2016-02-01 NOTE — Progress Notes (Signed)
Patient ID: Russell [name], male   DOB: Jan 18, 1961, 55 y.o.   MRN: [id number]   This very nice 55 y.o. MBM  presents for 3 month follow up with Hypertension, Hyperlipidemia, [name] and [name]. Today patient relates that he slipped on ice and was referred by Dr Burney Gauze to Dr Ronnie Derby and then referred to Dr Jeannette Corpus at [name] for evaluation [name] of L shoulder Rotator cuff injury.    Patient is treated for HTN since 2000 & BP has been controlled at home. Today's BP: 124/80 mmHg. Patient has had no complaints of any cardiac type chest pain, palpitations, dyspnea/orthopnea/PND, dizziness, claudication, or dependent edema.   Hyperlipidemia is controlled with diet & meds. Patient denies myalgias or other med SE's. Last Lipids were at goal with Cholesterol 105*; HDL 13*; LDL 61; Triglycerides 156 on 07/20/2015.   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 Patient relates that his CBG's have been elevated due to steroid injections and that he has had to increase his Levemir from 18 up to 35 units for control of his hyperglycemia.  on the Last A1c was 9.5% on 07/20/2015.    Further, the patient also has history of [name]  Of "28" in 2008 and supplements vitamin D without any suspected side-effects. Last vitamin D was 77 on 07/20/2015.    Medication Sig  . amLODipine  10 MG tablet TAKE 1 TABLET (10 MG TOTAL) BY MOUTH DAILY.  Marland Kitchen atorvastatin  40 MG tablet TAKE 1/2 TABLET BY MOUTH DAILY  . bisoprolol  10 MG tablet TAKE 1 TABLET (10 MG TOTAL) BY MOUTH DAILY.  Marland Kitchen OMNARIS nasal spray Place 2 sprays into both nostrils daily as needed for allergies.   Marland Kitchen LOTRISONE cream Apply 3 times a day as needed  . LEVEMIR FLEXTOUCH START WITH 10 UNITS AND TITRATE AS DIRECTED  . metFORMIN-XR) 500 MG - Take  2 tablets 2 x day for Diabetes  . tadalafil (CIALIS) 20 MG tablet  Take 1/2 to 1 tablet every 2-3 days as needed)  . testosterone cypionate 200 MG/ML inj Inject 2 mLs (400 mg total) into the muscle once.  . Vitamin D 50,000 UNITS  TAKE ONE CAPSULE EVERY DAY OR AS DIRECTED  . ZETIA 10 MG TAKE 1 TABLET BY MOUTH DAILY FOR CHOLESTEROL   Allergies  Allergen Reactions  . Ppd [Tuberculin Purified Protein Derivative] Other (See Comments)    POSITIVE REACTION IN PAST 11/21/11 CXR NEG   PMHx:   Past Medical History  Diagnosis Date  . Hypertension   . Hyperlipidemia   . [name]   . Hypogonadism male   . Chronic sinusitis   . Allergy   . Allergic rhinitis    Immunization History  Administered Date(s) Administered  . Influenza Split 10/10/2013  . Influenza Whole 09/08/2012  . Pneumococcal-Unspecified 12/31/2011  . Tdap 12/19/2009   Past Surgical History  Procedure Laterality Date  . Wisdom tooth extraction    . Hernia repair     FHx:    Reviewed / unchanged  SHx:    Reviewed / unchanged  Systems Review:  Constitutional: Denies fever, chills, wt changes, headaches, insomnia, fatigue, night sweats, change in appetite. Eyes: Denies redness, blurred vision, diplopia, discharge, itchy, watery eyes.  ENT: Denies discharge, congestion, post nasal drip, epistaxis, sore throat,  earache, hearing loss, dental pain, tinnitus, vertigo, sinus pain, snoring.  CV: Denies chest pain, palpitations, irregular heartbeat, syncope, dyspnea, diaphoresis, orthopnea, PND, claudication or edema. Respiratory: denies cough, dyspnea, DOE, pleurisy, hoarseness, laryngitis, wheezing.  Gastrointestinal: Denies dysphagia, odynophagia, heartburn, reflux, water brash, abdominal pain or cramps, nausea, vomiting, bloating, diarrhea, constipation, hematemesis, melena, hematochezia  or hemorrhoids. Genitourinary: Denies dysuria, frequency, urgency, nocturia, hesitancy, discharge, hematuria or flank  pain. Musculoskeletal: Denies arthralgias, myalgias, stiffness, jt. swelling, pain, limping or strain/sprain.  Skin: Denies pruritus, rash, hives, warts, acne, eczema or change in skin lesion(s). Neuro: No weakness, tremor, incoordination, spasms, paresthesia or pain. Psychiatric: Denies confusion, memory loss or sensory loss. Endo: Denies change in weight, skin or hair change.  Heme/Lymph: No excessive bleeding, bruising or enlarged lymph nodes.  Physical Exam  BP 124/80 mmHg  Pulse 80  Temp(Src) 97.3 F (36.3 C)  Resp 16  Ht 5\' 9"  (1.753 m)  Wt 234 lb (106.142 kg)  BMI 34.54 kg/m2  Appears well nourished and in no distress. Eyes: PERRLA, EOMs, conjunctiva no swelling or erythema. Sinuses: No frontal/maxillary tenderness ENT/Mouth: EAC's clear, TM's nl w/o erythema, bulging. Nares clear w/o erythema, swelling, exudates. Oropharynx clear without erythema or exudates. Oral hygiene is good. Tongue normal, non obstructing. Hearing intact.  Neck: Supple. Thyroid nl. Car 2+/2+ without bruits, nodes or JVD. Chest: Respirations nl with BS clear & equal w/o rales, rhonchi, wheezing or stridor.  Cor: Heart sounds normal w/ regular rate and rhythm without sig. murmurs, gallops, clicks, or rubs. Peripheral pulses normal and equal  without edema.  Abdomen: Soft & bowel sounds normal. Non-tender w/o guarding, rebound, hernias, masses, or organomegaly.  Lymphatics: Unremarkable.  Musculoskeletal: Full ROM all peripheral extremities, joint stability, 5/5 strength, and normal gait.  Skin: Warm, dry without exposed rashes, lesions or ecchymosis apparent.  Neuro: Cranial nerves intact, reflexes equal bilaterally. Sensory-motor testing grossly intact. Tendon reflexes grossly intact.  Pysch: Alert & oriented x 3.  Insight and judgement nl & appropriate. No ideations.  Assessment and Plan:  1. Essential hypertension  - TSH  2. Hyperlipidemia  - Lipid panel - TSH  3. Insulin-requiring or  dependent type [name] diabetes mellitus ([place])  - Hemoglobin A1c  4. [name]  - VITAMIN D 25 Hydroxy  5. Testosterone deficiency  - Testosterone - testosterone cypionate (DEPOTESTOSTERONE CYPIONATE) injection 400 mg; Inject 2 mLs (400 mg total) into the muscle once.  6. Morbid obesity, unspecified obesity type ([place])  7. Poor compliance with diet   8. Medication management  - CBC with Differential/Platelet - BASIC METABOLIC PANEL WITH GFR - Hepatic function panel - Magnesium   Recommended regular exercise, BP monitoring, weight control, and discussed med and SE's. Recommended labs to assess and monitor clinical status. Further disposition pending results of labs. Over 30 minutes of exam, counseling, chart review was performed

## 2016-02-02 LAB — VITAMIN D 25 HYDROXY (VIT D DEFICIENCY, FRACTURES): Vit D, 25-Hydroxy: >150 ng/mL — ABNORMAL HIGH (ref 30–100)

## 2016-02-02 LAB — TSH: TSH: 0.96 mIU/L (ref 0.40–4.50)

## 2016-02-02 LAB — TESTOSTERONE: Testosterone: 320 ng/dL (ref 250–827)

## 2016-02-03 ENCOUNTER — Ambulatory Visit: Payer: Self-pay

## 2016-02-17 ENCOUNTER — Ambulatory Visit (INDEPENDENT_AMBULATORY_CARE_PROVIDER_SITE_OTHER): Payer: BLUE CROSS/BLUE SHIELD

## 2016-03-02 ENCOUNTER — Ambulatory Visit (INDEPENDENT_AMBULATORY_CARE_PROVIDER_SITE_OTHER): Payer: BLUE CROSS/BLUE SHIELD

## 2016-03-02 DIAGNOSIS — E291 Testicular hypofunction: Secondary | ICD-10-CM

## 2016-03-02 MED ORDER — TESTOSTERONE CYPIONATE 200 MG/ML IM SOLN
400.0000 mg | Freq: Once | INTRAMUSCULAR | Status: AC
  Administered 2016-03-02: 400 mg via INTRAMUSCULAR

## 2016-03-02 NOTE — Progress Notes (Signed)
Pt presents for testosterone injection 70mL in right glute. Pt had no question or concerns at this time. Pt will return in 2wks for next injection.

## 2016-03-14 DIAGNOSIS — R310 Gross hematuria: Secondary | ICD-10-CM | POA: Diagnosis not present

## 2016-03-14 DIAGNOSIS — R972 Elevated prostate specific antigen [PSA]: Secondary | ICD-10-CM | POA: Diagnosis not present

## 2016-03-14 DIAGNOSIS — Z0000 Encounter for general adult medical examination without abnormal findings: Secondary | ICD-10-CM | POA: Diagnosis not present

## 2016-03-16 ENCOUNTER — Ambulatory Visit (INDEPENDENT_AMBULATORY_CARE_PROVIDER_SITE_OTHER): Payer: BLUE CROSS/BLUE SHIELD | Admitting: *Deleted

## 2016-03-16 DIAGNOSIS — E291 Testicular hypofunction: Secondary | ICD-10-CM | POA: Diagnosis not present

## 2016-03-16 MED ORDER — TESTOSTERONE CYPIONATE 200 MG/ML IM SOLN
400.0000 mg | Freq: Once | INTRAMUSCULAR | Status: AC
  Administered 2016-03-16: 400 mg via INTRAMUSCULAR

## 2016-03-16 NOTE — Progress Notes (Signed)
Patient ID: Russell [name], male   DOB: 11-30-1961, 55 y.o.   MRN: [id number] Patient here for NV to administer Testosterone Cypionate 200 mg/ml 2 ml IM in left upper outer quadrant.  Patient tolerated well.

## 2016-03-30 ENCOUNTER — Ambulatory Visit (INDEPENDENT_AMBULATORY_CARE_PROVIDER_SITE_OTHER): Payer: BLUE CROSS/BLUE SHIELD | Admitting: *Deleted

## 2016-03-30 DIAGNOSIS — E291 Testicular hypofunction: Secondary | ICD-10-CM | POA: Diagnosis not present

## 2016-03-30 MED ORDER — TESTOSTERONE CYPIONATE 200 MG/ML IM SOLN
400.0000 mg | Freq: Once | INTRAMUSCULAR | Status: AC
  Administered 2016-03-30: 400 mg via INTRAMUSCULAR

## 2016-03-30 NOTE — Progress Notes (Signed)
Patient ID: Russell [name], male   DOB: Jul 03, 1961, 55 y.o.   MRN: [id number] Patient presents for testosterone injection for hypogonadism Tx.  Patient received 2 cc IM right glute and tolerated well.  Will RTC for next injection AD.

## 2016-04-07 ENCOUNTER — Other Ambulatory Visit: Payer: Self-pay | Admitting: Internal Medicine

## 2016-04-09 ENCOUNTER — Other Ambulatory Visit: Payer: Self-pay | Admitting: Internal Medicine

## 2016-04-13 ENCOUNTER — Ambulatory Visit (INDEPENDENT_AMBULATORY_CARE_PROVIDER_SITE_OTHER): Payer: BLUE CROSS/BLUE SHIELD

## 2016-04-13 DIAGNOSIS — E291 Testicular hypofunction: Secondary | ICD-10-CM

## 2016-04-13 MED ORDER — TESTOSTERONE CYPIONATE 200 MG/ML IM SOLN
400.0000 mg | Freq: Once | INTRAMUSCULAR | Status: AC
  Administered 2016-04-13: 400 mg via INTRAMUSCULAR

## 2016-04-13 NOTE — Progress Notes (Signed)
Patient presents for testosterone injection for hypogonadism Tx. Patient received 2 cc IM LEFT glut and tolerated well. Will RTC for next injection

## 2016-04-20 ENCOUNTER — Ambulatory Visit: Payer: Self-pay

## 2016-04-21 ENCOUNTER — Other Ambulatory Visit: Payer: Self-pay | Admitting: Internal Medicine

## 2016-04-24 ENCOUNTER — Other Ambulatory Visit: Payer: Self-pay | Admitting: *Deleted

## 2016-04-24 MED ORDER — BISOPROLOL FUMARATE 10 MG PO TABS: ORAL_TABLET | ORAL | Status: DC

## 2016-04-24 MED ORDER — VITAMIN D (ERGOCALCIFEROL) 1.25 MG (50000 UNIT) PO CAPS: ORAL_CAPSULE | ORAL | Status: DC

## 2016-04-30 ENCOUNTER — Encounter: Payer: Self-pay | Admitting: Physician Assistant

## 2016-04-30 ENCOUNTER — Ambulatory Visit (INDEPENDENT_AMBULATORY_CARE_PROVIDER_SITE_OTHER): Payer: BLUE CROSS/BLUE SHIELD | Admitting: Physician Assistant

## 2016-04-30 ENCOUNTER — Other Ambulatory Visit: Payer: Self-pay | Admitting: Physician Assistant

## 2016-04-30 VITALS — BP 134/82 | HR 62 | Temp 97.5°F | Resp 16 | Ht 69.0 in | Wt 236.6 lb

## 2016-04-30 DIAGNOSIS — E785 Hyperlipidemia, unspecified: Secondary | ICD-10-CM | POA: Diagnosis not present

## 2016-04-30 DIAGNOSIS — E291 Testicular hypofunction: Secondary | ICD-10-CM

## 2016-04-30 DIAGNOSIS — I10 Essential (primary) hypertension: Secondary | ICD-10-CM

## 2016-04-30 DIAGNOSIS — Z79899 Other long term (current) drug therapy: Secondary | ICD-10-CM

## 2016-04-30 DIAGNOSIS — E559 Vitamin D deficiency, unspecified: Secondary | ICD-10-CM

## 2016-04-30 DIAGNOSIS — E119 Type 2 diabetes mellitus without complications: Secondary | ICD-10-CM | POA: Diagnosis not present

## 2016-04-30 DIAGNOSIS — Z794 Long term (current) use of insulin: Secondary | ICD-10-CM

## 2016-04-30 LAB — CBC WITH DIFFERENTIAL/PLATELET
Basophils Absolute: 86 cells/uL (ref 0–200)
Basophils Relative: 2 %
Eosinophils Absolute: 172 cells/uL (ref 15–500)
Eosinophils Relative: 4 %
HCT: 55.8 % — ABNORMAL HIGH (ref 38.5–50.0)
Hemoglobin: 18.7 g/dL — ABNORMAL HIGH (ref 13.2–17.1)
Lymphocytes Relative: 42 %
Lymphs Abs: 1806 cells/uL (ref 850–3900)
MCH: 28.9 pg (ref 27.0–33.0)
MCHC: 33.5 g/dL (ref 32.0–36.0)
MCV: 86.2 fL (ref 80.0–100.0)
MPV: 9.8 fL (ref 7.5–12.5)
Monocytes Absolute: 516 cells/uL (ref 200–950)
Monocytes Relative: 12 %
Neutro Abs: 1720 cells/uL (ref 1500–7800)
Neutrophils Relative %: 40 %
Platelets: 243 10*3/uL (ref 140–400)
RBC: 6.47 MIL/uL — ABNORMAL HIGH (ref 4.20–5.80)
RDW: 14.1 % (ref 11.0–15.0)
WBC: 4.3 10*3/uL (ref 3.8–10.8)

## 2016-04-30 LAB — BASIC METABOLIC PANEL WITH GFR
BUN: 18 mg/dL (ref 7–25)
CO2: 26 mmol/L (ref 20–31)
Calcium: 9.8 mg/dL (ref 8.6–10.3)
Chloride: 98 mmol/L (ref 98–110)
Creat: 1.03 mg/dL (ref 0.70–1.33)
GFR, Est African American: >89 mL/min (ref >=60)
GFR, Est Non African American: 83 mL/min (ref >=60)
Glucose, Bld: 325 mg/dL — ABNORMAL HIGH (ref 65–99)
Potassium: 4.6 mmol/L (ref 3.5–5.3)
Sodium: 135 mmol/L (ref 135–146)

## 2016-04-30 LAB — TSH: TSH: 1.02 mIU/L (ref 0.40–4.50)

## 2016-04-30 LAB — LIPID PANEL
Cholesterol: 120 mg/dL — ABNORMAL LOW (ref 125–200)
HDL: 29 mg/dL — ABNORMAL LOW (ref >=40)
LDL Cholesterol: 64 mg/dL (ref <130)
Total CHOL/HDL Ratio: 4.1 Ratio (ref <=5.0)
Triglycerides: 133 mg/dL (ref <150)
VLDL: 27 mg/dL (ref <30)

## 2016-04-30 LAB — HEPATIC FUNCTION PANEL
ALT: 16 U/L (ref 9–46)
AST: 13 U/L (ref 10–35)
Albumin: 4.4 g/dL (ref 3.6–5.1)
Alkaline Phosphatase: 63 U/L (ref 40–115)
Bilirubin, Direct: 0.1 mg/dL (ref <=0.2)
Indirect Bilirubin: 0.5 mg/dL (ref 0.2–1.2)
Total Bilirubin: 0.6 mg/dL (ref 0.2–1.2)
Total Protein: 7.4 g/dL (ref 6.1–8.1)

## 2016-04-30 LAB — MAGNESIUM: Magnesium: 2 mg/dL (ref 1.5–2.5)

## 2016-04-30 MED ORDER — ATORVASTATIN CALCIUM 40 MG PO TABS: 20.0000 mg | ORAL_TABLET | Freq: Every day | ORAL | Status: DC

## 2016-04-30 MED ORDER — TESTOSTERONE CYPIONATE 200 MG/ML IM SOLN: 400.0000 mg | INTRAMUSCULAR | Status: DC

## 2016-04-30 MED ORDER — INSULIN LISPRO PROT & LISPRO (75-25 MIX) 100 UNIT/ML KWIKPEN: PEN_INJECTOR | SUBCUTANEOUS | Status: DC

## 2016-04-30 MED ORDER — INSULIN NPH ISOPHANE & REGULAR (70-30) 100 UNIT/ML ~~LOC~~ SUSP: SUBCUTANEOUS | Status: DC

## 2016-04-30 NOTE — Patient Instructions (Signed)
Stop the lantus, get the Humolog 70/30 pen  Start with 15 units WITH food in the morning and 10 units at night WITH food  The morning insulin affects the night time sugar The night insulin affects the morning sugar  If your sugar in the morning is still above 150 for 3 days, increase your NIGHT time insulin by 3 units.   If your sugar 2 hours after eating at night is above 250, then increase your morning insulin by 3 units every 3 days.   YOU HAVE TO TAKE THIS WITH FOOD!   Your A1C is a measure of your sugar over the past 3 months and is not affected by what you have eaten over the past few days. Diabetes increases your chances of stroke and heart attack over 300 % and is the leading cause of blindness and kidney failure in the [place]. Please make sure you decrease bad carbs like white bread, white rice, potatoes, corn, soft drinks, pasta, cereals, refined sugars, sweet tea, dried fruits, and fruit juice. Good carbs are okay to eat in moderation like sweet potatoes, brown rice, whole grain pasta/bread, most fruit (except dried fruit) and you can eat as many veggies as you want.   Greater than 6.5 is considered diabetic. Between 6.4 and 5.7 is prediabetic If your A1C is less than 5.7 you are NOT diabetic.  Targets for Glucose Readings: Time of Check Target for patients WITHOUT Diabetes Target for DIABETICS  Before Meals Less than 100  less than 150  Two hours after meals Less than 200  Less than 250    If your morning sugar is always below 100 then the issue is with your sugar spiking after meals. Try to take your blood sugar approximately 2 hours after eating, this number should be less than 200. If it is not, think about the foods that you ate and better choices you can make.      Bad carbs also include fruit juice, alcohol, and sweet tea. These are empty calories that do not signal to your brain that you are full.   Please remember the good carbs are still carbs which convert  into sugar. So please measure them out no more than 1/2-1 cup of rice, oatmeal, pasta, and beans  Veggies are however free foods! Pile them on.   Not all fruit is created equal. Please see the list below, the fruit at the bottom is higher in sugars than the fruit at the top. Please avoid all dried fruits.       Diabetes is a very complicated disease...lets simplify it.  An easy way to look at it to understand the complications is if you think of the extra sugar floating in your blood stream as glass shards floating through your blood stream.    Diabetes affects your small vessels first: 1) The glass shards (sugar) scraps down the tiny blood vessels in your eyes and lead to diabetic retinopathy, the leading cause of blindness in the [place]. Diabetes is the leading cause of newly diagnosed adult (61 to 54 years of age) blindness in the [place].  2) The glass shards scratches down the tiny vessels of your legs leading to nerve damage called neuropathy and can lead to amputations of your feet. More than 60% of all non-traumatic amputations of lower limbs occur in people with diabetes.  3) Over time the small vessels in your brain are shredded and closed off, individually this does not cause any problems but  over a long period of time many of the small vessels being blocked can lead to Vascular Dementia.   4) Your kidney's are a filter system and have a "net" that keeps certain things in the body and lets bad things out. Sugar shreds this net and leads to kidney damage and eventually failure. Decreasing the sugar that is destroying the net and certain blood pressure medications can help stop or decrease progression of kidney disease. Diabetes was the primary cause of kidney failure in 44 percent of all new cases in 2011.  5) Diabetes also destroys the small vessels in your penis that lead to erectile dysfunction. Eventually the vessels are so damaged that you may not be responsive to cialis or  viagra.   Diabetes and your large vessels: Your larger vessels consist of your coronary arteries in your heart and the carotid vessels to your brain. Diabetes or even increased sugars put you at 300% increased risk of heart attack and stroke and this is why.. The sugar scrapes down your large blood vessels and your body sees this as an internal injury and tries to repair itself. Just like you get a scab on your skin, your platelets will stick to the blood vessel wall trying to heal it. This is why we have diabetics on low dose aspirin daily, this prevents the platelets from sticking and can prevent plaque formation. In addition, your body takes cholesterol and tries to shove it into the open wound. This is why we want your LDL, or bad cholesterol, below 70.   The combination of platelets and cholesterol over 5-10 years forms plaque that can break off and cause a heart attack or stroke.   PLEASE REMEMBER:  Diabetes is preventable! Up to 13 percent of complications and morbidities among individuals with type 2 diabetes can be prevented, delayed, or effectively treated and minimized with regular visits to a health professional, appropriate monitoring and medication, and a healthy diet and lifestyle.  Marland Kitchen

## 2016-04-30 NOTE — Progress Notes (Signed)
Patient ID: Russell [name], male   DOB: 29-May-1961, 55 y.o.   MRN: [id number]  Assessment and Plan:  Hypertension:  -Continue medication -monitor blood pressure at home. -Continue DASH diet -Reminder to go to the ER if any CP, SOB, nausea, dizziness, severe HA, changes vision/speech, left arm numbness and tingling and jaw pain.  Cholesterol - Continue diet and exercise -Check cholesterol.   Diabetes with diabetic chronic kidney disease -Continue diet and exercise.  -Check A1C - will refer to DM educator - will stop lantus 35 units and switch to 70/30 mix twice a day Will start 15 and 10 units WITH food  Vitamin D Def -check level -continue medications.   Testosterone Def -given injection today -check level  Continue diet and meds as discussed. Further disposition pending results of labs. Discussed med's effects and SE's.    HPI 55 y.o. AA male  presents for 3 month follow up with hypertension, hyperlipidemia, diabetes and [name].  Has seen Dr. Jeffie Pollock for hematuria.    His blood pressure has been controlled at home, today their BP is BP: 134/82 mmHg.He does workout. He denies chest pain, shortness of breath, dizziness. He reports that during the week he is doing a lot more vegetables and fruits.  He does tend to have some meat on the weekends.  He reports that he has had to get a lot of clothes tailored and getting smaller sizes.    He states that the beginning of this year, he was walking out of his work building and fell and hurt his left shoulder/rotator cuff. He was suppose to have surgery May 2nd at [facility] however due to his high sugars, it was rescheduled.    He is on cholesterol medication and denies myalgias. His cholesterol is at goal. The cholesterol was:  02/01/2016: Cholesterol 101*; HDL 33*; LDL Cholesterol 56; Triglycerides 58   He has been working on diet and exercise for diabetes with diabetic chronic kidney disease, he is on bASA, he is on ACE/ARB,   He is on Metformin and lantus 35 units but still has fasting sugars in the 200's. and denies  foot ulcerations, hyperglycemia, hypoglycemia , increased appetite, nausea, paresthesia of the feet, polydipsia, polyuria, visual disturbances, vomiting and weight loss. Last A1C was: 02/01/2016: Hgb A1c MFr Bld 14.3*- his A1C at [facility] was 12.9 on 04/03/2016.    Patient is on Vitamin D supplement. 02/01/2016: Vit D, 25-Hydroxy >150*  He has a history of testosterone deficiency and is on testosterone replacement. He states that the testosterone helps with his energy, libido, muscle mass. Lab Results  Component Value Date   TESTOSTERONE 320 02/01/2016    Current Medications:  Current Outpatient Prescriptions on File Prior to Visit  Medication Sig Dispense Refill  . amLODipine (NORVASC) 10 MG tablet TAKE 1 TABLET (10 MG TOTAL) BY MOUTH DAILY. 90 tablet 1  . atorvastatin (LIPITOR) 40 MG tablet TAKE 1/2 TABLET BY MOUTH DAILY 90 tablet 1  . bisoprolol (ZEBETA) 10 MG tablet TAKE 1 TABLET (10 MG TOTAL) BY MOUTH DAILY. 90 tablet 1  . ciclesonide (OMNARIS) 50 MCG/ACT nasal spray Place 2 sprays into both nostrils daily as needed for allergies.     . clotrimazole-betamethasone (LOTRISONE) cream Apply 3 times a day as needed 15 g 11  . fluconazole (DIFLUCAN) 150 MG tablet Take 1 tab PO daily with food 7 tablet 11  . Insulin Detemir (LEVEMIR FLEXTOUCH) 100 UNIT/ML Pen START WITH 10 UNITS AND TITRATE AS DIRECTED 15  pen 5  . Insulin Pen Needle 31G X 5 MM MISC 1 qd 100 each 6  . Insulin Syringe-Needle U-100 31G X 15/64" 0.3 ML MISC Use as directed 100 each 3  . metFORMIN (GLUCOPHAGE XR) 500 MG 24 hr tablet Take 2 tablets 2 x day for Diabetes 360 tablet 1  . tadalafil (CIALIS) 20 MG tablet Take 1/2 to 1 tablet every 2-3 days as needed (Patient taking differently: Take 10-20 mg by mouth daily as needed for erectile dysfunction. Take 1/2 to 1 tablet every 2-3 days as needed) 4 tablet 1  . testosterone cypionate  (DEPOTESTOSTERONE CYPIONATE) 200 MG/ML injection Inject 2 mLs (400 mg total) into the muscle every 14 (fourteen) days. 10 mL 3  . Vitamin D, Ergocalciferol, (DRISDOL) 50000 units CAPS capsule TAKE ONE CAPSULE EVERY DAY OR AS DIRECTED 90 capsule 1  . ZETIA 10 MG tablet TAKE 1 TABLET BY MOUTH DAILY FOR CHOLESTEROL 30 tablet 3  . ZETIA 10 MG tablet TAKE 1 TABLET BY MOUTH DAILY FOR CHOLESTEROL 90 tablet 1   No current facility-administered medications on file prior to visit.   Medical History:  Past Medical History  Diagnosis Date  . Hypertension   . Hyperlipidemia   . Diabetes mellitus ([place])   . Hypogonadism male   . Chronic sinusitis   . Allergy   . Allergic rhinitis    Allergies:  Allergies  Allergen Reactions  . 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  HENT: Negative for congestion, ear pain and sore throat.   Eyes: Negative.   Respiratory: Negative for cough, shortness of breath and wheezing.   Cardiovascular: Negative for chest pain, palpitations and leg swelling.  Gastrointestinal: Negative for heartburn, diarrhea, constipation, blood in stool and melena.  Genitourinary: Negative for dysuria, urgency, frequency and hematuria.  Musculoskeletal: Positive for myalgias (shoulder pain).  Skin: Negative.   Neurological: Negative for dizziness, sensory change, loss of consciousness and headaches.  Psychiatric/Behavioral: Negative for depression. The patient is not nervous/anxious and does not have insomnia.     Family history- Review and unchanged  Social history- Review and unchanged  Physical Exam: BP 134/82 mmHg  Pulse 62  Temp(Src) 97.5 F (36.4 C) (Temporal)  Resp 16  Ht 5\' 9"  (1.753 m)  Wt 236 lb 9.6 oz (107.321 kg)  BMI 34.92 kg/m2  SpO2 95% Wt Readings from Last 3 Encounters:   04/30/16 236 lb 9.6 oz (107.321 kg)  02/01/16 234 lb (106.142 kg)  11/22/15 236 lb (107.049 kg)   General Appearance: Well nourished well developed, non-toxic appearing, in no apparent distress. Eyes: PERRLA, EOMs, conjunctiva no swelling or erythema ENT/Mouth: Ear canals clear with no erythema, swelling, or discharge.  TMs normal bilaterally, oropharynx clear, moist, with no exudate.   Neck: Supple, thyroid normal, no JVD, no cervical adenopathy.  Respiratory: Respiratory effort normal, breath sounds clear A&P, no wheeze, rhonchi or rales noted.  No retractions, no accessory muscle usage Cardio: RRR with no MRGs. No noted edema.  Abdomen: Soft, + BS.  Non tender, no guarding, rebound, hernias, masses. Musculoskeletal: Full ROM, 5/5 strength, Normal gait Skin: Warm, dry without rashes, lesions, ecchymosis.  Neuro: Awake and oriented X 3, Cranial nerves intact. No cerebellar symptoms.  Psych: normal affect, Insight and Judgment appropriate.    Vicie Mutters, PA-C 10:55 AM [facility]

## 2016-05-01 LAB — VITAMIN D 25 HYDROXY (VIT D DEFICIENCY, FRACTURES): Vit D, 25-Hydroxy: 150 ng/mL — ABNORMAL HIGH (ref 30–100)

## 2016-05-01 NOTE — Progress Notes (Signed)
RX CALLED INTO [facility]. 

## 2016-05-03 ENCOUNTER — Ambulatory Visit: Payer: Self-pay | Admitting: Physician Assistant

## 2016-05-03 ENCOUNTER — Other Ambulatory Visit: Payer: Self-pay | Admitting: Physician Assistant

## 2016-05-03 MED ORDER — "INSULIN SYRINGE-NEEDLE U-100 31G X 15/64"" 0.5 ML MISC": Status: DC

## 2016-05-03 MED ORDER — INSULIN NPH ISOPHANE & REGULAR (70-30) 100 UNIT/ML ~~LOC~~ SUSP: SUBCUTANEOUS | Status: DC

## 2016-05-09 DIAGNOSIS — E119 Type 2 diabetes mellitus without complications: Secondary | ICD-10-CM | POA: Diagnosis not present

## 2016-05-09 DIAGNOSIS — S37019A Minor contusion of unspecified kidney, initial encounter: Secondary | ICD-10-CM | POA: Diagnosis not present

## 2016-05-09 DIAGNOSIS — I10 Essential (primary) hypertension: Secondary | ICD-10-CM | POA: Diagnosis not present

## 2016-05-09 DIAGNOSIS — N179 Acute kidney failure, unspecified: Secondary | ICD-10-CM | POA: Diagnosis not present

## 2016-05-14 ENCOUNTER — Other Ambulatory Visit: Payer: Self-pay | Admitting: *Deleted

## 2016-05-14 MED ORDER — GLUCOSE BLOOD VI STRP: ORAL_STRIP | Status: DC

## 2016-05-21 ENCOUNTER — Encounter: Payer: Self-pay | Admitting: Physician Assistant

## 2016-05-21 ENCOUNTER — Ambulatory Visit (INDEPENDENT_AMBULATORY_CARE_PROVIDER_SITE_OTHER): Payer: BLUE CROSS/BLUE SHIELD | Admitting: Physician Assistant

## 2016-05-21 VITALS — BP 132/88 | HR 76 | Temp 98.2°F | Resp 18 | Ht 69.0 in | Wt 244.0 lb

## 2016-05-21 DIAGNOSIS — E119 Type 2 diabetes mellitus without complications: Secondary | ICD-10-CM

## 2016-05-21 DIAGNOSIS — I10 Essential (primary) hypertension: Secondary | ICD-10-CM

## 2016-05-21 DIAGNOSIS — Z794 Long term (current) use of insulin: Secondary | ICD-10-CM | POA: Diagnosis not present

## 2016-05-21 DIAGNOSIS — E291 Testicular hypofunction: Secondary | ICD-10-CM

## 2016-05-21 DIAGNOSIS — E785 Hyperlipidemia, unspecified: Secondary | ICD-10-CM | POA: Diagnosis not present

## 2016-05-21 MED ORDER — TESTOSTERONE CYPIONATE 200 MG/ML IM SOLN
400.0000 mg | Freq: Once | INTRAMUSCULAR | Status: AC
  Administered 2016-05-21: 400 mg via INTRAMUSCULAR

## 2016-05-21 NOTE — Progress Notes (Signed)
Subjective:    Patient ID: Russell [name], male    DOB: 1961/07/19, 55 y.o.   MRN: [id number]  HPI 55 y.o. AAM with history of DM with CKD, HTN, chol presents for 4 week med change follow up. Last visit his insulin was changed from lantus to Novolin 70/30 mix 15/10, he just started new insulin 05/21 due to insurance issues. He was suppose to have surgery for his shoulder however due to his uncontrolled sugars it was rescheduled. He is now up to 25 in the morning and 20 at night, denies low blood sugars, sugars have been lowest 100 in the AM normally around 120's. He denies changes vision, paresthesia, polydipsia, polyuria, states that vision is better. Will not check labs today, he states he needs his A1C close to 8 or 7 in order to have his surgery.   Wt Readings from Last 3 Encounters:  05/21/16 244 lb (110.678 kg)  04/30/16 236 lb 9.6 oz (107.321 kg)  02/01/16 234 lb (106.142 kg)   Lab Results  Component Value Date   HGBA1C 14.3* 02/01/2016    Blood pressure 132/88, pulse 76, temperature 98.2 F (36.8 C), temperature source Temporal, resp. rate 18, height 5\' 9"  (1.753 m), weight 244 lb (110.678 kg).  Current Outpatient Prescriptions on File Prior to Visit  Medication Sig Dispense Refill  . amLODipine (NORVASC) 10 MG tablet TAKE 1 TABLET (10 MG TOTAL) BY MOUTH DAILY. 90 tablet 1  . atorvastatin (LIPITOR) 40 MG tablet Take 0.5 tablets (20 mg total) by mouth daily. 90 tablet 1  . bisoprolol (ZEBETA) 10 MG tablet TAKE 1 TABLET (10 MG TOTAL) BY MOUTH DAILY. 90 tablet 1  . ciclesonide (OMNARIS) 50 MCG/ACT nasal spray Place 2 sprays into both nostrils daily as needed for allergies.     . clotrimazole-betamethasone (LOTRISONE) cream Apply 3 times a day as needed 15 g 11  . fluconazole (DIFLUCAN) 150 MG tablet Take 1 tab PO daily with food 7 tablet 11  . glucose blood (FREESTYLE LITE) test strip Use as instructed 100 each 12  . insulin NPH-regular Human (NOVOLIN 70/30) (70-30) 100 UNIT/ML  injection Inject 20 units in the AM [place] with food, and inject 10 units [place] with evening meal. 10 mL 4  . Insulin Syringe-Needle U-100 31G X 15/64" 0.5 ML MISC Use two pens daily with insulin 100 each 3  . metFORMIN (GLUCOPHAGE XR) 500 MG 24 hr tablet Take 2 tablets 2 x day for Diabetes 360 tablet 1  . tadalafil (CIALIS) 20 MG tablet Take 1/2 to 1 tablet every 2-3 days as needed (Patient taking differently: Take 10-20 mg by mouth daily as needed for erectile dysfunction. Take 1/2 to 1 tablet every 2-3 days as needed) 4 tablet 1  . testosterone cypionate (DEPOTESTOSTERONE CYPIONATE) 200 MG/ML injection Inject 2 mLs (400 mg total) into the muscle every 14 (fourteen) days. 10 mL 3  . Vitamin D, Ergocalciferol, (DRISDOL) 50000 units CAPS capsule TAKE ONE CAPSULE EVERY DAY OR AS DIRECTED 90 capsule 1  . ZETIA 10 MG tablet TAKE 1 TABLET BY MOUTH DAILY FOR CHOLESTEROL 30 tablet 3   No current facility-administered medications on file prior to visit.   Past Medical History  Diagnosis Date  . Hypertension   . Hyperlipidemia   . Diabetes mellitus ([place])   . Hypogonadism male   . Chronic sinusitis   . Allergy   . Allergic rhinitis     Review of Systems  Constitutional: Negative for fever and chills.  HENT: Negative for congestion, ear pain and sore throat.   Eyes: Negative.   Respiratory: Negative for cough, shortness of breath and wheezing.   Cardiovascular: Negative for chest pain, palpitations and leg swelling.  Gastrointestinal: Negative for diarrhea, constipation and blood in stool.  Genitourinary: Negative for dysuria, urgency, frequency and hematuria.  Musculoskeletal: Positive for myalgias (shoulder pain).  Skin: Negative.   Neurological: Negative for dizziness and headaches.  Psychiatric/Behavioral: The patient is not nervous/anxious.        Objective:   Physical Exam  Constitutional: He is oriented to person, place, and time. He appears well-developed and well-nourished. No distress.   HENT:  Head: Normocephalic.  Mouth/Throat: Oropharynx is clear and moist. No oropharyngeal exudate.  Eyes: Conjunctivae are normal. No scleral icterus.  Neck: Normal range of motion. Neck supple. No JVD present. No thyromegaly present.  Cardiovascular: Normal rate, regular rhythm, normal heart sounds and intact distal pulses.  Exam reveals no gallop and no friction rub.   No murmur heard. Pulmonary/Chest: Effort normal and breath sounds normal. No respiratory distress. He has no wheezes. He has no rales. He exhibits no tenderness.  Abdominal: Soft. Bowel sounds are normal. He exhibits no distension and no mass. There is no tenderness. There is no rebound and no guarding.  Musculoskeletal: Normal range of motion.  Lymphadenopathy:    He has no cervical adenopathy.  Neurological: He is alert and oriented to person, place, and time. No cranial nerve deficit. Coordination normal.  Skin: Skin is warm and dry. He is not diaphoretic.  Psychiatric: He has a normal mood and affect. His behavior is normal. Judgment and thought content normal.  Nursing note and vitals reviewed.      Assessment & Plan:  Diabetes with CKD- will recheck in August, continue insulin, counseled about hypoglycemia, will monitor and decrease insulin if needed, counseled on diet  Surgical clearance- needs A1C around 8 before surgery, no CP, SOB

## 2016-05-21 NOTE — Patient Instructions (Addendum)
Somogyi effect The brain needs two things: oxygen and sugar. If the blood sugar level drops too low in the early morning hours, hormones (such as growth hormone, cortisol, and catecholamines) are released to make sure you brain can still function. These help reverse the low blood sugar level but may lead to blood sugar levels that are higher than normal in the morning. This is common for patient that take insulin at night or do not ear regular snacks.  Please schedule to get up in the middle of the night to check your blood sugar.   This may be happening to you. Please eat a high protein night time snack and we will be decreasing your night time insulin as follows:    Your A1C is a measure of your sugar over the past 3 months and is not affected by what you have eaten over the past few days. Diabetes increases your chances of stroke and heart attack over 300 % and is the leading cause of blindness and kidney failure in the [place]. Please make sure you decrease bad carbs like white bread, white rice, potatoes, corn, soft drinks, pasta, cereals, refined sugars, sweet tea, dried fruits, and fruit juice. Good carbs are okay to eat in moderation like sweet potatoes, brown rice, whole grain pasta/bread, most fruit (except dried fruit) and you can eat as many veggies as you want.   Greater than 6.5 is considered diabetic. Between 6.4 and 5.7 is prediabetic If your A1C is less than 5.7 you are NOT diabetic.  Targets for Glucose Readings: Time of Check Target for patients WITHOUT Diabetes Target for DIABETICS  Before Meals Less than 100  less than 150  Two hours after meals Less than 200  Less than 250   Recommendations For Diabetic/Prediabetic Patients:   -  Take medications as prescribed  -  Recommend Dr Fara Olden Fuhrman's book "The End of Diabetes "  And "The End of Dieting"- Can get at  www.[employer].com and encourage also get the Audio CD book  - AVOID Animal products, ie. Meat - red/white, Poultry  and Dairy/especially cheese - Exercise at least 5 times a week for 30 minutes or preferably daily.  - No Smoking - Drink less than 2 drinks a day.  - Monitor your feet for sores - Have yearly Eye Exams - Recommend annual Flu vaccine  - Recommend Pneumovax and Prevnar vaccines - Shingles Vaccine (Zostavax) if over 12 y.o.  Goals:   - BMI less than 24 - Fasting sugar less than 130 or less than 150 if tapering medicines to lose weight  - Systolic BP less than [id number]  - Diastolic BP less than 80 - Bad LDL Cholesterol less than 70 - Triglycerides less than 150

## 2016-05-24 ENCOUNTER — Other Ambulatory Visit: Payer: Self-pay | Admitting: Physician Assistant

## 2016-05-24 MED ORDER — INSULIN NPH ISOPHANE & REGULAR (70-30) 100 UNIT/ML ~~LOC~~ SUSP: SUBCUTANEOUS | Status: DC

## 2016-05-27 ENCOUNTER — Other Ambulatory Visit: Payer: Self-pay | Admitting: Internal Medicine

## 2016-06-05 ENCOUNTER — Ambulatory Visit (INDEPENDENT_AMBULATORY_CARE_PROVIDER_SITE_OTHER): Payer: BLUE CROSS/BLUE SHIELD | Admitting: *Deleted

## 2016-06-05 DIAGNOSIS — E291 Testicular hypofunction: Secondary | ICD-10-CM | POA: Diagnosis not present

## 2016-06-05 MED ORDER — TESTOSTERONE CYPIONATE 200 MG/ML IM SOLN
400.0000 mg | Freq: Once | INTRAMUSCULAR | Status: AC
  Administered 2016-06-05: 400 mg via INTRAMUSCULAR

## 2016-06-05 NOTE — Progress Notes (Signed)
Patient ID: Russell [name], male   DOB: Sep 03, 1961, 55 y.o.   MRN: [id number] Patient presents for testosterone injection for hypogonadism Tx.  Patient received 2 cc IM left glute and tolerated well.  Will RTC in 2 weeks for next injection.

## 2016-06-19 ENCOUNTER — Ambulatory Visit (INDEPENDENT_AMBULATORY_CARE_PROVIDER_SITE_OTHER): Payer: BLUE CROSS/BLUE SHIELD

## 2016-06-19 DIAGNOSIS — E291 Testicular hypofunction: Secondary | ICD-10-CM

## 2016-06-19 MED ORDER — TESTOSTERONE CYPIONATE 200 MG/ML IM SOLN
400.0000 mg | Freq: Once | INTRAMUSCULAR | Status: AC
  Administered 2016-06-19: 400 mg via INTRAMUSCULAR

## 2016-06-19 NOTE — Progress Notes (Signed)
Patient presents for testosterone injection for hypogonadism Tx. Patient received 2 cc IM RIGHT glute and tolerated well. Will RTC in 2 weeks for next injection.

## 2016-06-20 ENCOUNTER — Other Ambulatory Visit: Payer: Self-pay

## 2016-06-20 MED ORDER — TESTOSTERONE CYPIONATE 200 MG/ML IM SOLN: 400.0000 mg | INTRAMUSCULAR | Status: DC

## 2016-06-28 ENCOUNTER — Ambulatory Visit: Payer: Self-pay | Admitting: Physician Assistant

## 2016-06-28 ENCOUNTER — Ambulatory Visit (INDEPENDENT_AMBULATORY_CARE_PROVIDER_SITE_OTHER): Payer: BLUE CROSS/BLUE SHIELD | Admitting: Internal Medicine

## 2016-06-28 ENCOUNTER — Encounter: Payer: Self-pay | Admitting: Internal Medicine

## 2016-06-28 VITALS — BP 126/84 | HR 76 | Temp 97.5°F | Resp 16 | Ht 69.0 in | Wt 248.2 lb

## 2016-06-28 DIAGNOSIS — M24112 Other articular cartilage disorders, left shoulder: Secondary | ICD-10-CM | POA: Diagnosis not present

## 2016-06-28 DIAGNOSIS — E119 Type 2 diabetes mellitus without complications: Secondary | ICD-10-CM

## 2016-06-28 DIAGNOSIS — M24812 Other specific joint derangements of left shoulder, not elsewhere classified: Secondary | ICD-10-CM

## 2016-06-28 DIAGNOSIS — Z794 Long term (current) use of insulin: Secondary | ICD-10-CM | POA: Diagnosis not present

## 2016-06-28 NOTE — Progress Notes (Signed)
  Subjective:    Patient ID: Russell [name], male    DOB: [birth year], 55 y.o.   MRN: [id number]  HPI   This very nice 55 yo MBM with HTN & Insulin Requiring T2_DM had a painful shoulder deferring surg at [facility] [place] pending better Diabetic control of A1c.  Patient is on both Metformin & Novolin 70/30 mix and as A1c's were unaccetible for elective surgery , hie Insulin dosing was increased and his diet has apparently improved by incentive to have his painful Lt Shoulder repaired. Patient reports his monitor's 2 week FBG ave is 88 mg% and his PC CBG ave is 127 MG%. He denies any diabetic polys., hypoglycemic rxns, paresthesias or visual blurring. He endorses pain in his Lt shoulder with moderate restriction in ROM.   Medication Sig  . amLODipine 10 MG tablet TAKE 1 TABLET BY MOUTH EVERY DAY  . atorvastatin  40 MG tablet Take 0.5 tablets (20 mg total) by mouth daily.  . bisoprolol  10 MG tablet TAKE 1 TABLET (10 MG TOTAL) BY MOUTH DAILY.  Marland Kitchen OMNARIS nasal spray Place 2 sprays into both nostrils daily as needed for allergies.   Marland Kitchen LOTRISONE cream Apply 3 times a day as needed  . NOVOLIN 70/30 Currently 25 u qam & 20 u qpm.  . metFORMIN-XR 500 MG  Take 2 tablets 2 x day for Diabetes  . tadalafil (CIALIS) 20 MG tablet Take 1/2 to 1 tablet every 2-3 days as needed  . testosterone cypio 200 MG/ML inj Inject 2 mLs (400 mg total) into the muscle every 14 (fourteen) days.  . Vitamin D 50,000 units  TAKE ONE CAPSULE EVERY DAY OR AS DIRECTED  . ZETIA 10 MG tablet TAKE 1 TABLET BY MOUTH DAILY FOR CHOLESTEROL   Allergies  Allergen Reactions  . 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 Hypertension   . Hyperlipidemia   . Diabetes mellitus ([place])   . Hypogonadism male   . Chronic sinusitis   . Allergy   . Allergic rhinitis    Past Surgical History  Procedure Laterality Date  . Wisdom tooth  extraction    . Hernia repair     Review of Systems  10 point systems review negative except as above.    Objective:   Physical Exam  BP 126/84   Pulse 76  Temp 97.5 F   Resp 16  Ht 5\' 9"    Wt 248 lb 3.2 oz     BMI 36.64  HEENT - Eac's patent. TM's Nl. EOM's full. PERRLA. NasoOroPharynx clear. Neck - supple. Nl Thyroid. Carotids 2+ & No bruits, nodes, JVD Chest - Clear equal BS w/o Rales, rhonchi, wheezes. Cor - Nl HS. RRR w/o sig MGR. PP 1(+). No edema. Abd - No palpable organomegaly, masses or tenderness. BS nl. MS- Muscle power, tone and bulk Nl. Gait Nl. Decreased Lt shoulder int/ext rotation & abduction.  Neuro - No obvious Cr N abnormalities. Sensory, motor and Cerebellar functions appear Nl w/o focal abnormalities.    Assessment & Plan:   1. Insulin-requiring or dependent type [name] diabetes mellitus ([place])  - Hemoglobin A1c  2. Internal derangement of left shoulder  - discussed diabetic diet , meds & SE's.

## 2016-06-29 LAB — HEMOGLOBIN A1C
Hgb A1c MFr Bld: 10 % — ABNORMAL HIGH
Mean Plasma Glucose: 240 mg/dL

## 2016-07-03 ENCOUNTER — Ambulatory Visit (INDEPENDENT_AMBULATORY_CARE_PROVIDER_SITE_OTHER): Payer: BLUE CROSS/BLUE SHIELD | Admitting: *Deleted

## 2016-07-03 DIAGNOSIS — E291 Testicular hypofunction: Secondary | ICD-10-CM | POA: Diagnosis not present

## 2016-07-03 MED ORDER — TESTOSTERONE CYPIONATE 200 MG/ML IM SOLN
400.0000 mg | Freq: Once | INTRAMUSCULAR | Status: AC
  Administered 2016-07-03: 400 mg via INTRAMUSCULAR

## 2016-07-03 NOTE — Progress Notes (Signed)
Patient here for NV for Testosterone cypionate 200 mg/ml injection 2 ml in left upper outer quadrant.  Patient tolerated well.

## 2016-07-13 ENCOUNTER — Other Ambulatory Visit: Payer: Self-pay | Admitting: Internal Medicine

## 2016-07-13 DIAGNOSIS — IMO0001 Reserved for inherently not codable concepts without codable children: Secondary | ICD-10-CM

## 2016-07-13 DIAGNOSIS — E1065 Type 1 diabetes mellitus with hyperglycemia: Principal | ICD-10-CM

## 2016-07-17 ENCOUNTER — Ambulatory Visit (INDEPENDENT_AMBULATORY_CARE_PROVIDER_SITE_OTHER): Payer: BLUE CROSS/BLUE SHIELD | Admitting: *Deleted

## 2016-07-17 DIAGNOSIS — E291 Testicular hypofunction: Secondary | ICD-10-CM | POA: Diagnosis not present

## 2016-07-17 DIAGNOSIS — E1065 Type 1 diabetes mellitus with hyperglycemia: Secondary | ICD-10-CM

## 2016-07-17 DIAGNOSIS — IMO0001 Reserved for inherently not codable concepts without codable children: Secondary | ICD-10-CM

## 2016-07-17 DIAGNOSIS — E109 Type 1 diabetes mellitus without complications: Secondary | ICD-10-CM | POA: Diagnosis not present

## 2016-07-17 MED ORDER — TESTOSTERONE CYPIONATE 200 MG/ML IM SOLN
400.0000 mg | Freq: Once | INTRAMUSCULAR | Status: AC
  Administered 2016-07-17: 400 mg via INTRAMUSCULAR

## 2016-07-17 NOTE — Progress Notes (Signed)
Patient presents for testosterone injection for hypogonadism Tx.  Patient received 2 cc IM right glut and tolerated well.

## 2016-07-18 LAB — HEMOGLOBIN A1C
Hgb A1c MFr Bld: 8.6 % — ABNORMAL HIGH (ref <5.7)
Mean Plasma Glucose: 200 mg/dL

## 2016-07-19 ENCOUNTER — Other Ambulatory Visit: Payer: Self-pay | Admitting: *Deleted

## 2016-07-19 LAB — FRUCTOSAMINE: Fructosamine: 255 umol/L (ref 190–270)

## 2016-07-19 MED ORDER — TESTOSTERONE CYPIONATE 200 MG/ML IM SOLN: 400.0000 mg | INTRAMUSCULAR | 2 refills | Status: DC

## 2016-07-20 ENCOUNTER — Encounter: Payer: Self-pay | Admitting: *Deleted

## 2016-08-03 ENCOUNTER — Ambulatory Visit: Payer: Self-pay

## 2016-08-14 ENCOUNTER — Ambulatory Visit: Payer: Self-pay | Admitting: Internal Medicine

## 2016-08-22 ENCOUNTER — Ambulatory Visit (INDEPENDENT_AMBULATORY_CARE_PROVIDER_SITE_OTHER): Payer: BLUE CROSS/BLUE SHIELD

## 2016-08-22 DIAGNOSIS — E291 Testicular hypofunction: Secondary | ICD-10-CM | POA: Diagnosis not present

## 2016-08-22 MED ORDER — TESTOSTERONE CYPIONATE 200 MG/ML IM SOLN
200.0000 mg | INTRAMUSCULAR | Status: DC
  Administered 2016-08-22 – 2016-09-12 (×2): 200 mg via INTRAMUSCULAR

## 2016-08-22 NOTE — Progress Notes (Signed)
Patient presents for testosterone injection for hypogonadism Tx. PER PT'S REQUEST: Patient received 2 cc IM RIGHT glut and tolerated well.

## 2016-08-22 NOTE — Progress Notes (Deleted)
Patient presents for testosterone injection for hypogonadism Tx.  Patient received 2 cc IM LEFT glut and tolerated well.

## 2016-09-07 DIAGNOSIS — E119 Type 2 diabetes mellitus without complications: Secondary | ICD-10-CM | POA: Diagnosis not present

## 2016-09-07 LAB — HM DIABETES EYE EXAM

## 2016-09-12 ENCOUNTER — Ambulatory Visit (INDEPENDENT_AMBULATORY_CARE_PROVIDER_SITE_OTHER): Payer: BLUE CROSS/BLUE SHIELD | Admitting: Internal Medicine

## 2016-09-12 ENCOUNTER — Encounter: Payer: Self-pay | Admitting: Internal Medicine

## 2016-09-12 VITALS — BP 140/96 | HR 72 | Temp 97.3°F | Resp 16 | Ht 69.0 in | Wt 247.0 lb

## 2016-09-12 DIAGNOSIS — Z794 Long term (current) use of insulin: Secondary | ICD-10-CM

## 2016-09-12 DIAGNOSIS — Z79899 Other long term (current) drug therapy: Secondary | ICD-10-CM

## 2016-09-12 DIAGNOSIS — E559 Vitamin D deficiency, unspecified: Secondary | ICD-10-CM | POA: Diagnosis not present

## 2016-09-12 DIAGNOSIS — E119 Type 2 diabetes mellitus without complications: Secondary | ICD-10-CM

## 2016-09-12 DIAGNOSIS — G5711 Meralgia paresthetica, right lower limb: Secondary | ICD-10-CM

## 2016-09-12 DIAGNOSIS — E782 Mixed hyperlipidemia: Secondary | ICD-10-CM | POA: Diagnosis not present

## 2016-09-12 DIAGNOSIS — E349 Endocrine disorder, unspecified: Secondary | ICD-10-CM | POA: Diagnosis not present

## 2016-09-12 DIAGNOSIS — E291 Testicular hypofunction: Secondary | ICD-10-CM | POA: Diagnosis not present

## 2016-09-12 DIAGNOSIS — I10 Essential (primary) hypertension: Secondary | ICD-10-CM

## 2016-09-12 LAB — CBC WITH DIFFERENTIAL/PLATELET
Basophils Absolute: 0 cells/uL (ref 0–200)
Basophils Relative: 0 %
Eosinophils Absolute: 186 cells/uL (ref 15–500)
Eosinophils Relative: 3 %
HCT: 50.6 % — ABNORMAL HIGH (ref 38.5–50.0)
Hemoglobin: 16.6 g/dL (ref 13.2–17.1)
Lymphocytes Relative: 30 %
Lymphs Abs: 1860 cells/uL (ref 850–3900)
MCH: 28.1 pg (ref 27.0–33.0)
MCHC: 32.8 g/dL (ref 32.0–36.0)
MCV: 85.6 fL (ref 80.0–100.0)
MPV: 9.7 fL (ref 7.5–12.5)
Monocytes Absolute: 992 cells/uL — ABNORMAL HIGH (ref 200–950)
Monocytes Relative: 16 %
Neutro Abs: 3162 cells/uL (ref 1500–7800)
Neutrophils Relative %: 51 %
Platelets: 242 10*3/uL (ref 140–400)
RBC: 5.91 MIL/uL — ABNORMAL HIGH (ref 4.20–5.80)
RDW: 15.6 % — ABNORMAL HIGH (ref 11.0–15.0)
WBC: 6.2 10*3/uL (ref 3.8–10.8)

## 2016-09-12 LAB — HEPATIC FUNCTION PANEL
ALT: 11 U/L (ref 9–46)
AST: 11 U/L (ref 10–35)
Albumin: 4.1 g/dL (ref 3.6–5.1)
Alkaline Phosphatase: 62 U/L (ref 40–115)
Bilirubin, Direct: 0.2 mg/dL (ref <=0.2)
Indirect Bilirubin: 0.6 mg/dL (ref 0.2–1.2)
Total Bilirubin: 0.8 mg/dL (ref 0.2–1.2)
Total Protein: 6.9 g/dL (ref 6.1–8.1)

## 2016-09-12 LAB — BASIC METABOLIC PANEL WITH GFR
BUN: 23 mg/dL (ref 7–25)
CO2: 24 mmol/L (ref 20–31)
Calcium: 9.8 mg/dL (ref 8.6–10.3)
Chloride: 101 mmol/L (ref 98–110)
Creat: 1.35 mg/dL — ABNORMAL HIGH (ref 0.70–1.33)
GFR, Est African American: 68 mL/min (ref >=60)
GFR, Est Non African American: 59 mL/min — ABNORMAL LOW (ref >=60)
Glucose, Bld: 268 mg/dL — ABNORMAL HIGH (ref 65–99)
Potassium: 4.5 mmol/L (ref 3.5–5.3)
Sodium: 136 mmol/L (ref 135–146)

## 2016-09-12 LAB — LIPID PANEL
Cholesterol: 128 mg/dL (ref 125–200)
HDL: 32 mg/dL — ABNORMAL LOW (ref >=40)
LDL Cholesterol: 67 mg/dL (ref <130)
Total CHOL/HDL Ratio: 4 Ratio (ref <=5.0)
Triglycerides: 144 mg/dL (ref <150)
VLDL: 29 mg/dL (ref <30)

## 2016-09-12 LAB — TSH: TSH: 0.87 mIU/L (ref 0.40–4.50)

## 2016-09-12 LAB — MAGNESIUM: Magnesium: 2 mg/dL (ref 1.5–2.5)

## 2016-09-12 MED ORDER — TESTOSTERONE CYPIONATE 200 MG/ML IM SOLN: 400.0000 mg | Freq: Once | INTRAMUSCULAR | Status: DC

## 2016-09-12 MED ORDER — GABAPENTIN 300 MG PO CAPS: ORAL_CAPSULE | ORAL | 2 refills | Status: DC

## 2016-09-12 NOTE — Progress Notes (Signed)
[facility] Unk Pinto, M.D.        Uvaldo Bristle. Silverio Lay, P.A.-C       Starlyn Skeans, P.A.-C  [facility]                [address] [place], [place] [id number] Telephone [phone] Telefax [phone] ______________________________________________________________________     This very nice 55 y.o. MBM presents for 6 month follow up with Hypertension, Hyperlipidemia, Insulin req T2_DM, Testosterone and [name].      Patient is treated for HTN & BP has been controlled at home. Today's BP  is 140/96 and rechecked at 136/86.   Patient has had no complaints of any cardiac type chest pain, palpitations, dyspnea/orthopnea/PND, dizziness, claudication, or dependent edema.     Hyperlipidemia is controlled with diet & meds. Patient denies myalgias or other med SE's. 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 Patient does report sting paresthesias of the Rt anterior thigh for several weeks.  Kidney function has improved over the last year from [facility] [id number] and most recently improved to GFR 83 ml/min in May (CKD2). Patient is a compulsive over eater & is poorly dietary compliant and last A1c was not at goal: Lab Results  Component Value Date   HGBA1C 8.6 (H) 07/17/2016      Further, the patient also has history of [name] and supplements vitamin D without any suspected side-effects. Last vitamin D was high & dose was tapered:  Lab Results  Component Value Date   VD25OH 150 (H) 04/30/2016   Current Outpatient Prescriptions on File Prior to Visit   Medication Sig  . amLODipine  10 MG tablet TAKE 1 TABLET BY MOUTH EVERY DAY  . atorvastatin  40 MG tablet Take 0.5 tabdaily.  . bisoprolol  10 MG tablet TAKE 1 TAB DAILY.  Marland Kitchen OMNARIS  nasal spray Place 2 sprays into both nostrils daily as needed   . LOTRISONE cream Apply 3 times a day as needed  . NOVOLIN 70/30 adjust according to sugars.  . tadalafil  20 MG tablet Take 1/2 to 1 tablet every 2-3 days as needed   . Vitamin D  50000 units  TAKE ONE CAPSULE EVERY DAY OR AS DIRECTED  . ZETIA 10 MG tablet TAKE 1 TABLET BY MOUTH DAILY FOR CHOLESTEROL  . metFORMIN-XR 500 MG  Take 2 tablets 2 x day for Diabetes   Allergies  Allergen Reactions  . Ppd [Tuberculin Purified Protein Derivative] Other (See Comments)    POSITIVE REACTION IN PAST 11/21/11 CXR NEG   PMHx:   Past Medical History:  Diagnosis Date  . Allergic rhinitis   . Allergy   . Chronic sinusitis   . Diabetes mellitus ([place])   . Hyperlipidemia   . Hypertension   . Hypogonadism male    Immunization History  Administered Date(s) Administered  . Influenza Split 10/10/2013  . Influenza Whole 09/08/2012  . Pneumococcal-Unspecified 12/31/2011  . Tdap 12/19/2009   Past Surgical History:  Procedure Laterality Date  . HERNIA REPAIR    . WISDOM TOOTH EXTRACTION     FHx:    Reviewed / unchanged  SHx:    Reviewed / unchanged  Systems Review:  Constitutional: Denies fever, chills, wt changes, headaches, insomnia, fatigue, night sweats, change in appetite. Eyes: Denies redness, blurred vision, diplopia, discharge, itchy, watery eyes.  ENT: Denies discharge, congestion, post nasal drip, epistaxis, sore throat, earache, hearing loss, dental pain, tinnitus, vertigo, sinus pain, snoring.  CV: Denies chest pain, palpitations, irregular heartbeat, syncope, dyspnea, diaphoresis, orthopnea, PND, claudication or edema. Respiratory: denies cough, dyspnea, DOE, pleurisy, hoarseness, laryngitis, wheezing.  Gastrointestinal: Denies  dysphagia, odynophagia, heartburn, reflux, water brash, abdominal pain or cramps, nausea, vomiting, bloating, diarrhea, constipation, hematemesis, melena, hematochezia  or hemorrhoids. Genitourinary: Denies dysuria, frequency, urgency, nocturia, hesitancy, discharge, hematuria or flank pain. Musculoskeletal: Denies arthralgias, myalgias, stiffness, jt. swelling, pain, limping or strain/sprain.  Skin: Denies pruritus, rash, hives, warts, acne, eczema or change in skin lesion(s). Neuro: No weakness, tremor, incoordination, spasms, paresthesia or pain. Psychiatric: Denies confusion, memory loss or sensory loss. Endo: Denies change in weight, skin or hair change.  Heme/Lymph: No excessive bleeding, bruising or enlarged lymph nodes.  Physical Exam BP  140/96 - reck'd 136/86  P72   T 97.3 F    R 16   Ht 5\' 9"     Wt 247 lb    BMI 36.48   Appears over nourished and in no distress.  Eyes: PERRLA, EOMs, conjunctiva no swelling or erythema. Sinuses: No frontal/maxillary tenderness ENT/Mouth: EAC's clear, TM's nl w/o erythema, bulging. Nares clear w/o erythema, swelling, exudates. Oropharynx clear without erythema or exudates. Oral hygiene is good. Tongue normal, non obstructing. Hearing intact.  Neck: Supple. Thyroid nl. Car 2+/2+ without bruits, nodes or JVD. Chest: Respirations nl with BS clear & equal w/o rales, rhonchi, wheezing or stridor.  Cor: Heart sounds normal w/ regular rate and rhythm without sig. murmurs, gallops, clicks, or rubs. Peripheral pulses normal and equal  without edema.  Abdomen: Soft & bowel sounds normal. Non-tender w/o guarding, rebound, hernias, masses, or organomegaly.  Lymphatics: Unremarkable.  Musculoskeletal: Full ROM all peripheral extremities, joint stability, 5/5 strength, and normal gait.  Skin: Warm, dry without exposed rashes, lesions or ecchymosis apparent.  Neuro: Cranial nerves intact, reflexes equal bilaterally. Sensory-motor testing grossly intact.  Tendon reflexes grossly intact.  Pysch: Alert & oriented x 3.  Insight and judgement nl & appropriate. No ideations.  Assessment and Plan:  1. Essential hypertension  - Continue medication, monitor blood pressure at home. Continue DASH diet. Reminder to go to the ER if any CP, SOB, nausea, dizziness, severe HA, changes vision/speech, left arm numbness and tingling and jaw pain. - TSH  2. Mixed hyperlipidemia  - Continue diet/meds, exercise,& lifestyle modifications. Continue monitor periodic cholesterol/liver & renal functions  - Lipid panel - TSH  3. Insulin-requiring or dependent type II diabetes mellitus ([place])  - Encouraged stricter diet, regular exercise, lifestyle modifications. Monitor appropriate labs. - Hemoglobin A1c  4. [name]  - Continue supplementation. - VITAMIN D 25 Hydroxy   5. Testosterone deficiency  - DEPO-TESTOSTERONE  injection 400 mg; Inject 2 mLs  into the muscle every 2 weeks  6. Medication management  - CBC with Differential/Platelet - BASIC METABOLIC PANEL WITH GFR - Hepatic function panel - Magnesium  7. Meralgia paraesthetica, right  - gabapentin (NEURONTIN) 300 MG capsule; Take 1 capsule 3 x/day for Neuropathy pain  Dispense: 90 capsule; Refill: 2  Recommended regular exercise, BP monitoring, better diet for weight control, and discussed med and SE's. Recommended labs to assess and monitor clinical status. Further disposition pending results of labs. Over 30 minutes of exam, counseling, chart review was performed

## 2016-09-12 NOTE — Patient Instructions (Signed)

## 2016-09-13 LAB — VITAMIN D 25 HYDROXY (VIT D DEFICIENCY, FRACTURES): Vit D, 25-Hydroxy: 80 ng/mL (ref 30–100)

## 2016-09-13 LAB — HEMOGLOBIN A1C
Hgb A1c MFr Bld: 7.9 % — ABNORMAL HIGH (ref <5.7)
Mean Plasma Glucose: 180 mg/dL

## 2016-09-18 ENCOUNTER — Encounter: Payer: Self-pay | Admitting: *Deleted

## 2016-09-19 DIAGNOSIS — S37011A Minor contusion of right kidney, initial encounter: Secondary | ICD-10-CM | POA: Diagnosis not present

## 2016-09-26 DIAGNOSIS — Z23 Encounter for immunization: Secondary | ICD-10-CM | POA: Diagnosis not present

## 2016-09-28 ENCOUNTER — Ambulatory Visit: Payer: Self-pay

## 2016-10-01 ENCOUNTER — Other Ambulatory Visit: Payer: Self-pay | Admitting: Internal Medicine

## 2016-10-01 ENCOUNTER — Ambulatory Visit: Payer: Self-pay

## 2016-10-12 ENCOUNTER — Ambulatory Visit (INDEPENDENT_AMBULATORY_CARE_PROVIDER_SITE_OTHER): Payer: BLUE CROSS/BLUE SHIELD | Admitting: *Deleted

## 2016-10-12 DIAGNOSIS — E291 Testicular hypofunction: Secondary | ICD-10-CM | POA: Diagnosis not present

## 2016-10-12 MED ORDER — TESTOSTERONE CYPIONATE 200 MG/ML IM SOLN
400.0000 mg | Freq: Once | INTRAMUSCULAR | Status: AC
  Administered 2016-10-12: 400 mg via INTRAMUSCULAR

## 2016-10-12 MED ORDER — TESTOSTERONE CYPIONATE 200 MG/ML IM SOLN: 400.0000 mg | Freq: Once | INTRAMUSCULAR | 2 refills | Status: DC

## 2016-10-12 NOTE — Progress Notes (Signed)
Patient presents for testosterone injection for hypogonadism Tx.  Patient received 2 cc IM right glut and tolerated well.  New Rx was called into the pharmacy for patient to pick up and bring for next injection.

## 2016-10-21 ENCOUNTER — Encounter: Payer: Self-pay | Admitting: *Deleted

## 2016-10-26 ENCOUNTER — Ambulatory Visit (INDEPENDENT_AMBULATORY_CARE_PROVIDER_SITE_OTHER): Payer: BLUE CROSS/BLUE SHIELD

## 2016-10-26 DIAGNOSIS — E291 Testicular hypofunction: Secondary | ICD-10-CM

## 2016-10-26 MED ORDER — TESTOSTERONE CYPIONATE 200 MG/ML IM SOLN
200.0000 mg | INTRAMUSCULAR | Status: DC
  Administered 2016-10-26: 200 mg via INTRAMUSCULAR

## 2016-10-26 NOTE — Progress Notes (Signed)
Patient presents for testosterone injection for hypogonadism Tx.  Patient received 2 cc (per pt's request) IM right glut and tolerated well.

## 2016-11-09 ENCOUNTER — Ambulatory Visit (INDEPENDENT_AMBULATORY_CARE_PROVIDER_SITE_OTHER): Payer: BLUE CROSS/BLUE SHIELD

## 2016-11-09 DIAGNOSIS — E291 Testicular hypofunction: Secondary | ICD-10-CM

## 2016-11-09 MED ORDER — TESTOSTERONE CYPIONATE 200 MG/ML IM SOLN
200.0000 mg | INTRAMUSCULAR | Status: DC
  Administered 2016-11-09: 200 mg via INTRAMUSCULAR

## 2016-11-09 NOTE — Progress Notes (Signed)
Patient presents for testosterone injection for hypogonadism Tx. Patient received 2 cc IM LEFT glut and tolerated well.

## 2016-11-11 ENCOUNTER — Other Ambulatory Visit: Payer: Self-pay | Admitting: Physician Assistant

## 2016-11-23 ENCOUNTER — Ambulatory Visit (INDEPENDENT_AMBULATORY_CARE_PROVIDER_SITE_OTHER): Payer: BLUE CROSS/BLUE SHIELD

## 2016-11-23 DIAGNOSIS — E291 Testicular hypofunction: Secondary | ICD-10-CM

## 2016-11-23 MED ORDER — TESTOSTERONE CYPIONATE 200 MG/ML IM SOLN
200.0000 mg | INTRAMUSCULAR | Status: DC
  Administered 2016-11-23 – 2017-01-18 (×3): 200 mg via INTRAMUSCULAR

## 2016-11-23 NOTE — Progress Notes (Signed)
Patient presents for testosterone injection for hypogonadism Tx. Patient received 2 cc IM RIGHT glut and tolerated well.

## 2016-11-26 ENCOUNTER — Other Ambulatory Visit: Payer: Self-pay | Admitting: Physician Assistant

## 2016-11-28 ENCOUNTER — Other Ambulatory Visit: Payer: Self-pay | Admitting: Internal Medicine

## 2016-11-28 DIAGNOSIS — R300 Dysuria: Secondary | ICD-10-CM

## 2016-12-04 DIAGNOSIS — J018 Other acute sinusitis: Secondary | ICD-10-CM | POA: Diagnosis not present

## 2016-12-04 DIAGNOSIS — R05 Cough: Secondary | ICD-10-CM | POA: Diagnosis not present

## 2016-12-07 ENCOUNTER — Ambulatory Visit (INDEPENDENT_AMBULATORY_CARE_PROVIDER_SITE_OTHER): Payer: BLUE CROSS/BLUE SHIELD | Admitting: *Deleted

## 2016-12-07 DIAGNOSIS — E291 Testicular hypofunction: Secondary | ICD-10-CM | POA: Diagnosis not present

## 2016-12-07 MED ORDER — TESTOSTERONE CYPIONATE 200 MG/ML IM SOLN
400.0000 mg | Freq: Once | INTRAMUSCULAR | Status: AC
  Administered 2016-12-07: 400 mg via INTRAMUSCULAR

## 2016-12-07 NOTE — Progress Notes (Signed)
Patient here for a NV to administer Testosterone Cypionate 200 mg /ml 2 ml IM in left upper outer quadrant. Patient tolerated well.

## 2016-12-20 ENCOUNTER — Other Ambulatory Visit: Payer: Self-pay

## 2016-12-20 MED ORDER — ATORVASTATIN CALCIUM 20 MG PO TABS: 20.0000 mg | ORAL_TABLET | Freq: Every day | ORAL | 2 refills | Status: DC

## 2016-12-21 ENCOUNTER — Ambulatory Visit: Payer: Self-pay

## 2016-12-28 ENCOUNTER — Ambulatory Visit: Payer: Self-pay | Admitting: Internal Medicine

## 2017-01-03 ENCOUNTER — Ambulatory Visit (INDEPENDENT_AMBULATORY_CARE_PROVIDER_SITE_OTHER): Payer: BLUE CROSS/BLUE SHIELD | Admitting: Internal Medicine

## 2017-01-03 ENCOUNTER — Encounter: Payer: Self-pay | Admitting: Internal Medicine

## 2017-01-03 VITALS — BP 140/82 | HR 90 | Temp 100.0°F | Resp 18 | Ht 69.0 in

## 2017-01-03 DIAGNOSIS — E291 Testicular hypofunction: Secondary | ICD-10-CM

## 2017-01-03 DIAGNOSIS — J101 Influenza due to other identified influenza virus with other respiratory manifestations: Secondary | ICD-10-CM | POA: Diagnosis not present

## 2017-01-03 LAB — POCT INFLUENZA A/B
Influenza A, POC: POSITIVE — AB
Influenza B, POC: NEGATIVE

## 2017-01-03 MED ORDER — PROMETHAZINE-DM 6.25-15 MG/5ML PO SYRP: ORAL_SOLUTION | ORAL | 1 refills | Status: DC

## 2017-01-03 MED ORDER — PREDNISONE 20 MG PO TABS: ORAL_TABLET | ORAL | 0 refills | Status: DC

## 2017-01-03 MED ORDER — AZITHROMYCIN 250 MG PO TABS: ORAL_TABLET | ORAL | 0 refills | Status: DC

## 2017-01-03 NOTE — Patient Instructions (Signed)
Please take tylenol 1000 mg every 6-8 hours as needed for fevers.  Please take prednisone as prescribed until it is gone.  Please take zpak until completely gone.  Please use 2 sprays of flonase per nostril.  Please take phenergan dm up to 3 times per day as needed for coughing.  Please take claritin, zyrtec or allegra 1 tablet per day to help dry up congesiton.  Please take 25-50 mg of benadryl as needed at bedtime to help dry up congestion.  Please do not return to work until you are 24 hours fever free.  Please drink as many fluids as possible to remain well hydrated.

## 2017-01-03 NOTE — Progress Notes (Signed)
HPI  Patient presents to the office for evaluation of cough.  It has been going on for 4 days.  Patient reports night > day, wet, worse with lying down.  They also endorse change in voice, chills, fever, postnasal drip, shortness of breath, sputum production and yellow brown sputum, clear nasal rhinorrhea, ear pain, scratchy throat, chills, temps as high as 103.  .  They have tried tylenol and mucinex.  They report that nothing has worked.  They admits to other sick contacts.  He had a sick child that sneezed on him at a restaurant.  He has been doing alright with blood sugars.  He is on the whole less than 200.   Review of Systems  Constitutional: Positive for chills, fever and malaise/fatigue.  HENT: Positive for congestion, ear pain, hearing loss and sore throat.   Respiratory: Positive for cough and sputum production. Negative for shortness of breath and wheezing.   Cardiovascular: Negative for chest pain, palpitations and leg swelling.  Neurological: Positive for headaches.    PE:  Vitals:   01/03/17 1122  BP: 140/82  Pulse: 90  Resp: 18  Temp: 100 F (37.8 C)    General:  Alert and non-toxic, WDWN, NAD HEENT: NCAT, PERLA, EOM normal, no occular discharge or erythema.  Nasal mucosal edema with sinus tenderness to palpation.  Oropharynx clear with minimal oropharyngeal edema and erythema.  Mucous membranes moist and pink. Neck:  Cervical adenopathy Chest:  RRR no MRGs.  Lungs clear to auscultation A&P with no wheezes rhonchi or rales.   Abdomen: +BS x 4 quadrants, soft, non-tender, no guarding, rigidity, or rebound. Skin: warm and dry no rash Neuro: A&Ox4, CN II-XII grossly intact  Assessment and Plan:   1. Influenza A -positive flu A here on rapid POCT influenza test -given diabetes high likelihood for superimposed bacterial infection so will cover with a zpak -outside the window for tamiflu -tylenol 1000 mg was given her -encouraged hydration -claritin, zyrtec or allegra  daily to help congestion -benadryl qhs for congestion - predniSONE (DELTASONE) 20 MG tablet; 3 tabs po daily x 3 days, then 2 tabs x 3 days, then 1.5 tabs x 3 days, then 1 tab x 3 days, then 0.5 tabs x 3 days  Dispense: 27 tablet; Refill: 0 - azithromycin (ZITHROMAX Z-PAK) 250 MG tablet; 2 po day one, then 1 daily x 4 days  Dispense: 6 tablet; Refill: 0 - promethazine-dextromethorphan (PROMETHAZINE-DM) 6.25-15 MG/5ML syrup; Take 5-10 ML POq8hrs prn for cough  Dispense: 180 mL; Refill: 1

## 2017-01-18 ENCOUNTER — Ambulatory Visit (INDEPENDENT_AMBULATORY_CARE_PROVIDER_SITE_OTHER): Payer: BLUE CROSS/BLUE SHIELD | Admitting: *Deleted

## 2017-01-18 DIAGNOSIS — E291 Testicular hypofunction: Secondary | ICD-10-CM | POA: Diagnosis not present

## 2017-01-18 MED ORDER — TESTOSTERONE CYPIONATE 200 MG/ML IM SOLN: 400.0000 mg | Freq: Once | INTRAMUSCULAR | 3 refills | Status: DC

## 2017-01-18 NOTE — Progress Notes (Signed)
Patient presents for testosterone injection.  Patient received 2 cc IM left glut and tolerated well.  Will RTC in 2 weeks for next injection.

## 2017-01-23 ENCOUNTER — Other Ambulatory Visit: Payer: Self-pay | Admitting: Urology

## 2017-01-23 DIAGNOSIS — D49519 Neoplasm of unspecified behavior of unspecified kidney: Secondary | ICD-10-CM

## 2017-02-01 ENCOUNTER — Encounter (HOSPITAL_COMMUNITY): Payer: Self-pay

## 2017-02-01 ENCOUNTER — Ambulatory Visit (INDEPENDENT_AMBULATORY_CARE_PROVIDER_SITE_OTHER): Payer: BLUE CROSS/BLUE SHIELD | Admitting: *Deleted

## 2017-02-01 ENCOUNTER — Ambulatory Visit (HOSPITAL_COMMUNITY): Payer: BLUE CROSS/BLUE SHIELD

## 2017-02-01 DIAGNOSIS — E291 Testicular hypofunction: Secondary | ICD-10-CM | POA: Diagnosis not present

## 2017-02-01 MED ORDER — TESTOSTERONE CYPIONATE 200 MG/ML IM SOLN
400.0000 mg | Freq: Once | INTRAMUSCULAR | Status: AC
  Administered 2017-02-01: 400 mg via INTRAMUSCULAR

## 2017-02-01 NOTE — Progress Notes (Signed)
Patient here for testosterone injection 200 ml/mg 74ml  IM right upper outer quadrant.  Tolerated well

## 2017-02-05 ENCOUNTER — Other Ambulatory Visit: Payer: Self-pay | Admitting: *Deleted

## 2017-02-05 MED ORDER — EZETIMIBE 10 MG PO TABS: ORAL_TABLET | ORAL | 1 refills | Status: DC

## 2017-02-15 ENCOUNTER — Ambulatory Visit: Payer: Self-pay

## 2017-02-19 ENCOUNTER — Ambulatory Visit (INDEPENDENT_AMBULATORY_CARE_PROVIDER_SITE_OTHER): Payer: BLUE CROSS/BLUE SHIELD | Admitting: *Deleted

## 2017-02-19 DIAGNOSIS — E291 Testicular hypofunction: Secondary | ICD-10-CM | POA: Diagnosis not present

## 2017-02-19 MED ORDER — TESTOSTERONE CYPIONATE 200 MG/ML IM SOLN
400.0000 mg | Freq: Once | INTRAMUSCULAR | Status: AC
  Administered 2017-02-19: 400 mg via INTRAMUSCULAR

## 2017-02-19 NOTE — Progress Notes (Signed)
Patient here for NV to inject Testosterone Cypionate 200 mg/ml 2 mls IM in left upper outer quadrant.  Patient tolerated well.

## 2017-03-07 ENCOUNTER — Ambulatory Visit (INDEPENDENT_AMBULATORY_CARE_PROVIDER_SITE_OTHER): Payer: BLUE CROSS/BLUE SHIELD

## 2017-03-07 DIAGNOSIS — E291 Testicular hypofunction: Secondary | ICD-10-CM

## 2017-03-07 MED ORDER — TESTOSTERONE CYPIONATE 200 MG/ML IM SOLN
200.0000 mg | INTRAMUSCULAR | Status: DC
  Administered 2017-03-07: 200 mg via INTRAMUSCULAR

## 2017-03-07 NOTE — Progress Notes (Signed)
Patient here for NV to inject Testosterone Cypionate 200 mg/ml 2 mls IM in right upper outer quadrant.  Patient tolerated well.

## 2017-03-08 ENCOUNTER — Ambulatory Visit: Payer: Self-pay

## 2017-03-21 ENCOUNTER — Ambulatory Visit (INDEPENDENT_AMBULATORY_CARE_PROVIDER_SITE_OTHER): Payer: BLUE CROSS/BLUE SHIELD

## 2017-03-21 DIAGNOSIS — E291 Testicular hypofunction: Secondary | ICD-10-CM | POA: Diagnosis not present

## 2017-03-21 MED ORDER — TESTOSTERONE CYPIONATE 200 MG/ML IM SOLN
200.0000 mg | INTRAMUSCULAR | Status: DC
  Administered 2017-03-21: 200 mg via INTRAMUSCULAR

## 2017-03-21 NOTE — Progress Notes (Signed)
Patient here for NV to inject Testosterone Cypionate 200 mg/ml 2 mls IM in left upper outer quadrant. Patient tolerated well.

## 2017-04-01 ENCOUNTER — Other Ambulatory Visit: Payer: Self-pay | Admitting: Internal Medicine

## 2017-04-04 ENCOUNTER — Ambulatory Visit (INDEPENDENT_AMBULATORY_CARE_PROVIDER_SITE_OTHER): Payer: BLUE CROSS/BLUE SHIELD | Admitting: *Deleted

## 2017-04-04 DIAGNOSIS — E291 Testicular hypofunction: Secondary | ICD-10-CM | POA: Diagnosis not present

## 2017-04-04 MED ORDER — TESTOSTERONE CYPIONATE 200 MG/ML IM SOLN
400.0000 mg | Freq: Once | INTRAMUSCULAR | Status: AC
  Administered 2017-04-04: 400 mg via INTRAMUSCULAR

## 2017-04-04 NOTE — Progress Notes (Signed)
Patient here for a NV for Testosterone Cypionate 200 mg/ml 2 ml IM in right upper outer quadrant.  Patient tolerated well.

## 2017-04-13 ENCOUNTER — Other Ambulatory Visit: Payer: Self-pay | Admitting: Internal Medicine

## 2017-04-18 ENCOUNTER — Ambulatory Visit (INDEPENDENT_AMBULATORY_CARE_PROVIDER_SITE_OTHER): Payer: BLUE CROSS/BLUE SHIELD

## 2017-04-18 DIAGNOSIS — E349 Endocrine disorder, unspecified: Secondary | ICD-10-CM | POA: Diagnosis not present

## 2017-04-18 MED ORDER — TESTOSTERONE CYPIONATE 200 MG/ML IM SOLN: 200.0000 mg | INTRAMUSCULAR | Status: DC

## 2017-04-18 NOTE — Progress Notes (Signed)
Patient here for a NV for Testosterone Cypionate 200 mg/ml 2 ml IM in LEFT upper outer quadrant.  Patient tolerated well.

## 2017-04-27 ENCOUNTER — Other Ambulatory Visit: Payer: Self-pay | Admitting: Internal Medicine

## 2017-05-01 ENCOUNTER — Ambulatory Visit (INDEPENDENT_AMBULATORY_CARE_PROVIDER_SITE_OTHER): Payer: BLUE CROSS/BLUE SHIELD | Admitting: *Deleted

## 2017-05-01 DIAGNOSIS — E291 Testicular hypofunction: Secondary | ICD-10-CM

## 2017-05-01 MED ORDER — TESTOSTERONE CYPIONATE 200 MG/ML IM SOLN
400.0000 mg | Freq: Once | INTRAMUSCULAR | Status: AC
  Administered 2017-05-01: 400 mg via INTRAMUSCULAR

## 2017-05-01 NOTE — Progress Notes (Signed)
Patient here for a NV for Testosterone Cypionate injection 200 mg/ml 2 ml IM in the right upper outer quadrant. Patient tolerated well and will return in 2 weeks for next injection.

## 2017-05-16 ENCOUNTER — Ambulatory Visit: Payer: Self-pay

## 2017-05-27 ENCOUNTER — Other Ambulatory Visit: Payer: Self-pay | Admitting: Internal Medicine

## 2017-05-29 NOTE — Progress Notes (Signed)
Patient ID: Russell [name], male   DOB: 1961/06/09, 56 y.o.   MRN: [id number]  Assessment and Plan:  Hypertension:  -Continue medication -monitor blood pressure at home. -Continue DASH diet -Reminder to go to the ER if any CP, SOB, nausea, dizziness, severe HA, changes vision/speech, left arm numbness and tingling and jaw pain.  Cholesterol - Continue diet and exercise -Check cholesterol.   Diabetes with diabetic chronic kidney disease -Continue diet and exercise.  -Check A1C - will stop lantus 35 units and switch to 70/30 mix twice a day Will start 15 and 10 units WITH food  Vitamin D Def -check level -continue medications.   Testosterone Def -given injection today -check level  Morbid Obesity with co morbidities - long discussion about weight loss, diet, and exercise  Continue diet and meds as discussed. Further disposition pending results of labs. Discussed med's effects and SE's.   Future Appointments Date Time Provider [place]  05/30/2017 9:30 AM Vicie Mutters, PA-C [facility] None  06/06/2017 8:00 AM [facility] 1 [facility] [place]  06/14/2017 8:30 AM [facility] NURSE [facility] None    HPI 56 y.o. AA male  presents for 3 month follow up with hypertension, hyperlipidemia, diabetes and [name].  Has seen Dr. Jeffie Pollock for hematuria.    His blood pressure has been controlled at home, today their BP is BP: 132/74.He does workout. He denies chest pain, shortness of breath, dizziness. He has new job, doing much walking at new job, averaging 3-4 miles.    He is on cholesterol medication and denies myalgias. His cholesterol is at goal. The cholesterol was:  09/12/2016: Cholesterol 128; HDL 32; LDL Cholesterol 67; Triglycerides 144   He has been working on diet and exercise for diabetes with diabetic chronic kidney disease, he is on bASA, he is on ACE/ARB,  He is on Metformin and 70/30 mix 25-30 units but still has fasting sugars in the lowest 80's- highest  160's. and denies  foot ulcerations, hyperglycemia, hypoglycemia , increased appetite, nausea, paresthesia of the feet, polydipsia, polyuria, visual disturbances, vomiting and weight loss. Last A1C was: 09/12/2016: Hgb A1c MFr Bld 7.9.   Lab Results  Component Value Date   GFRAA 68 09/12/2016   Patient is on Vitamin D supplement. 09/12/2016: Vit D, 25-Hydroxy 80  He has a history of testosterone deficiency and is on testosterone replacement. He states that the testosterone helps with his energy, libido, muscle mass. Lab Results  Component Value Date   TESTOSTERONE 320 02/01/2016   BMI is Body mass index is 37.21 kg/m., he is working on diet and exercise. Wt Readings from Last 3 Encounters:  05/30/17 252 lb (114.3 kg)  09/12/16 247 lb (112 kg)  06/28/16 248 lb 3.2 oz (112.6 kg)    Current Medications:  Current Outpatient Prescriptions on File Prior to Visit  Medication Sig Dispense Refill  . amLODipine (NORVASC) 10 MG tablet TAKE 1 TABLET BY MOUTH EVERY DAY 90 tablet 1  . atorvastatin (LIPITOR) 20 MG tablet Take 1 tablet (20 mg total) by mouth daily. 90 tablet 2  . azithromycin (ZITHROMAX Z-PAK) 250 MG tablet 2 po day one, then 1 daily x 4 days 6 tablet 0  . BD INSULIN SYRINGE ULTRAFINE 31G X 15/64" 0.5 ML MISC USE TWO PENS DAILY WITH INSULIN 100 each 3  . bisoprolol (ZEBETA) 10 MG tablet TAKE 1 TABLET (10 MG TOTAL) BY MOUTH DAILY. 90 tablet 0  . ciclesonide (OMNARIS) 50 MCG/ACT nasal spray Place 2 sprays into  both nostrils daily as needed for allergies.     . clotrimazole-betamethasone (LOTRISONE) cream APPLY 3 TIMES A DAY AS NEEDED 15 g 4  . ezetimibe (ZETIA) 10 MG tablet TAKE 1 TABLET EVERY DAY FOR CHOLESTEROL 90 tablet 1  . fluconazole (DIFLUCAN) 150 MG tablet TAKE 1 TABLET EVERY DAY WITH FOOD 7 tablet 7  . glucose blood (FREESTYLE LITE) test strip Use as instructed 100 each 12  . NOVOLIN 70/30 (70-30) 100 UNIT/ML injection INJECT UP TO 30 UNITS IN MORNING WITH FOOD & UP TO 30  UNITS WITH EVENING MEAL,ADJUST TO SUGARS 20 mL 4  . promethazine-dextromethorphan (PROMETHAZINE-DM) 6.25-15 MG/5ML syrup Take 5-10 ML POq8hrs prn for cough 180 mL 1  . tadalafil (CIALIS) 20 MG tablet Take 1/2 to 1 tablet every 2-3 days as needed (Patient taking differently: Take 10-20 mg by mouth daily as needed for erectile dysfunction. Take 1/2 to 1 tablet every 2-3 days as needed) 4 tablet 1  . Vitamin D, Ergocalciferol, (DRISDOL) 50000 units CAPS capsule TAKE ONE CAPSULE EVERY DAY OR AS DIRECTED 90 capsule 1  . gabapentin (NEURONTIN) 300 MG capsule Take 1 capsule 3 x/day for Neuropathy pain 90 capsule 2  . metFORMIN (GLUCOPHAGE XR) 500 MG 24 hr tablet Take 2 tablets 2 x day for Diabetes 360 tablet 1  . testosterone cypionate (DEPOTESTOSTERONE CYPIONATE) 200 MG/ML injection Inject 2 mLs (400 mg total) into the muscle once. 10 mL 3   No current facility-administered medications on file prior to visit.    Medical History:  Past Medical History:  Diagnosis Date  . Allergic rhinitis   . Allergy   . Chronic sinusitis   . Diabetes mellitus ([place])   . Hyperlipidemia   . Hypertension   . Hypogonadism male    Allergies:  Allergies  Allergen Reactions  . 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  HENT: Negative for congestion, ear pain and sore throat.   Eyes: Negative.   Respiratory: Negative for cough, shortness of breath and wheezing.   Cardiovascular: Negative for chest pain, palpitations and leg swelling.  Gastrointestinal: Negative for blood in stool, constipation, diarrhea, heartburn and melena.  Genitourinary: Negative for dysuria, frequency, hematuria and urgency.  Musculoskeletal: Positive for myalgias (shoulder pain).  Skin: Negative.   Neurological: Negative for dizziness, sensory change, loss of  consciousness and headaches.  Psychiatric/Behavioral: Negative for depression. The patient is not nervous/anxious and does not have insomnia.     Family history- Review and unchanged  Social history- Review and unchanged  Physical Exam: BP 132/74   Pulse 78   Temp 97.7 F (36.5 C)   Resp 16   Ht 5\' 9"  (1.753 m)   Wt 252 lb (114.3 kg)   SpO2 96%   BMI 37.21 kg/m  Wt Readings from Last 3 Encounters:  05/30/17 252 lb (114.3 kg)  09/12/16 247 lb (112 kg)  06/28/16 248 lb 3.2 oz (112.6 kg)   General Appearance: Well nourished well developed, non-toxic appearing, in no apparent distress. Eyes: PERRLA, EOMs, conjunctiva no swelling or erythema ENT/Mouth: Ear canals clear with no erythema, swelling, or discharge.  TMs normal bilaterally, oropharynx clear, moist, with no exudate.   Neck: Supple, thyroid normal, no JVD, no cervical adenopathy.  Respiratory: Respiratory effort normal, breath sounds clear A&P, no wheeze, rhonchi or rales noted.  No retractions, no accessory muscle usage Cardio: RRR with no MRGs. No noted edema.  Abdomen: Soft, + BS.  Non tender, no guarding, rebound, hernias, masses. Musculoskeletal: Full ROM, 5/5 strength, Normal gait Skin: Warm, dry without rashes, lesions, ecchymosis.  Neuro: Awake and oriented X 3, Cranial nerves intact. No cerebellar symptoms.  Psych: normal affect, Insight and Judgment appropriate.    Vicie Mutters, PA-C 8:47 AM [facility]

## 2017-05-30 ENCOUNTER — Ambulatory Visit (INDEPENDENT_AMBULATORY_CARE_PROVIDER_SITE_OTHER): Payer: BLUE CROSS/BLUE SHIELD | Admitting: Physician Assistant

## 2017-05-30 ENCOUNTER — Encounter: Payer: Self-pay | Admitting: Physician Assistant

## 2017-05-30 VITALS — BP 132/74 | HR 78 | Temp 97.7°F | Resp 16 | Ht 69.0 in | Wt 252.0 lb

## 2017-05-30 DIAGNOSIS — E1122 Type 2 diabetes mellitus with diabetic chronic kidney disease: Secondary | ICD-10-CM

## 2017-05-30 DIAGNOSIS — E782 Mixed hyperlipidemia: Secondary | ICD-10-CM

## 2017-05-30 DIAGNOSIS — N183 Chronic kidney disease, stage 3 unspecified: Secondary | ICD-10-CM

## 2017-05-30 DIAGNOSIS — E291 Testicular hypofunction: Secondary | ICD-10-CM

## 2017-05-30 DIAGNOSIS — E119 Type 2 diabetes mellitus without complications: Secondary | ICD-10-CM

## 2017-05-30 DIAGNOSIS — N182 Chronic kidney disease, stage 2 (mild): Secondary | ICD-10-CM

## 2017-05-30 DIAGNOSIS — Z79899 Other long term (current) drug therapy: Secondary | ICD-10-CM | POA: Diagnosis not present

## 2017-05-30 DIAGNOSIS — Z794 Long term (current) use of insulin: Secondary | ICD-10-CM | POA: Diagnosis not present

## 2017-05-30 DIAGNOSIS — I10 Essential (primary) hypertension: Secondary | ICD-10-CM

## 2017-05-30 LAB — CBC WITH DIFFERENTIAL/PLATELET
Basophils Absolute: 0 cells/uL (ref 0–200)
Basophils Relative: 0 %
Eosinophils Absolute: 255 cells/uL (ref 15–500)
Eosinophils Relative: 5 %
HCT: 55.7 % — ABNORMAL HIGH (ref 38.5–50.0)
Hemoglobin: 18.3 g/dL — ABNORMAL HIGH (ref 13.2–17.1)
Lymphocytes Relative: 33 %
Lymphs Abs: 1683 cells/uL (ref 850–3900)
MCH: 27.9 pg (ref 27.0–33.0)
MCHC: 32.9 g/dL (ref 32.0–36.0)
MCV: 85 fL (ref 80.0–100.0)
MPV: 9.8 fL (ref 7.5–12.5)
Monocytes Absolute: 714 cells/uL (ref 200–950)
Monocytes Relative: 14 %
Neutro Abs: 2448 cells/uL (ref 1500–7800)
Neutrophils Relative %: 48 %
Platelets: 279 10*3/uL (ref 140–400)
RBC: 6.55 MIL/uL — ABNORMAL HIGH (ref 4.20–5.80)
RDW: 15.1 % — ABNORMAL HIGH (ref 11.0–15.0)
WBC: 5.1 10*3/uL (ref 3.8–10.8)

## 2017-05-30 LAB — TSH: TSH: 1.39 mIU/L (ref 0.40–4.50)

## 2017-05-30 MED ORDER — TESTOSTERONE CYPIONATE 200 MG/ML IM SOLN
200.0000 mg | INTRAMUSCULAR | Status: DC
  Administered 2017-05-30: 200 mg via INTRAMUSCULAR

## 2017-05-30 NOTE — Patient Instructions (Signed)
Somogyi effect The brain needs two things: oxygen and sugar. If the blood sugar level drops too low in the early morning hours, hormones (such as growth hormone, cortisol, and catecholamines) are released to make sure you brain can still function. These help reverse the low blood sugar level but may lead to blood sugar levels that are higher than normal in the morning. This is common for patient that take insulin at night or do not ear regular snacks.  Please schedule to get up in the middle of the night to check your blood sugar. ALWAYS TAKE INSULIN WITH FOOD  This may be happening to you. Please eat a high protein night time snack and we will be decreasing your night time insulin as follows: 25-30 in the morning and always 5 less.      Bad carbs also include fruit juice, alcohol, and sweet tea. These are empty calories that do not signal to your brain that you are full.   Please remember the good carbs are still carbs which convert into sugar. So please measure them out no more than 1/2-1 cup of rice, oatmeal, pasta, and beans  Veggies are however free foods! Pile them on.   Not all fruit is created equal. Please see the list below, the fruit at the bottom is higher in sugars than the fruit at the top. Please avoid all dried fruits.

## 2017-05-31 LAB — MAGNESIUM: Magnesium: 2.1 mg/dL (ref 1.5–2.5)

## 2017-05-31 LAB — LIPID PANEL
Cholesterol: 156 mg/dL (ref <200)
HDL: 25 mg/dL — ABNORMAL LOW (ref >40)
LDL Cholesterol: 67 mg/dL (ref <100)
Total CHOL/HDL Ratio: 6.2 Ratio — ABNORMAL HIGH (ref <5.0)
Triglycerides: 318 mg/dL — ABNORMAL HIGH (ref <150)
VLDL: 64 mg/dL — ABNORMAL HIGH (ref <30)

## 2017-05-31 LAB — HEPATIC FUNCTION PANEL
ALT: 16 U/L (ref 9–46)
AST: 17 U/L (ref 10–35)
Albumin: 4.2 g/dL (ref 3.6–5.1)
Alkaline Phosphatase: 77 U/L (ref 40–115)
Bilirubin, Direct: 0.1 mg/dL (ref <=0.2)
Indirect Bilirubin: 0.3 mg/dL (ref 0.2–1.2)
Total Bilirubin: 0.4 mg/dL (ref 0.2–1.2)
Total Protein: 7.5 g/dL (ref 6.1–8.1)

## 2017-05-31 LAB — BASIC METABOLIC PANEL WITH GFR
BUN: 19 mg/dL (ref 7–25)
CO2: 21 mmol/L (ref 20–31)
Calcium: 9.7 mg/dL (ref 8.6–10.3)
Chloride: 102 mmol/L (ref 98–110)
Creat: 1.36 mg/dL — ABNORMAL HIGH (ref 0.70–1.33)
GFR, Est African American: 68 mL/min (ref >=60)
GFR, Est Non African American: 59 mL/min — ABNORMAL LOW (ref >=60)
Glucose, Bld: 172 mg/dL — ABNORMAL HIGH (ref 65–99)
Potassium: 4.6 mmol/L (ref 3.5–5.3)
Sodium: 136 mmol/L (ref 135–146)

## 2017-05-31 LAB — HEMOGLOBIN A1C
Hgb A1c MFr Bld: 9.2 % — ABNORMAL HIGH (ref <5.7)
Mean Plasma Glucose: 217 mg/dL

## 2017-05-31 NOTE — Progress Notes (Signed)
LVM for pt to return office call for LAB results.

## 2017-06-06 ENCOUNTER — Ambulatory Visit (HOSPITAL_COMMUNITY): Admission: RE | Admit: 2017-06-06 | Payer: BLUE CROSS/BLUE SHIELD | Source: Ambulatory Visit

## 2017-06-06 NOTE — Progress Notes (Signed)
LVM for pt to return office call for LAB results.

## 2017-06-13 ENCOUNTER — Other Ambulatory Visit: Payer: Self-pay | Admitting: Internal Medicine

## 2017-06-14 ENCOUNTER — Other Ambulatory Visit: Payer: Self-pay | Admitting: Internal Medicine

## 2017-06-14 ENCOUNTER — Ambulatory Visit (INDEPENDENT_AMBULATORY_CARE_PROVIDER_SITE_OTHER): Payer: BLUE CROSS/BLUE SHIELD

## 2017-06-14 DIAGNOSIS — E291 Testicular hypofunction: Secondary | ICD-10-CM

## 2017-06-14 MED ORDER — TESTOSTERONE CYPIONATE 200 MG/ML IM SOLN
200.0000 mg | INTRAMUSCULAR | Status: DC
  Administered 2017-06-14: 200 mg via INTRAMUSCULAR

## 2017-06-14 NOTE — Telephone Encounter (Signed)
Please call Depo - Testosterone 

## 2017-06-14 NOTE — Progress Notes (Signed)
Patient here for a NV for Testosterone Cypionate injection 200 mg/ml 2 ml IM in the LEFT upper outer quadrant. Patient tolerated well and will return in 2 weeks for next injection.

## 2017-06-28 ENCOUNTER — Ambulatory Visit (INDEPENDENT_AMBULATORY_CARE_PROVIDER_SITE_OTHER): Payer: BLUE CROSS/BLUE SHIELD | Admitting: *Deleted

## 2017-06-28 DIAGNOSIS — E291 Testicular hypofunction: Secondary | ICD-10-CM

## 2017-06-28 MED ORDER — TESTOSTERONE CYPIONATE 200 MG/ML IM SOLN
400.0000 mg | Freq: Once | INTRAMUSCULAR | Status: AC
  Administered 2017-06-28: 400 mg via INTRAMUSCULAR

## 2017-06-28 NOTE — Progress Notes (Signed)
Patient here for a NV for Testosterone Cypionate 200 mg/ml 2 ml injection right upper outer quadrant IM. Patient tolerated well.

## 2017-07-12 ENCOUNTER — Ambulatory Visit (INDEPENDENT_AMBULATORY_CARE_PROVIDER_SITE_OTHER): Payer: BLUE CROSS/BLUE SHIELD

## 2017-07-12 DIAGNOSIS — E349 Endocrine disorder, unspecified: Secondary | ICD-10-CM

## 2017-07-12 MED ORDER — TESTOSTERONE CYPIONATE 200 MG/ML IM SOLN
200.0000 mg | INTRAMUSCULAR | Status: DC
  Administered 2017-07-12: 200 mg via INTRAMUSCULAR

## 2017-07-12 NOTE — Progress Notes (Signed)
Patient here for a NV for Testosterone Cypionate 200 mg/ml 2 ml injection left upper outer quadrant IM. Patient tolerated well.

## 2017-07-21 ENCOUNTER — Other Ambulatory Visit: Payer: Self-pay | Admitting: Physician Assistant

## 2017-07-26 ENCOUNTER — Ambulatory Visit (INDEPENDENT_AMBULATORY_CARE_PROVIDER_SITE_OTHER): Payer: BLUE CROSS/BLUE SHIELD | Admitting: *Deleted

## 2017-07-26 DIAGNOSIS — E291 Testicular hypofunction: Secondary | ICD-10-CM | POA: Diagnosis not present

## 2017-07-26 MED ORDER — TESTOSTERONE CYPIONATE 200 MG/ML IM SOLN: 400.0000 mg | Freq: Once | INTRAMUSCULAR | Status: DC

## 2017-07-26 NOTE — Progress Notes (Signed)
Patient here for a NV for his Testosterone Cypionate 200 mg/ml injection 2 ml IM in his right upper outer quadrant. Patient tolerated well.

## 2017-08-02 DIAGNOSIS — N179 Acute kidney failure, unspecified: Secondary | ICD-10-CM | POA: Diagnosis not present

## 2017-08-02 DIAGNOSIS — S37019A Minor contusion of unspecified kidney, initial encounter: Secondary | ICD-10-CM | POA: Diagnosis not present

## 2017-08-02 DIAGNOSIS — I10 Essential (primary) hypertension: Secondary | ICD-10-CM | POA: Diagnosis not present

## 2017-08-02 DIAGNOSIS — E119 Type 2 diabetes mellitus without complications: Secondary | ICD-10-CM | POA: Diagnosis not present

## 2017-08-09 ENCOUNTER — Ambulatory Visit (INDEPENDENT_AMBULATORY_CARE_PROVIDER_SITE_OTHER): Payer: BLUE CROSS/BLUE SHIELD | Admitting: *Deleted

## 2017-08-09 DIAGNOSIS — E291 Testicular hypofunction: Secondary | ICD-10-CM | POA: Diagnosis not present

## 2017-08-09 MED ORDER — TESTOSTERONE CYPIONATE 200 MG/ML IM SOLN: 400.0000 mg | Freq: Once | INTRAMUSCULAR | Status: DC

## 2017-08-09 NOTE — Progress Notes (Signed)
Patient here for a NV for his Testosterone Cypionate 200 mg/ml 2 ml IM injection in the left upper outer quadrant  Patient tolerated well and will return in 2 weeks for the next injection.

## 2017-08-23 ENCOUNTER — Ambulatory Visit (INDEPENDENT_AMBULATORY_CARE_PROVIDER_SITE_OTHER): Payer: BLUE CROSS/BLUE SHIELD

## 2017-08-23 DIAGNOSIS — E349 Endocrine disorder, unspecified: Secondary | ICD-10-CM | POA: Diagnosis not present

## 2017-08-23 MED ORDER — TESTOSTERONE CYPIONATE 200 MG/ML IM SOLN
200.0000 mg | INTRAMUSCULAR | Status: DC
  Administered 2017-08-23: 200 mg via INTRAMUSCULAR

## 2017-08-23 NOTE — Progress Notes (Signed)
Patient here for a NV for his Testosterone Cypionate 200 mg/ml 2 ml IM injection in the right upper outer quadrant  Patient tolerated well and will return in 2 weeks for the next injection.

## 2017-08-26 ENCOUNTER — Ambulatory Visit (HOSPITAL_COMMUNITY): Admission: RE | Admit: 2017-08-26 | Payer: BLUE CROSS/BLUE SHIELD | Source: Ambulatory Visit

## 2017-09-02 ENCOUNTER — Other Ambulatory Visit: Payer: Self-pay | Admitting: *Deleted

## 2017-09-02 MED ORDER — EZETIMIBE 10 MG PO TABS: 10.0000 mg | ORAL_TABLET | Freq: Every day | ORAL | 1 refills | Status: DC

## 2017-09-06 ENCOUNTER — Ambulatory Visit (INDEPENDENT_AMBULATORY_CARE_PROVIDER_SITE_OTHER): Payer: BLUE CROSS/BLUE SHIELD

## 2017-09-06 DIAGNOSIS — E349 Endocrine disorder, unspecified: Secondary | ICD-10-CM

## 2017-09-06 MED ORDER — TESTOSTERONE CYPIONATE 200 MG/ML IM SOLN
200.0000 mg | INTRAMUSCULAR | Status: DC
  Administered 2017-09-06: 200 mg via INTRAMUSCULAR

## 2017-09-06 NOTE — Progress Notes (Signed)
Patient here for a NV for his Testosterone Cypionate 200 mg/ml 2 ml IM injection in the LEFT upper outer quadrant Patient tolerated well and will return in 2 weeks for the next injection.

## 2017-09-16 ENCOUNTER — Other Ambulatory Visit: Payer: Self-pay | Admitting: Internal Medicine

## 2017-09-20 ENCOUNTER — Ambulatory Visit (INDEPENDENT_AMBULATORY_CARE_PROVIDER_SITE_OTHER): Payer: BLUE CROSS/BLUE SHIELD | Admitting: *Deleted

## 2017-09-20 DIAGNOSIS — E291 Testicular hypofunction: Secondary | ICD-10-CM | POA: Diagnosis not present

## 2017-09-20 MED ORDER — TESTOSTERONE CYPIONATE 200 MG/ML IM SOLN
400.0000 mg | Freq: Once | INTRAMUSCULAR | Status: AC
Start: 2017-09-20 — End: 2017-09-20
  Administered 2017-09-20: 400 mg via INTRAMUSCULAR

## 2017-09-20 NOTE — Progress Notes (Signed)
Patient here for a NV for Testosterone Cypionate 200 mg/ml 2 ml IM injection in the right upper outer quadrant. Patient tolerated well.

## 2017-09-25 DIAGNOSIS — Z23 Encounter for immunization: Secondary | ICD-10-CM | POA: Diagnosis not present

## 2017-10-04 ENCOUNTER — Ambulatory Visit: Payer: Self-pay

## 2017-10-11 ENCOUNTER — Encounter: Payer: Self-pay | Admitting: Physician Assistant

## 2017-10-30 ENCOUNTER — Encounter: Payer: Self-pay | Admitting: Physician Assistant

## 2017-11-20 ENCOUNTER — Other Ambulatory Visit: Payer: Self-pay | Admitting: *Deleted

## 2017-11-20 MED ORDER — GLUCOSE BLOOD VI STRP: ORAL_STRIP | 12 refills | Status: DC

## 2017-11-20 MED ORDER — GLUCOSE BLOOD VI STRP: ORAL_STRIP | 3 refills | Status: AC

## 2017-11-25 ENCOUNTER — Other Ambulatory Visit: Payer: Self-pay | Admitting: Internal Medicine

## 2017-11-25 DIAGNOSIS — I10 Essential (primary) hypertension: Secondary | ICD-10-CM

## 2017-11-25 MED ORDER — AMLODIPINE BESYLATE 10 MG PO TABS: ORAL_TABLET | ORAL | 0 refills | Status: DC

## 2017-12-31 IMAGING — CR DG SHOULDER 2+V*L*
3 series · 3 of 3 positions shown · non-contrast
Comparison: None in PACs

CLINICAL DATA: Status post fall left shoulder today ; pain and
limited range of motion

EXAM:
LEFT SHOULDER - 2+ VIEW

[view not recorded (1 of 3)]
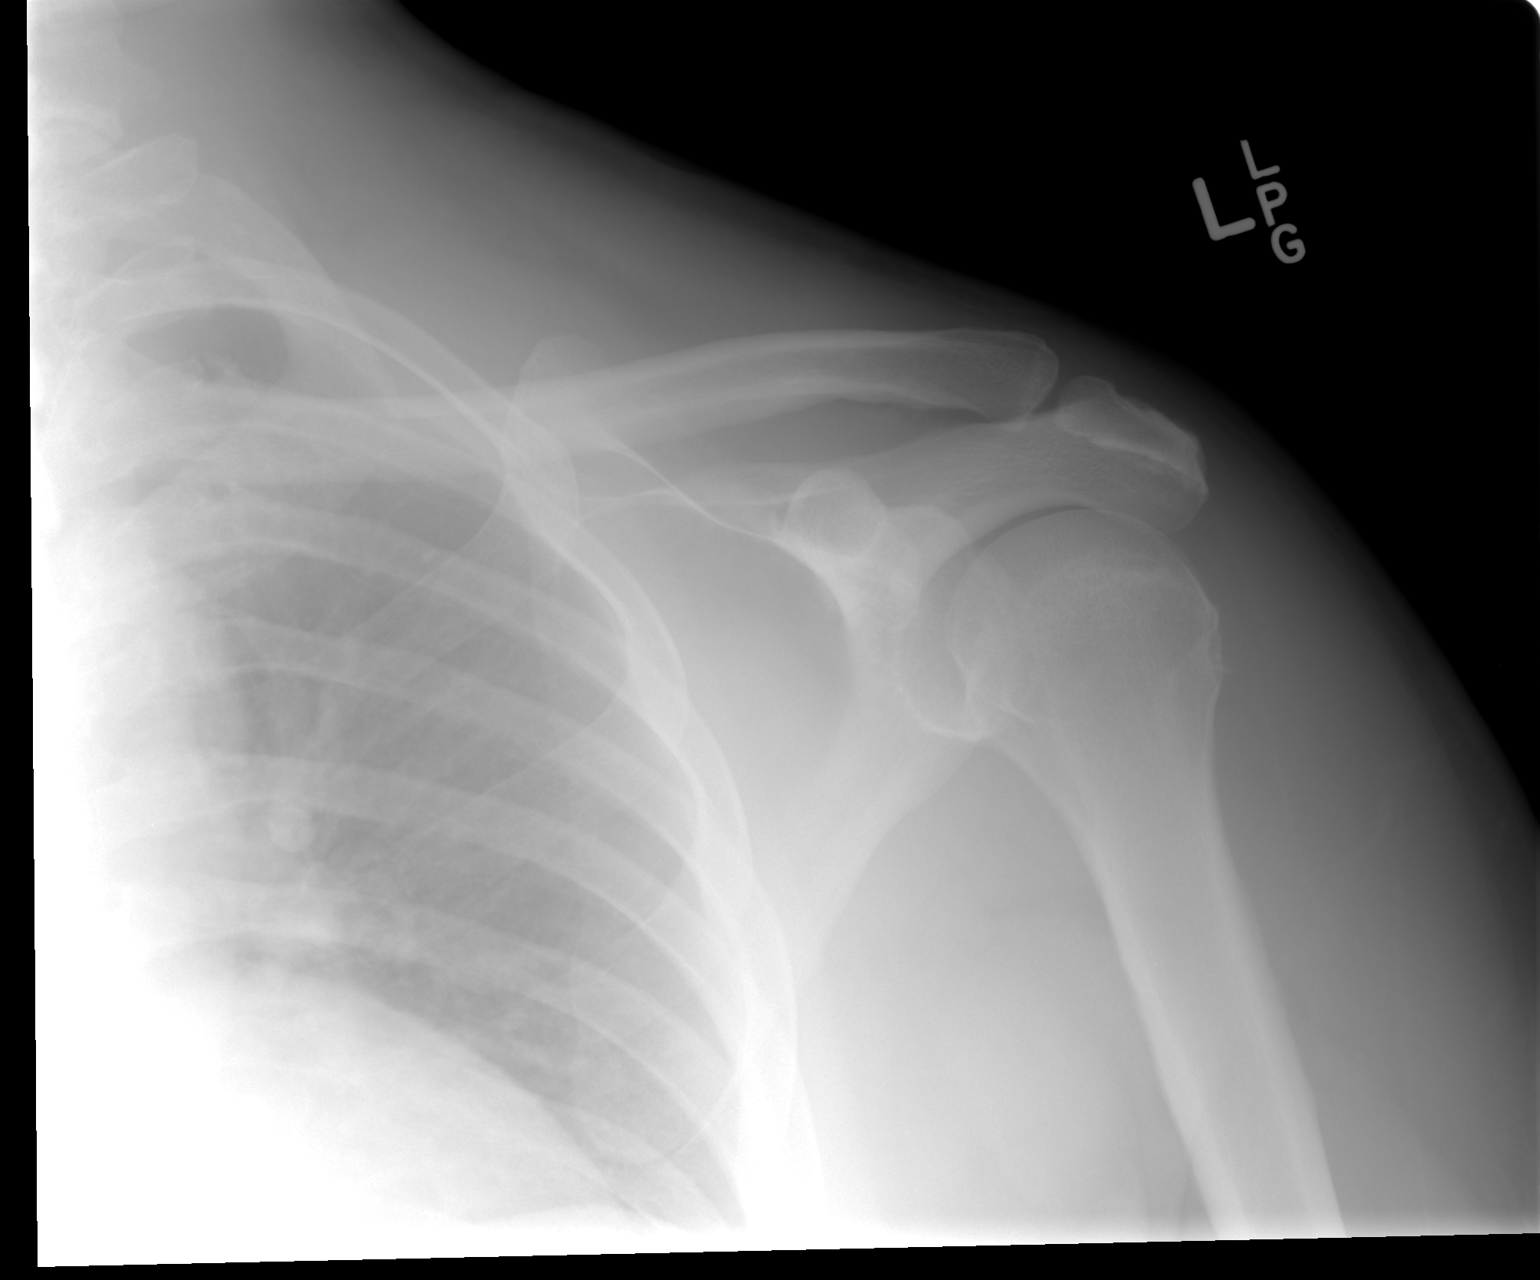

[view not recorded (2 of 3)]
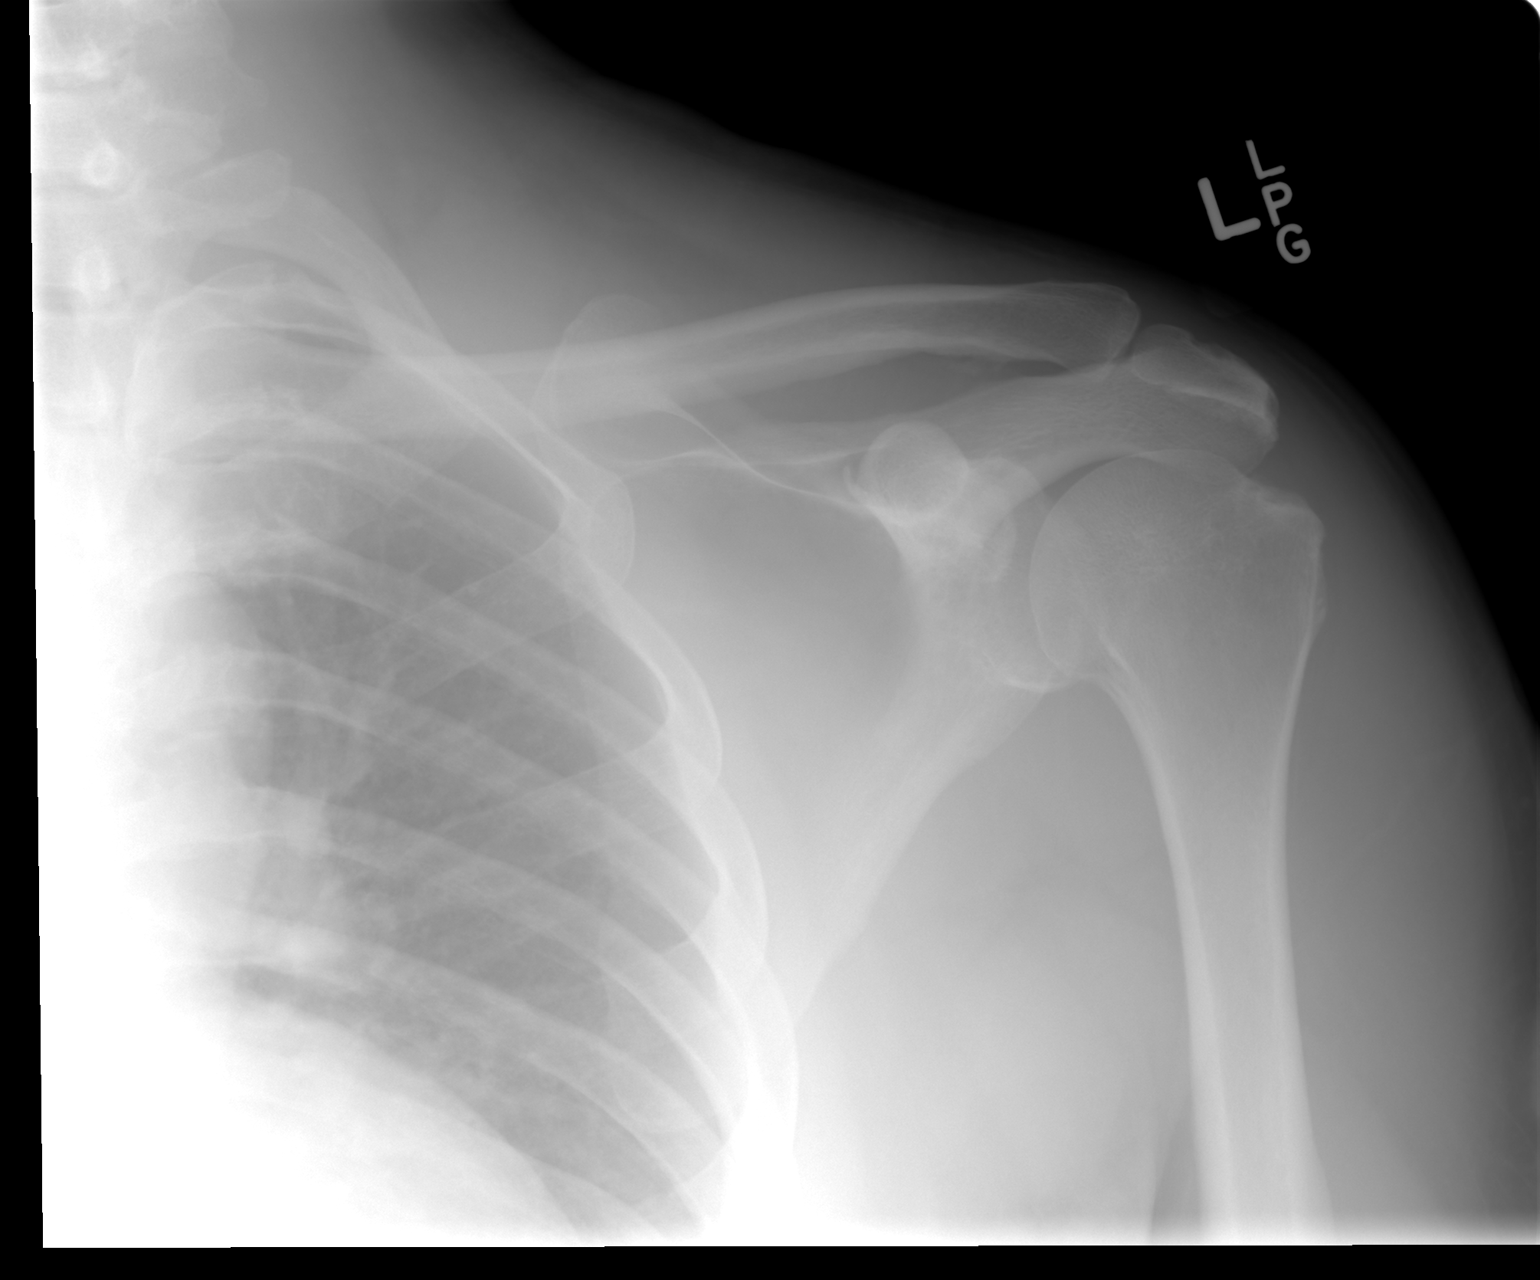

[view not recorded (3 of 3)]
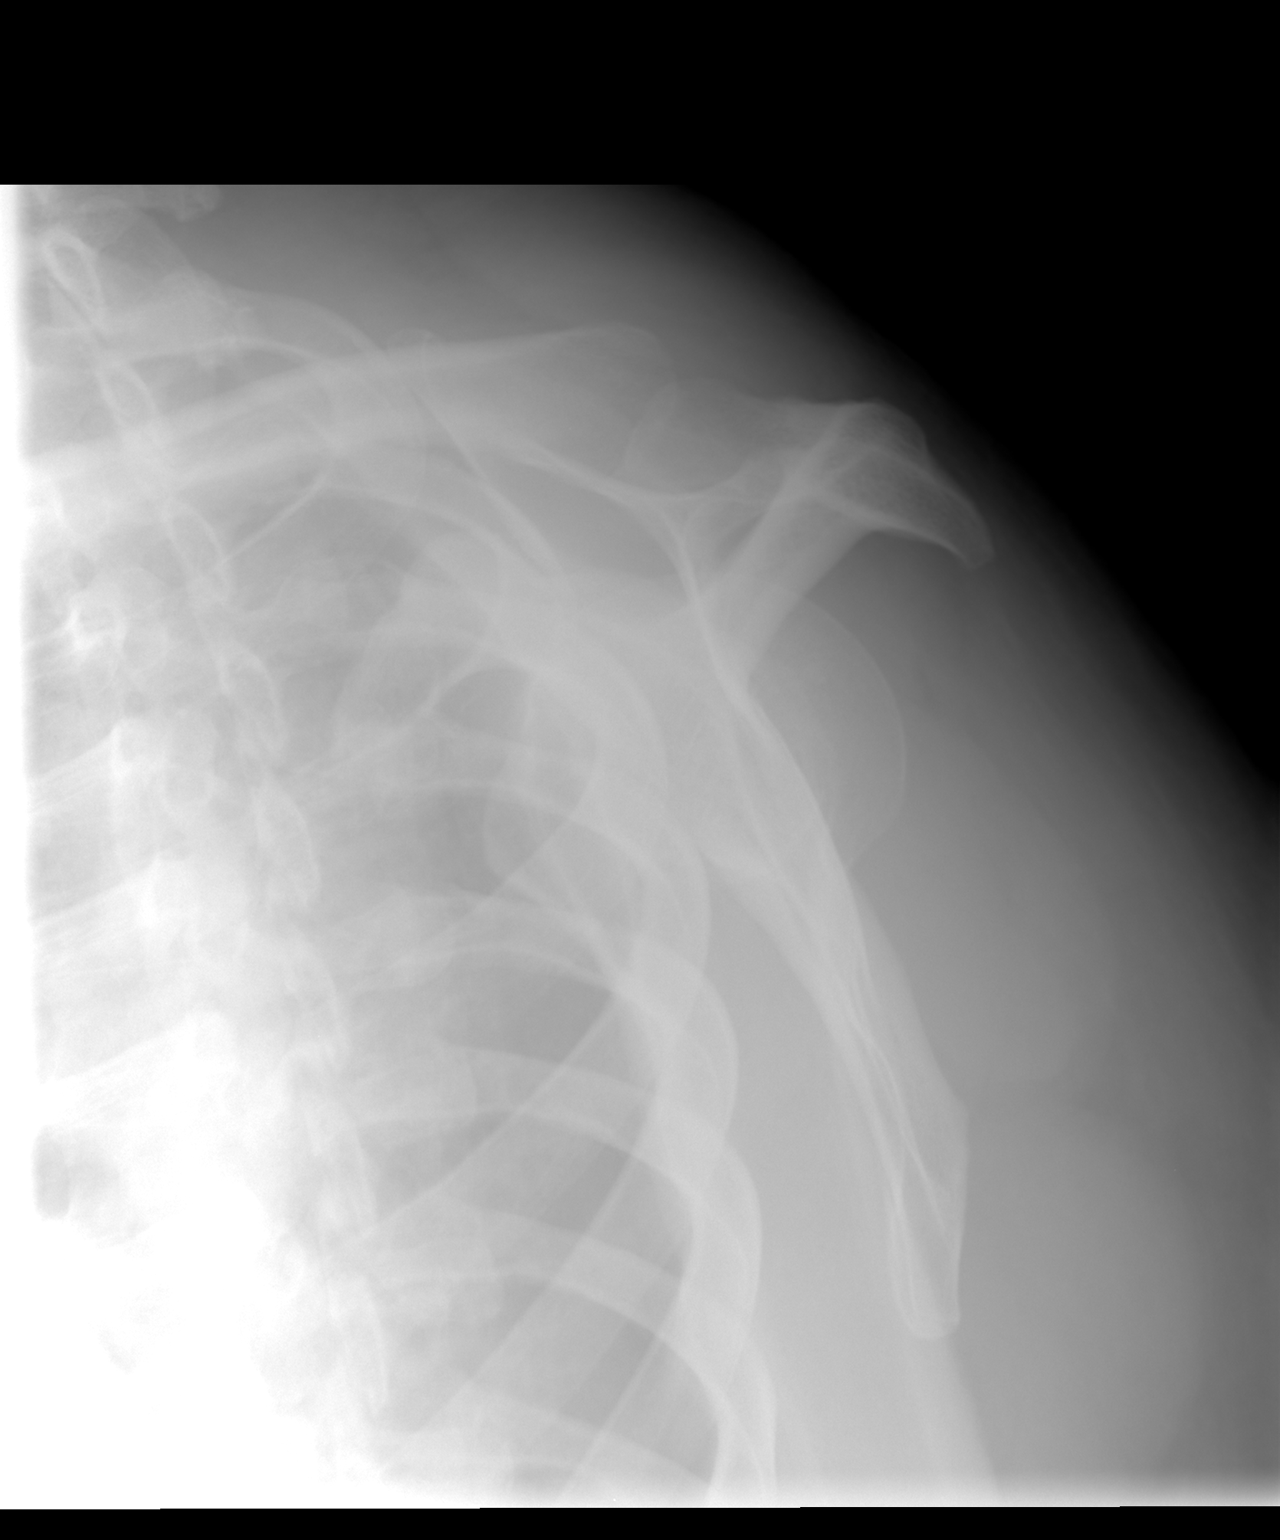

[3 of 3 positions shown; findings below may reference images not displayed]

FINDINGS: The bones of the left shoulder are adequately mineralized. The
glenohumeral and AC joints appear normal. The observed portions of
the left clavicle and upper left ribs are normal.
IMPRESSION: There is no acute bony abnormality of the left shoulder demonstrated
on this two-view series.

## 2018-01-02 ENCOUNTER — Other Ambulatory Visit: Payer: Self-pay | Admitting: Internal Medicine

## 2018-01-02 DIAGNOSIS — I10 Essential (primary) hypertension: Secondary | ICD-10-CM

## 2018-01-04 ENCOUNTER — Other Ambulatory Visit: Payer: Self-pay | Admitting: Physician Assistant

## 2018-01-05 ENCOUNTER — Other Ambulatory Visit: Payer: Self-pay | Admitting: Internal Medicine

## 2018-01-06 ENCOUNTER — Encounter: Payer: Self-pay | Admitting: Physician Assistant

## 2018-01-06 NOTE — Progress Notes (Signed)
Complete Physical  Assessment and Plan:  Essential hypertension - continue medications, DASH diet, exercise and monitor at home. Call if greater than 130/80.  -     telmisartan (MICARDIS) 40 MG tablet; Take 1 tablet (40 mg total) by mouth daily. -     CBC with Differential/Platelet -     BASIC METABOLIC PANEL WITH GFR -     Hepatic function panel -     EKG 12-Lead -     TSH -     Urinalysis, Routine w reflex microscopic -     Microalbumin / creatinine urine ratio  Controlled type 2 diabetes mellitus with stage 3 chronic kidney disease, with long-term current use of insulin ([place]) Will add on arb, he is on basa every other day -     telmisartan (MICARDIS) 40 MG tablet; Take 1 tablet (40 mg total) by mouth daily. -     Hemoglobin A1c  Insulin-requiring or dependent type II diabetes mellitus ([place]) Discussed general issues about diabetes pathophysiology and management., Educational material distributed., Suggested low cholesterol diet., Encouraged aerobic exercise., Discussed foot care., Reminded to get yearly retinal exam. -     telmisartan (MICARDIS) 40 MG tablet; Take 1 tablet (40 mg total) by mouth daily. -     BASIC METABOLIC PANEL WITH GFR -     Hemoglobin A1c  Type 2 diabetes mellitus with stage 2 chronic kidney disease, with long-term current use of insulin (HCC) Discussed general issues about diabetes pathophysiology and management., Educational material distributed., Suggested low cholesterol diet., Encouraged aerobic exercise., Discussed foot care., Reminded to get yearly retinal exam. -     telmisartan (MICARDIS) 40 MG tablet; Take 1 tablet (40 mg total) by mouth daily. 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 -     Hemoglobin A1c  Mixed hyperlipidemia -continue medications, check lipids, decrease fatty foods, increase activity.   -     Lipid panel  Morbid obesity (BMI 36+) - follow up 3 months for progress monitoring - increase veggies, decrease carbs - long discussion about weight loss, diet, and exercise  Testosterone deficiency Hypogonadism- continue replacement therapy, check testosterone levels as needed.   Medication management -     Magnesium  [name] -     VITAMIN D 25 Hydroxy (Vit-D Deficiency, Fractures)  Obstructive sleep apnea-questioned Wear CPAP NIGHTLY!  Nonallergic rhinitis Continue allergy pill  BMI 36.0-36.9,adult - long discussion about weight loss, diet, and exercise -recommended diet heavy in fruits and veggies and low in animal meats, cheeses, and dairy products  Screening, anemia, deficiency, iron -     Iron,Total/Total Iron Binding Cap -     Vitamin B12  Screening PSA (prostate specific antigen) -     PSA  Discussed med's effects and SE's. Screening labs and tests as requested with regular follow-up as recommended. Over 40 minutes of exam, counseling, chart review and critical decision making was performed  HPI Patient presents for a complete physical.   Father in [place] had left nephrectomy due to renal cancer, he was there to care give for him and has gotten out of his routine. Dad is doing better, he is 45.    His blood pressure has been controlled at home, today their BP is BP: 138/76 He does not workout but tried to get 5000 steps a day.  He denies chest pain, shortness of breath, dizziness.  He is on cholesterol medication and denies myalgias. His cholesterol is at goal. The cholesterol last visit was:   Lab Results  Component Value Date   CHOL 156 05/30/2017   HDL 25 (L) 05/30/2017   LDLCALC 67 05/30/2017   TRIG 318 (H) 05/30/2017   CHOLHDL 6.2 (H) 05/30/2017   He has been working on diet and exercise for Diabetes with diabetic chronic kidney disease, he is on bASA every other day due to history of retroperitoneal hematoma, he is not on  ACE/ARB, he had cough with cozaar, he is on 70/30 insulin he is on 20 units and night time 20-25, he is on metformin 4 a day, he has checked sugar occ, average is 150's and denies paresthesia of the feet, polydipsia, polyuria and visual disturbances. Last A1C was:  Lab Results  Component Value Date   HGBA1C 9.2 (H) 05/30/2017   Last GFR: Lab Results  Component Value Date   GFRAA 68 05/30/2017   Patient is on Vitamin D supplement.   Lab Results  Component Value Date   VD25OH 23 09/12/2016     Last PSA was: Lab Results  Component Value Date   PSA 3.04 07/20/2015   BMI is Body mass index is 36.98 kg/m., he is working on diet and exercise. Wt Readings from Last 3 Encounters:  01/07/18 257 lb 11.2 oz (116.9 kg)  05/30/17 252 lb (114.3 kg)  09/12/16 247 lb (112 kg)   He has a history of testosterone deficiency and is on testosterone replacement. He states that the testosterone helps with his energy, libido, muscle mass. Lab Results  Component Value Date   TESTOSTERONE 320 02/01/2016     Current Medications:  Current Outpatient Medications on File Prior to Visit  Medication Sig  . amLODipine (NORVASC) 10 MG tablet Take 1 tablet daily for BP to last til scheduled office visit  . atorvastatin (LIPITOR) 20 MG tablet TAKE 1 TABLET (20 MG TOTAL) BY MOUTH DAILY.  . BD INSULIN SYRINGE ULTRAFINE 31G X 15/64" 0.5 ML MISC USE TWO PENS DAILY WITH INSULIN  . bisoprolol (ZEBETA) 10 MG tablet TAKE 1 TABLET BY MOUTH EVERY DAY  . ciclesonide (OMNARIS) 50 MCG/ACT nasal spray Place 2 sprays into both nostrils daily as needed for allergies.   . clotrimazole-betamethasone (LOTRISONE) cream APPLY 3 TIMES A DAY AS NEEDED  . ezetimibe (ZETIA) 10 MG tablet Take 1 tablet (10 mg total) by mouth daily.  . fluconazole (DIFLUCAN) 150 MG tablet TAKE 1 TABLET EVERY DAY WITH FOOD  . glucose blood (FREESTYLE LITE) test strip Check blood sugar 3 times a day or as directed. [id number]  . NOVOLIN 70/30 (70-30) 100  UNIT/ML injection INJECT UP TO 30 UNITS IN MORNING WITH FOOD & UP TO 30 UNITS WITH EVENING MEAL,ADJUST TO SUGARS  . tadalafil (CIALIS) 20 MG tablet Take 1/2 to 1 tablet every 2-3 days as needed (Patient taking differently: Take 10-20 mg by mouth daily as needed for erectile dysfunction. Take 1/2 to 1 tablet every 2-3 days as needed)  . testosterone cypionate (DEPOTESTOSTERONE CYPIONATE) 200 MG/ML injection INJECT 2 MLS IM EVERY 2 WEEKS AS DIRECTED  . Vitamin D, Ergocalciferol, (DRISDOL) 50000 units CAPS capsule TAKE ONE CAPSULE EVERY DAY OR AS DIRECTED  . metFORMIN (GLUCOPHAGE XR) 500 MG 24 hr tablet Take 2 tablets 2 x day for Diabetes   Current Facility-Administered Medications on File Prior to Visit  Medication  . testosterone cypionate (DEPOTESTOSTERONE  CYPIONATE) injection 200 mg  . testosterone cypionate (DEPOTESTOSTERONE CYPIONATE) injection 200 mg  . testosterone cypionate (DEPOTESTOSTERONE CYPIONATE) injection 200 mg  . testosterone cypionate (DEPOTESTOSTERONE CYPIONATE) injection 400 mg  . testosterone cypionate (DEPOTESTOSTERONE CYPIONATE) injection 400 mg   Allergies:  Allergies  Allergen Reactions  . Ppd [Tuberculin Purified Protein Derivative] Other (See Comments)    POSITIVE REACTION IN PAST 11/21/11 CXR NEG   Health Maintenance:  Immunization History  Administered Date(s) Administered  . Influenza Split 10/10/2013  . Influenza Whole 09/08/2012  . Pneumococcal-Unspecified 12/31/2011  . Tdap 12/19/2009    Tetanus:2011 Pneumovax: 2003 Prevnar 13: Flu vaccine: 2018 Zostavax:  DEXA: Colonoscopy: 2007 Dr. Oletta Lamas, due 2017 states he had another, not in epic Dr. Amedeo Plenty? 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   Surgical History:  He  has a past surgical history that includes Wisdom tooth extraction and Hernia repair.   Family History:  His family history includes Cancer (age of onset: 110) in his father; Diabetes in his brother, father, and mother; Hyperlipidemia in his father and mother; Hypertension in his brother, father, and mother.   Social History:   reports that he quit smoking about 17 years ago. His smoking use included cigarettes. He has a 2.40 pack-year smoking history. he has never used smokeless tobacco. He reports that he drinks alcohol. He reports that he does not use drugs.   Review of Systems:  Review of Systems  Constitutional: Negative.   HENT: Negative.   Eyes: Negative.   Respiratory: Negative.   Cardiovascular: Negative.   Gastrointestinal: Negative.   Genitourinary: Negative.   Musculoskeletal: Negative.   Skin: Negative.   Neurological: Negative.   Endo/Heme/Allergies: Negative.   Psychiatric/Behavioral: Negative.     Physical Exam: Estimated body mass index is 36.98 kg/m as calculated from the following:   Height as of this encounter: 5\' 10"  (1.778 m).   Weight as of this encounter: 257 lb 11.2 oz (116.9 kg). BP 138/76   Pulse 71   Temp 97.6 F (36.4 C)   Resp 16   Ht 5\' 10"  (1.778 m)   Wt 257 lb 11.2 oz (116.9 kg)   SpO2 96%   BMI 36.98 kg/m  General Appearance: Well nourished, in no apparent distress.  Eyes: PERRLA, EOMs, conjunctiva no swelling or erythema, normal fundi and vessels.  Sinuses: No Frontal/maxillary tenderness  ENT/Mouth: Ext aud canals clear, normal light reflex with TMs without erythema, bulging. Good  dentition. No erythema, swelling, or exudate on post pharynx. Tonsils not swollen or erythematous. Hearing normal.  Neck: Supple, thyroid normal. No bruits  Respiratory: Respiratory effort normal, BS equal bilaterally without rales, rhonchi, wheezing or stridor.  Cardio: RRR without murmurs, prominent S2, no rubs or gallops. Brisk peripheral pulses without edema.  Chest: symmetric, with normal excursions and percussion.  Abdomen: Soft, nontender, no guarding, rebound, hernias, masses, or organomegaly.  Lymphatics: Non tender without lymphadenopathy.  Genitourinary: defer Musculoskeletal: Full ROM all peripheral extremities,5/5 strength, and normal gait.  Skin: Warm, dry without rashes, lesions, ecchymosis. Neuro: Cranial nerves intact,  reflexes equal bilaterally. Normal muscle tone, no cerebellar symptoms. Sensation intact.  Psych: Awake and oriented X 3, normal affect, Insight and Judgment appropriate.   EKG: WNL no changes. AORTA SCAN: WNL  Vicie Mutters 11:52 AM [facility]

## 2018-01-07 ENCOUNTER — Ambulatory Visit: Payer: BLUE CROSS/BLUE SHIELD | Admitting: Physician Assistant

## 2018-01-07 ENCOUNTER — Encounter: Payer: Self-pay | Admitting: Physician Assistant

## 2018-01-07 VITALS — BP 138/76 | HR 71 | Temp 97.6°F | Resp 16 | Ht 70.0 in | Wt 257.7 lb

## 2018-01-07 DIAGNOSIS — Z6836 Body mass index (BMI) 36.0-36.9, adult: Secondary | ICD-10-CM

## 2018-01-07 DIAGNOSIS — Z130 Encounter for screening for diseases of the blood and blood-forming organs and certain disorders involving the immune mechanism: Secondary | ICD-10-CM | POA: Diagnosis not present

## 2018-01-07 DIAGNOSIS — I10 Essential (primary) hypertension: Secondary | ICD-10-CM

## 2018-01-07 DIAGNOSIS — N183 Chronic kidney disease, stage 3 unspecified: Secondary | ICD-10-CM

## 2018-01-07 DIAGNOSIS — Z125 Encounter for screening for malignant neoplasm of prostate: Secondary | ICD-10-CM

## 2018-01-07 DIAGNOSIS — E559 Vitamin D deficiency, unspecified: Secondary | ICD-10-CM | POA: Diagnosis not present

## 2018-01-07 DIAGNOSIS — N182 Chronic kidney disease, stage 2 (mild): Secondary | ICD-10-CM

## 2018-01-07 DIAGNOSIS — J310 Chronic rhinitis: Secondary | ICD-10-CM

## 2018-01-07 DIAGNOSIS — N521 Erectile dysfunction due to diseases classified elsewhere: Secondary | ICD-10-CM

## 2018-01-07 DIAGNOSIS — Z0000 Encounter for general adult medical examination without abnormal findings: Secondary | ICD-10-CM | POA: Diagnosis not present

## 2018-01-07 DIAGNOSIS — Z136 Encounter for screening for cardiovascular disorders: Secondary | ICD-10-CM | POA: Diagnosis not present

## 2018-01-07 DIAGNOSIS — E119 Type 2 diabetes mellitus without complications: Secondary | ICD-10-CM

## 2018-01-07 DIAGNOSIS — E782 Mixed hyperlipidemia: Secondary | ICD-10-CM

## 2018-01-07 DIAGNOSIS — Z79899 Other long term (current) drug therapy: Secondary | ICD-10-CM | POA: Diagnosis not present

## 2018-01-07 DIAGNOSIS — E1159 Type 2 diabetes mellitus with other circulatory complications: Secondary | ICD-10-CM

## 2018-01-07 DIAGNOSIS — E1122 Type 2 diabetes mellitus with diabetic chronic kidney disease: Secondary | ICD-10-CM

## 2018-01-07 DIAGNOSIS — E349 Endocrine disorder, unspecified: Secondary | ICD-10-CM

## 2018-01-07 DIAGNOSIS — G4733 Obstructive sleep apnea (adult) (pediatric): Secondary | ICD-10-CM

## 2018-01-07 DIAGNOSIS — Z794 Long term (current) use of insulin: Secondary | ICD-10-CM

## 2018-01-07 MED ORDER — TELMISARTAN 40 MG PO TABS: 40.0000 mg | ORAL_TABLET | Freq: Every day | ORAL | 6 refills | Status: DC

## 2018-01-07 NOTE — Patient Instructions (Addendum)
Will start you on ARB for kidney protection, start on micardis 40mg , break in half at first and monitor BP   Your A1C is a measure of your sugar over the past 3 months and is not affected by what you have eaten over the past few days. Diabetes increases your chances of stroke and heart attack over 300 % and is the leading cause of blindness and kidney failure in the [place]. Please make sure you decrease bad carbs like white bread, white rice, potatoes, corn, soft drinks, pasta, cereals, refined sugars, sweet tea, dried fruits, and fruit juice. Good carbs are okay to eat in moderation like sweet potatoes, brown rice, whole grain pasta/bread, most fruit (except dried fruit) and you can eat as many veggies as you want.   Greater than 6.5 is considered diabetic. Between 6.4 and 5.7 is prediabetic If your A1C is less than 5.7 you are NOT diabetic.  Targets for Glucose Readings: Time of Check Target for patients WITHOUT Diabetes Target for DIABETICS  Before Meals Less than 100  less than 150  Two hours after meals Less than 200  Less than 250   Somogyi effect The brain needs two things: oxygen and sugar. If the blood sugar level drops too low in the early morning hours, hormones (such as growth hormone, cortisol, and catecholamines) are released to make sure you brain can still function. These help reverse the low blood sugar level but may lead to blood sugar levels that are higher than normal in the morning. This is common for patient that take insulin at night or do not ear regular snacks.  Please schedule to get up in the middle of the night to check your blood sugar.   This may be happening to you. Please eat a high protein night time snack and we will be decreasing your night time insulin as follows:  Take more insulin in the morning and less at night Can try 25 and 20, adjust accordingly.  Take the insulin WITH food  Diabetes is a very complicated disease...lets simplify it.  An easy way  to look at it to understand the complications is if you think of the extra sugar floating in your blood stream as glass shards floating through your blood stream.    Diabetes affects your small vessels first: 1) The glass shards (sugar) scraps down the tiny blood vessels in your eyes and lead to diabetic retinopathy, the leading cause of blindness in the [place]. Diabetes is the leading cause of newly diagnosed adult (2 to 56 years of age) blindness in the [place].  2) The glass shards scratches down the tiny vessels of your legs leading to nerve damage called neuropathy and can lead to amputations of your feet. More than 60% of all non-traumatic amputations of lower limbs occur in people with diabetes.  3) Over time the small vessels in your brain are shredded and closed off, individually this does not cause any problems but over a long period of time many of the small vessels being blocked can lead to Vascular Dementia.   4) Your kidney's are a filter system and have a "net" that keeps certain things in the body and lets bad things out. Sugar shreds this net and leads to kidney damage and eventually failure. Decreasing the sugar that is destroying the net and certain blood pressure medications can help stop or decrease progression of kidney disease. Diabetes was the primary cause of kidney failure in 44 percent of all  new cases in 2011.  5) Diabetes also destroys the small vessels in your penis that lead to erectile dysfunction. Eventually the vessels are so damaged that you may not be responsive to cialis or viagra.   Diabetes and your large vessels: Your larger vessels consist of your coronary arteries in your heart and the carotid vessels to your brain. Diabetes or even increased sugars put you at 300% increased risk of heart attack and stroke and this is why.. The sugar scrapes down your large blood vessels and your body sees this as an internal injury and tries to repair itself. Just like  you get a scab on your skin, your platelets will stick to the blood vessel wall trying to heal it. This is why we have diabetics on low dose aspirin daily, this prevents the platelets from sticking and can prevent plaque formation. In addition, your body takes cholesterol and tries to shove it into the open wound. This is why we want your LDL, or bad cholesterol, below 70.   The combination of platelets and cholesterol over 5-10 years forms plaque that can break off and cause a heart attack or stroke.   PLEASE REMEMBER:  Diabetes is preventable! Up to 27 percent of complications and morbidities among individuals with type 2 diabetes can be prevented, delayed, or effectively treated and minimized with regular visits to a health professional, appropriate monitoring and medication, and a healthy diet and lifestyle.      Bad carbs also include fruit juice, alcohol, and sweet tea. These are empty calories that do not signal to your brain that you are full.   Please remember the good carbs are still carbs which convert into sugar. So please measure them out no more than 1/2-1 cup of rice, oatmeal, pasta, and beans  Veggies are however free foods! Pile them on.   Not all fruit is created equal. Please see the list below, the fruit at the bottom is higher in sugars than the fruit at the top. Please avoid all dried fruits.

## 2018-01-08 ENCOUNTER — Other Ambulatory Visit: Payer: Self-pay | Admitting: Internal Medicine

## 2018-01-08 LAB — CBC WITH DIFFERENTIAL/PLATELET
Basophils Absolute: 22 cells/uL (ref 0–200)
Basophils Relative: 0.3 %
Eosinophils Absolute: 234 cells/uL (ref 15–500)
Eosinophils Relative: 3.2 %
HCT: 47.7 % (ref 38.5–50.0)
Hemoglobin: 15.7 g/dL (ref 13.2–17.1)
Lymphs Abs: 2431 cells/uL (ref 850–3900)
MCH: 26.8 pg — ABNORMAL LOW (ref 27.0–33.0)
MCHC: 32.9 g/dL (ref 32.0–36.0)
MCV: 81.4 fL (ref 80.0–100.0)
MPV: 9.9 fL (ref 7.5–12.5)
Monocytes Relative: 9.5 %
Neutro Abs: 3920 cells/uL (ref 1500–7800)
Neutrophils Relative %: 53.7 %
Platelets: 315 10*3/uL (ref 140–400)
RBC: 5.86 10*6/uL — ABNORMAL HIGH (ref 4.20–5.80)
RDW: 14 % (ref 11.0–15.0)
Total Lymphocyte: 33.3 %
WBC mixed population: 694 cells/uL (ref 200–950)
WBC: 7.3 10*3/uL (ref 3.8–10.8)

## 2018-01-08 LAB — HEPATIC FUNCTION PANEL
AG Ratio: 1.4 (calc) (ref 1.0–2.5)
ALT: 18 U/L (ref 9–46)
AST: 12 U/L (ref 10–35)
Albumin: 4.2 g/dL (ref 3.6–5.1)
Alkaline phosphatase (APISO): 92 U/L (ref 40–115)
Bilirubin, Direct: 0.1 mg/dL (ref 0.0–0.2)
Globulin: 3 g/dL (calc) (ref 1.9–3.7)
Indirect Bilirubin: 0.3 mg/dL (calc) (ref 0.2–1.2)
Total Bilirubin: 0.4 mg/dL (ref 0.2–1.2)
Total Protein: 7.2 g/dL (ref 6.1–8.1)

## 2018-01-08 LAB — URINALYSIS, ROUTINE W REFLEX MICROSCOPIC
Bilirubin Urine: NEGATIVE
Hgb urine dipstick: NEGATIVE
Ketones, ur: NEGATIVE
Leukocytes, UA: NEGATIVE
Nitrite: NEGATIVE
Protein, ur: NEGATIVE
Specific Gravity, Urine: 1.027 (ref 1.001–1.03)
pH: 5.5 (ref 5.0–8.0)

## 2018-01-08 LAB — BASIC METABOLIC PANEL WITH GFR
BUN: 21 mg/dL (ref 7–25)
CO2: 27 mmol/L (ref 20–32)
Calcium: 9.7 mg/dL (ref 8.6–10.3)
Chloride: 102 mmol/L (ref 98–110)
Creat: 1.22 mg/dL (ref 0.70–1.33)
GFR, Est African American: 77 mL/min/{1.73_m2} (ref >=60)
GFR, Est Non African American: 66 mL/min/{1.73_m2} (ref >=60)
Glucose, Bld: 229 mg/dL — ABNORMAL HIGH (ref 65–99)
Potassium: 4.5 mmol/L (ref 3.5–5.3)
Sodium: 138 mmol/L (ref 135–146)

## 2018-01-08 LAB — MICROALBUMIN / CREATININE URINE RATIO
Creatinine, Urine: 180 mg/dL (ref 20–320)
Microalb Creat Ratio: 34 mcg/mg creat — ABNORMAL HIGH (ref <30)
Microalb, Ur: 6.1 mg/dL

## 2018-01-08 LAB — LIPID PANEL
Cholesterol: 139 mg/dL (ref <200)
HDL: 29 mg/dL — ABNORMAL LOW (ref >40)
LDL Cholesterol (Calc): 75 mg/dL (calc)
Non-HDL Cholesterol (Calc): 110 mg/dL (calc) (ref <130)
Total CHOL/HDL Ratio: 4.8 (calc) (ref <5.0)
Triglycerides: 266 mg/dL — ABNORMAL HIGH (ref <150)

## 2018-01-08 LAB — HEMOGLOBIN A1C
Hgb A1c MFr Bld: 11 % of total Hgb — ABNORMAL HIGH (ref <5.7)
Mean Plasma Glucose: 269 (calc)
eAG (mmol/L): 14.9 (calc)

## 2018-01-08 LAB — IRON, TOTAL/TOTAL IRON BINDING CAP
%SAT: 29 % (calc) (ref 15–60)
Iron: 82 ug/dL (ref 50–180)
TIBC: 283 mcg/dL (calc) (ref 250–425)

## 2018-01-08 LAB — VITAMIN B12: Vitamin B-12: 355 pg/mL (ref 200–1100)

## 2018-01-08 LAB — PSA: PSA: 1.7 ng/mL (ref <=4.0)

## 2018-01-08 LAB — MAGNESIUM: Magnesium: 1.8 mg/dL (ref 1.5–2.5)

## 2018-01-08 LAB — VITAMIN D 25 HYDROXY (VIT D DEFICIENCY, FRACTURES): Vit D, 25-Hydroxy: 74 ng/mL (ref 30–100)

## 2018-01-08 LAB — TSH: TSH: 0.92 mIU/L (ref 0.40–4.50)

## 2018-01-21 ENCOUNTER — Ambulatory Visit: Payer: BLUE CROSS/BLUE SHIELD

## 2018-01-21 DIAGNOSIS — E349 Endocrine disorder, unspecified: Secondary | ICD-10-CM

## 2018-01-21 MED ORDER — TESTOSTERONE CYPIONATE 200 MG/ML IM SOLN
200.0000 mg | INTRAMUSCULAR | Status: DC
  Administered 2018-01-21: 200 mg via INTRAMUSCULAR

## 2018-01-21 NOTE — Progress Notes (Signed)
Patient here for a NV for Testosterone Cypionate 200 mg/ml 2 ml IM injection in the LEFT upper outer quadrant. Patient tolerated well. 

## 2018-01-22 NOTE — Progress Notes (Signed)
Called and requested hx colonoscopy for chart 01-14-18. Advised No longer a [facility] patient.

## 2018-02-03 DIAGNOSIS — N182 Chronic kidney disease, stage 2 (mild): Secondary | ICD-10-CM | POA: Insufficient documentation

## 2018-02-03 DIAGNOSIS — E1122 Type 2 diabetes mellitus with diabetic chronic kidney disease: Secondary | ICD-10-CM | POA: Insufficient documentation

## 2018-02-03 NOTE — Progress Notes (Deleted)
Diabetes Education and Follow-Up Visit  57 y.o.male presents for diabetic education. He has Diabetes Mellitus type 2:  with diabetic chronic kidney disease, he {ACTION; IS/IS NOT:21021397} on bASA, and denies {Symptoms; diabetes w/o none:19199}.   Diabetic eye exam ***  Needs foot exam ***  Last hemoglobin A1c was: Lab Results  Component Value Date   HGBA1C 11.0 (H) 01/07/2018   HGBA1C 9.2 (H) 05/30/2017   HGBA1C 7.9 (H) 09/12/2016    BMI is There is no height or weight on file to calculate BMI., he {HAS HAS DEY:81448} been working on diet and exercise. Wt Readings from Last 3 Encounters:  01/07/18 257 lb 11.2 oz (116.9 kg)  05/30/17 252 lb (114.3 kg)  09/12/16 247 lb (112 kg)     Pt is on a regimen of: Metformin 500 mg x 4 tabs daily, novolin 70/30 up to 30 units bid - uses sliding scale  Pt checks his sugars {1-4:31454} x day  Lowest sugar was ***.  He has hypoglycemia awareness.  Highest sugar was ***.  Glucometer:   Exercise:  Patient does have CKD He is on ACE/ARB  Lab Results  Component Value Date   GFRAA 77 01/07/2018   Lab Results  Component Value Date   CREATININE 1.22 01/07/2018   BUN 21 01/07/2018   NA 138 01/07/2018   K 4.5 01/07/2018   CL 102 01/07/2018   CO2 27 01/07/2018    Lab Results  Component Value Date   MICROALBUR 6.1 01/07/2018     He is on a Statin.  He is at goal of less than 70.  Lab Results  Component Value Date   CHOL 139 01/07/2018   HDL 29 (L) 01/07/2018   LDLCALC 67 05/30/2017   TRIG 266 (H) 01/07/2018   CHOLHDL 4.8 01/07/2018     Problem List has Nonallergic rhinitis; Obstructive sleep apnea-questioned; Hypertension; Hyperlipidemia; Type 2 diabetes mellitus, controlled, with renal complications ([place]); [name]; DM (diabetes mellitus), type 2 ([place]); Testosterone deficiency; Morbid obesity (BMI 36+); Poor compliance with diet; Medication management; Diabetes mellitus with circulatory disorder causing  erectile dysfunction ([place]); Insulin-requiring or dependent type II diabetes mellitus ([place]); and CKD stage 2 due to type 2 diabetes mellitus ([place]) on their problem list.  Medications Current Outpatient Medications on File Prior to Visit  Medication Sig  . amLODipine (NORVASC) 10 MG tablet Take 1 tablet daily for BP to last til scheduled office visit  . atorvastatin (LIPITOR) 20 MG tablet TAKE 1 TABLET (20 MG TOTAL) BY MOUTH DAILY.  . BD INSULIN SYRINGE ULTRAFINE 31G X 15/64" 0.5 ML MISC USE TWO PENS DAILY WITH INSULIN  . bisoprolol (ZEBETA) 10 MG tablet TAKE 1 TABLET BY MOUTH EVERY DAY  . ciclesonide (OMNARIS) 50 MCG/ACT nasal spray Place 2 sprays into both nostrils daily as needed for allergies.   . clotrimazole-betamethasone (LOTRISONE) cream APPLY 3 TIMES A DAY AS NEEDED  . ezetimibe (ZETIA) 10 MG tablet Take 1 tablet (10 mg total) by mouth daily.  . fluconazole (DIFLUCAN) 150 MG tablet TAKE 1 TABLET EVERY DAY WITH FOOD  . glucose blood (FREESTYLE LITE) test strip Check blood sugar 3 times a day or as directed. DX-E11.22  . metFORMIN (GLUCOPHAGE XR) 500 MG 24 hr tablet Take 2 tablets 2 x day for Diabetes  . NOVOLIN 70/30 (70-30) 100 UNIT/ML injection INJECT UP TO 30 UNITS IN MORNING WITH FOOD & UP TO 30 UNITS WITH EVENING MEAL,ADJUST TO SUGARS  . tadalafil (CIALIS) 20 MG tablet Take  1/2 to 1 tablet every 2-3 days as needed (Patient taking differently: Take 10-20 mg by mouth daily as needed for erectile dysfunction. Take 1/2 to 1 tablet every 2-3 days as needed)  . telmisartan (MICARDIS) 40 MG tablet Take 1 tablet (40 mg total) by mouth daily.  Marland Kitchen testosterone cypionate (DEPOTESTOSTERONE CYPIONATE) 200 MG/ML injection INJECT 2 MLS IM EVERY 2 WEEKS AS DIRECTED  . Vitamin D, Ergocalciferol, (DRISDOL) 50000 units CAPS capsule TAKE ONE CAPSULE EVERY DAY OR AS DIRECTED   Current Facility-Administered Medications on File Prior to Visit  Medication  . testosterone cypionate (DEPOTESTOSTERONE  CYPIONATE) injection 200 mg  . testosterone cypionate (DEPOTESTOSTERONE CYPIONATE) injection 200 mg  . testosterone cypionate (DEPOTESTOSTERONE CYPIONATE) injection 200 mg  . testosterone cypionate (DEPOTESTOSTERONE CYPIONATE) injection 200 mg  . testosterone cypionate (DEPOTESTOSTERONE CYPIONATE) injection 400 mg  . testosterone cypionate (DEPOTESTOSTERONE CYPIONATE) injection 400 mg    ROS- see HPI  Physical Exam: There were no vitals taken for this visit. There is no height or weight on file to calculate BMI. General Appearance: Well nourished, in no apparent distress. Eyes: PERRLA, EOMs, conjunctiva no swelling or erythema ENT/Mouth: Ext aud canals clear, TMs without erythema, bulging. No erythema, swelling, or exudate on post pharynx.  Tonsils not swollen or erythematous. Hearing normal.  Respiratory: Respiratory effort normal, BS equal bilaterally without rales, rhonchi, wheezing or stridor.  Cardio: RRR with no MRGs. Brisk peripheral pulses without edema.  Abdomen: Soft, + BS.  Non tender, no guarding, rebound, hernias, masses. Musculoskeletal: Full ROM, 5/5 strength, normal gait.  Skin: Warm, dry without rashes, lesions, ecchymosis.  Neuro: Cranial nerves intact. Normal muscle tone, no cerebellar symptoms. Sensation intact.    Plan and Assessment: Diabetes Education: Reviewed 'ABCs' of diabetes management (respective goals in parentheses):  A1C (<7), blood pressure (<130/80), and cholesterol (LDL <70) Eye Exam yearly and Dental Exam every 6 months. Dietary recommendations Physical Activity recommendations - Strongly advised him to start checking sugars at different times of the day - check 2 times a day, rotating checks - given sugar log and advised how to fill it and to bring it at next appt  - given foot care handout and explained the principles   - given instructions for hypoglycemia management    Future Appointments  Date Time Provider [place]  02/04/2018  11:15 AM Liane Comber, NP [facility] None  04/11/2018  8:45 AM Vicie Mutters, PA-C [facility] None  01/13/2019  9:00 AM Vicie Mutters, PA-C [facility] None

## 2018-02-04 ENCOUNTER — Ambulatory Visit: Payer: Self-pay | Admitting: Adult Health

## 2018-02-05 NOTE — Progress Notes (Signed)
Diabetes Education and Follow-Up Visit  57 y.o.male presents for diabetic education. He has Diabetes Mellitus type 2:  with diabetic chronic kidney disease and with other circulatory complications, he is on bASA (81 mg every other day per nephrology recommendation), and denies foot ulcerations, increased appetite, nausea, paresthesia of the feet, polydipsia, polyuria, visual disturbances, vomiting and weight loss. He has been poorly compliant with diet thus far but presents with 8 lb weight loss, states he has cut out all liquid calories including fruit juice which was previously a problem.  Diabetic eye exam: has had last year 2018 - report requested, due for 2019  Last hemoglobin A1c was: Lab Results  Component Value Date   HGBA1C 11.0 (H) 01/07/2018   HGBA1C 9.2 (H) 05/30/2017   HGBA1C 7.9 (H) 09/12/2016   BMI is Body mass index is 35.73 kg/m., he has been working on diet and exercise. He has cut out meat except for the weekends, has been focusing on vegetables during the week.  Wt Readings from Last 3 Encounters:  02/06/18 249 lb (112.9 kg)  01/07/18 257 lb 11.2 oz (116.9 kg)  05/30/17 252 lb (114.3 kg)   Pt is on a regimen of:  Metformin 500 mg BID, novolin 70/30 - takes 20-25 units in AM and PM based on fasting sugar  Pt checks his sugars 2 x day  Fasting: 150-170 Evenings before dinner: <200 Lowest sugar was 78 with diaphoresis.  He has hypoglycemia awareness.  Highest sugar was 220.  Glucometer: Freestyle lite  Diet -  Breakfast: pack of peanut butter crackers, or a sausage patty, jimmy dean sausage Midday: zaxby's grilled chicken zapp or chicken salad with vinigarette, Ghassan's - salad, olive oil dressing, 2 chicken skewers and a piece of pita bread, or moe's taco salad minus the shell -  Dinner: depends - whatever his wife cooks, widely varies - avoids beef, seafood, - typically has chicken/turkey, with lima beans, vegetables  Exercise: 3-4 miles daily per his pedometer  watch  Patient does have CKD He is on ACE/ARB  Lab Results  Component Value Date   GFRAA 77 01/07/2018   Lab Results  Component Value Date   CREATININE 1.22 01/07/2018   BUN 21 01/07/2018   NA 138 01/07/2018   K 4.5 01/07/2018   CL 102 01/07/2018   CO2 27 01/07/2018    Lab Results  Component Value Date   MICROALBUR 6.1 01/07/2018    He is on a Statin.  He is at goal of less than 70.  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  Medications Current Outpatient Medications on File Prior to Visit  Medication Sig  . amLODipine (NORVASC) 10 MG tablet Take 1 tablet daily for BP to last til scheduled office visit  . atorvastatin (LIPITOR) 20 MG tablet TAKE 1 TABLET (20 MG TOTAL) BY MOUTH DAILY.  . BD INSULIN SYRINGE ULTRAFINE 31G X 15/64" 0.5 ML  MISC USE TWO PENS DAILY WITH INSULIN  . bisoprolol (ZEBETA) 10 MG tablet TAKE 1 TABLET BY MOUTH EVERY DAY  . ciclesonide (OMNARIS) 50 MCG/ACT nasal spray Place 2 sprays into both nostrils daily as needed for allergies.   . clotrimazole-betamethasone (LOTRISONE) cream APPLY 3 TIMES A DAY AS NEEDED  . ezetimibe (ZETIA) 10 MG tablet Take 1 tablet (10 mg total) by mouth daily.  . fluconazole (DIFLUCAN) 150 MG tablet TAKE 1 TABLET EVERY DAY WITH FOOD  . glucose blood (FREESTYLE  LITE) test strip Check blood sugar 3 times a day or as directed. DX-E11.22  . metFORMIN (GLUCOPHAGE XR) 500 MG 24 hr tablet Take 2 tablets 2 x day for Diabetes  . NOVOLIN 70/30 (70-30) 100 UNIT/ML injection INJECT UP TO 30 UNITS IN MORNING WITH FOOD & UP TO 30 UNITS WITH EVENING MEAL,ADJUST TO SUGARS  . tadalafil (CIALIS) 20 MG tablet Take 1/2 to 1 tablet every 2-3 days as needed (Patient taking differently: Take 10-20 mg by mouth daily as needed for erectile dysfunction. Take 1/2 to 1 tablet every 2-3 days as needed)  . telmisartan (MICARDIS) 40 MG tablet Take 1 tablet (40 mg total) by mouth daily.  Marland Kitchen testosterone cypionate (DEPOTESTOSTERONE CYPIONATE) 200 MG/ML injection INJECT 2 MLS IM EVERY 2 WEEKS AS DIRECTED  . Vitamin D, Ergocalciferol, (DRISDOL) 50000 units CAPS capsule TAKE ONE CAPSULE EVERY DAY OR AS DIRECTED   Current Facility-Administered Medications on File Prior to Visit  Medication  . testosterone cypionate (DEPOTESTOSTERONE CYPIONATE) injection 200 mg  . testosterone cypionate (DEPOTESTOSTERONE CYPIONATE) injection 200 mg  . testosterone cypionate (DEPOTESTOSTERONE CYPIONATE) injection 200 mg  . testosterone cypionate (DEPOTESTOSTERONE CYPIONATE) injection 200 mg  . testosterone cypionate (DEPOTESTOSTERONE CYPIONATE) injection 400 mg  . testosterone cypionate (DEPOTESTOSTERONE CYPIONATE) injection 400 mg    ROS- see HPI  Physical Exam: Blood pressure 126/76, pulse 69, temperature 97.7 F (36.5 C), height 5\' 10"  (1.778 m), weight 249 lb (112.9 kg), SpO2 96 %. Body mass index is 35.73 kg/m. General Appearance: Well nourished, in no apparent distress. Eyes: PERRLA, EOMs, conjunctiva no swelling or erythema ENT/Mouth: Ext aud canals clear, TMs without erythema, bulging. No erythema, swelling, or exudate on post pharynx.  Tonsils not swollen or erythematous. Hearing normal.  Respiratory: Respiratory effort normal, BS equal bilaterally without rales, rhonchi, wheezing or  stridor.  Cardio: RRR with no MRGs. Brisk peripheral pulses without edema.  Abdomen: Soft, + BS.  Non tender, no guarding, rebound, hernias, masses. Musculoskeletal: Full ROM, 5/5 strength, normal gait.  Skin: Warm, dry without rashes, lesions, ecchymosis.  Neuro: Cranial nerves intact. Normal muscle tone, no cerebellar symptoms. Sensation intact to monofilament.     Plan and Assessment: Diabetes Education: Reviewed 'ABCs' of diabetes management (respective goals in parentheses):  A1C (<7), blood pressure (<130/80), and cholesterol (LDL <70) Eye Exam yearly and Dental Exam every 6 months.- reminded he is due for this year- last year report requested [place] dyscussed; in light of patient progress with weight loss and strong patient request will postpone at this time and reevaluated at next OV in 2 months Dietary recommendations reviewed in detail Physical Activity recommendations - advised him to start checking sugars at different times of the day - check 2 times a day, rotating checks - given sugar log and advised how to fill it and to bring it at next appt  - given foot care handout and explained the principles  - given instructions for hypoglycemia management    Future Appointments  Date  Time Provider [place]  04/11/2018  8:45 AM Vicie Mutters, PA-C [facility] None  01/13/2019  9:00 AM Vicie Mutters, PA-C [facility] None

## 2018-02-06 ENCOUNTER — Encounter: Payer: Self-pay | Admitting: Adult Health

## 2018-02-06 ENCOUNTER — Ambulatory Visit: Payer: BLUE CROSS/BLUE SHIELD | Admitting: Adult Health

## 2018-02-06 VITALS — BP 126/76 | HR 69 | Temp 97.7°F | Ht 70.0 in | Wt 249.0 lb

## 2018-02-06 DIAGNOSIS — I10 Essential (primary) hypertension: Secondary | ICD-10-CM

## 2018-02-06 DIAGNOSIS — N182 Chronic kidney disease, stage 2 (mild): Secondary | ICD-10-CM

## 2018-02-06 DIAGNOSIS — E1122 Type 2 diabetes mellitus with diabetic chronic kidney disease: Secondary | ICD-10-CM | POA: Diagnosis not present

## 2018-02-06 DIAGNOSIS — Z9119 Patient's noncompliance with other medical treatment and regimen: Secondary | ICD-10-CM | POA: Diagnosis not present

## 2018-02-06 DIAGNOSIS — E1159 Type 2 diabetes mellitus with other circulatory complications: Secondary | ICD-10-CM

## 2018-02-06 DIAGNOSIS — N521 Erectile dysfunction due to diseases classified elsewhere: Secondary | ICD-10-CM

## 2018-02-06 DIAGNOSIS — Z91199 Patient's noncompliance with other medical treatment and regimen due to unspecified reason: Secondary | ICD-10-CM

## 2018-02-06 DIAGNOSIS — E349 Endocrine disorder, unspecified: Secondary | ICD-10-CM

## 2018-02-06 MED ORDER — TESTOSTERONE CYPIONATE 200 MG/ML IM SOLN
200.0000 mg | Freq: Once | INTRAMUSCULAR | Status: AC
  Administered 2018-02-06: 200 mg via INTRAMUSCULAR

## 2018-02-06 NOTE — Patient Instructions (Signed)
Targets for Glucose Readings: Time of Check Usual Target for Most People  Before Meals  70-130  Two hours after meals  Less than 180  Bedtime  90-150      Bad carbs also include fruit juice, alcohol, and sweet tea. These are empty calories that do not signal to your brain that you are full.   Please remember the good carbs are still carbs which convert into sugar. So please measure them out no more than 1/2-1 cup of rice, oatmeal, pasta, and beans  Veggies are however free foods! Pile them on.   Not all fruit is created equal. Please see the list below, the fruit at the bottom is higher in sugars than the fruit at the top. Please avoid all dried fruits.        Why Should You Check Your Blood Glucose? -The A1C tells you how your diabetes is doing over a 3 month period.  Home blood glucose monitoring (or checking) gives you information about your diabetes on a daily basis.  You will learn how well your diabetes care plan is working and whether your blood glucose is in your target range throughout the day.   -Reviewing daily blood glucose levels will help you and your healthcare team make any needed changes to you meal plan, physical activity and medications.  Meter Supplies: - A glucose meter was provided at your visit today along with strips and lancets. The blood glucose meter strips and lancets are usually covered by health insurance. Please let us know if they are not and contact your insurance to see which meter they prefer.  How to get a good blood sample: -Wash your hands in warm water.  You do not need to use alcohol wipes. -Massage your hands. -Choose which finger you will use.  It helps to use a different finger each time to avoid soreness. -Keep you hand below your wrist when using you lancet to "prick" your finger. -Apply gentle pressure, but do NOT squeeze your finger.  Checking your blood glucose Important Reminders: -Make sure your strips are not expired.  Check the  date on the bottle. -Make sure the code on bottle matches the code on your machine. -Make sure your hands are clean and dry. -Do not use the center of your finger, it is the most sensitive area.  Use a spot to the side of the center of your fingertip. -Completely fill the strip target area with blood (until it beeps) to make sure the results are accurate. -You will need a prescription to have your glucose strips and lancets covered by insurance. -There is usually an 800 number on the back of your meter for help with meter issues.  How often to check: -If you take diabetes pills or take one injection of insulin each day, you will usually be asked to check twice a day, before breakfast and 2 hours after one meal, or as directed. -If you take several insulin injections each day, you will usually be asked to check four times a day before meals and at bedtime every day.

## 2018-02-06 NOTE — Progress Notes (Signed)
Patient here for a NV for Testosterone Cypionate 200 mg/ml 2 ml IM injection in theRIGHTupper outer quadrant. Patient tolerated well. 

## 2018-02-08 ENCOUNTER — Other Ambulatory Visit: Payer: Self-pay | Admitting: Internal Medicine

## 2018-02-10 LAB — BASIC METABOLIC PANEL WITH GFR
BUN: 21 mg/dL (ref 7–25)
CO2: 30 mmol/L (ref 20–32)
Calcium: 10 mg/dL (ref 8.6–10.3)
Chloride: 102 mmol/L (ref 98–110)
Creat: 1.2 mg/dL (ref 0.70–1.33)
GFR, Est African American: 78 mL/min/{1.73_m2} (ref >=60)
GFR, Est Non African American: 68 mL/min/{1.73_m2} (ref >=60)
Glucose, Bld: 97 mg/dL (ref 65–99)
Potassium: 4.3 mmol/L (ref 3.5–5.3)
Sodium: 139 mmol/L (ref 135–146)

## 2018-02-10 LAB — FRUCTOSAMINE: Fructosamine: 366 umol/L — ABNORMAL HIGH (ref 190–270)

## 2018-02-18 ENCOUNTER — Other Ambulatory Visit: Payer: Self-pay | Admitting: Internal Medicine

## 2018-02-24 ENCOUNTER — Encounter: Payer: Self-pay | Admitting: Gastroenterology

## 2018-02-28 ENCOUNTER — Other Ambulatory Visit: Payer: Self-pay | Admitting: Internal Medicine

## 2018-02-28 DIAGNOSIS — R300 Dysuria: Secondary | ICD-10-CM

## 2018-03-17 ENCOUNTER — Other Ambulatory Visit: Payer: Self-pay | Admitting: Internal Medicine

## 2018-04-11 ENCOUNTER — Other Ambulatory Visit: Payer: Self-pay

## 2018-04-11 ENCOUNTER — Ambulatory Visit: Payer: Self-pay | Admitting: Physician Assistant

## 2018-04-11 ENCOUNTER — Ambulatory Visit (AMBULATORY_SURGERY_CENTER): Payer: Self-pay

## 2018-04-11 VITALS — Ht 70.0 in | Wt 253.0 lb

## 2018-04-11 DIAGNOSIS — Z1211 Encounter for screening for malignant neoplasm of colon: Secondary | ICD-10-CM

## 2018-04-11 MED ORDER — NA SULFATE-K SULFATE-MG SULF 17.5-3.13-1.6 GM/177ML PO SOLN: 1.0000 | Freq: Once | ORAL | 0 refills | Status: AC

## 2018-04-11 NOTE — Progress Notes (Signed)
No egg or soy allergy known to patient  No issues with past sedation with any surgeries  or procedures, no intubation problems  No diet pills per patient No home 02 use per patient  No blood thinners per patient  Pt denies issues with constipation  No A fib or A flutter  EMMI video sent to pt's e mail , pt declined    

## 2018-04-14 NOTE — Progress Notes (Signed)
Patient ID: Russell [name], male   DOB: 1961-08-18, 57 y.o.   MRN: [id number]  Assessment and Plan:  Hypertension:  -Continue medication -monitor blood pressure at home. -Continue DASH diet -Reminder to go to the ER if any CP, SOB, nausea, dizziness, severe HA, changes vision/speech, left arm numbness and tingling and jaw pain.  Cholesterol - Continue diet and exercise -Check cholesterol.   Diabetes with diabetic chronic kidney disease -Continue diet and exercise.  -Check A1C With job change, stress and increase movement patient has had some low sugars He is having to eat more due to this  Will send in RX for [employer] Stressed importance of keeping food and sugar log and bringing that in versus referral to endocrinology.  Will likely need 2-4 week follow up  Vitamin D Def -check level -continue medications.   Testosterone Def -given injection today -check level  Morbid Obesity with co morbidities - long discussion about weight loss, diet, and exercise  Continue diet and meds as discussed. Further disposition pending results of labs. Discussed med's effects and SE's.   Future Appointments  Date Time Provider [place]  04/28/2018  9:30 AM Armbruster, Carlota Raspberry, MD [facility]  01/13/2019  9:00 AM Vicie Mutters, PA-C [facility] None    HPI 57 y.o. AA male  presents for 3 month follow up with hypertension, hyperlipidemia, diabetes and [name].  Wife has autoimmune disease, trying to figure it out which one, she will not be able to work again, he is under a lot of stress, sounds like she is having orthostatic dysfunction.    His blood pressure has been controlled at home, today their BP is BP: 120/76.He does workout. He denies chest pain, shortness of breath, dizziness. He has new job, doing much walking at new job, averaging 3-4 miles.    He is on cholesterol medication and denies myalgias. His cholesterol is at goal. The cholesterol was:   01/07/2018: Cholesterol 139; HDL 29; LDL Cholesterol (Calc) 75; Triglycerides 266   He has been working on diet and exercise for diabetes with diabetic chronic kidney disease, he is on bASA, he is on ACE/ARB,  He is on Metformin and 70/30 mix Am 20-25, PM 15-17, he is checking his sugars but not bringing a sugar log but still has fasting sugars in the lowest 80's- highest 180's. 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 Last A1C was: 01/07/2018: Hgb A1c MFr Bld 11.0.   Lab Results  Component Value Date   GFRAA 78 02/06/2018   Patient is on Vitamin D supplement. 01/07/2018: Vit D, 25-Hydroxy 74  He has a history of testosterone deficiency and is on testosterone replacement. He states that the testosterone helps with his energy, libido, muscle mass. Lab Results  Component Value Date   TESTOSTERONE 320 02/01/2016   BMI is Body mass index is 37.02 kg/m., he is working on diet and exercise. Wt Readings from Last 3 Encounters:  04/17/18 258 lb (117 kg)  04/11/18 253 lb (114.8 kg)  02/06/18 249 lb (112.9 kg)    Current Medications:   Current Outpatient Medications (Endocrine & Metabolic):  Marland Kitchen  NOVOLIN 70/30 (70-30) 100 UNIT/ML injection, INJECT UP TO 30 UNITS IN MORNING WITH FOOD & UP TO 30  UNITS WITH EVENING MEAL,ADJUST TO SUGARS .  testosterone cypionate (DEPOTESTOSTERONE CYPIONATE) 200 MG/ML injection, INJECT 2 ML INTO THE MUSCLE EVERY 2 WEEKS .  metFORMIN (GLUCOPHAGE XR) 500 MG 24 hr tablet, Take 2 tablets 2 x day for Diabetes  Current Facility-Administered Medications (Endocrine & Metabolic):  .  testosterone cypionate (DEPOTESTOSTERONE CYPIONATE) injection 200 mg  Current Outpatient Medications  (Cardiovascular):  .  amLODipine (NORVASC) 10 MG tablet, TAKE 1 TABLET BY MOUTH EVERY DAY .  atorvastatin (LIPITOR) 20 MG tablet, TAKE 1 TABLET (20 MG TOTAL) BY MOUTH DAILY. .  bisoprolol (ZEBETA) 10 MG tablet, TAKE 1 TABLET BY MOUTH EVERY DAY .  ezetimibe (ZETIA) 10 MG tablet, Take 10 mg by mouth daily. .  tadalafil (CIALIS) 20 MG tablet, Take 1/2 to 1 tablet every 2-3 days as needed (Patient taking differently: Take 10-20 mg by mouth daily as needed for erectile dysfunction. Take 1/2 to 1 tablet every 2-3 days as needed) .  telmisartan (MICARDIS) 40 MG tablet, Take 1 tablet (40 mg total) by mouth daily.   Current Outpatient Medications (Respiratory):  .  ciclesonide (OMNARIS) 50 MCG/ACT nasal spray, Place 2 sprays into both nostrils daily as needed for allergies.        Current Outpatient Medications (Other):  .  BD VEO INSULIN SYRINGE U/F 31G X 15/64" 0.5 ML MISC, USE TWO PENS DAILY WITH INSULIN .  clotrimazole-betamethasone (LOTRISONE) cream, APPLY 3 TIMES A DAY AS NEEDED .  fluconazole (DIFLUCAN) 150 MG tablet, TAKE 1 TABLET BY MOUTH DAILY WITH FOOD .  glucose blood (FREESTYLE LITE) test strip, Check blood sugar 3 times a day or as directed. DX-E11.22 .  senna-docusate (SENOKOT-S) 8.6-50 MG tablet, Take by mouth. .  Vitamin D, Ergocalciferol, (DRISDOL) 50000 units CAPS capsule, TAKE ONE CAPSULE EVERY DAY OR AS DIRECTED    Medical History:  Past Medical History:  Diagnosis Date  . Allergic rhinitis   . Allergy   . Chronic sinusitis   . Diabetes mellitus ([place])   . Hyperlipidemia   . Hypertension   . Hypogonadism male   . Perinephric hematoma 07/16/2015   Allergies:  Allergies  Allergen Reactions  . 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  HENT: Negative for congestion, ear pain and sore  throat.   Eyes: Negative.   Respiratory: Negative for cough, shortness of breath and wheezing.   Cardiovascular: Negative for chest pain, palpitations and leg swelling.  Gastrointestinal: Positive for diarrhea (Has colonoscopy schedule end of this month). Negative for blood in stool, constipation, heartburn and melena.  Genitourinary: Negative for dysuria, frequency, hematuria and urgency.  Musculoskeletal: Negative for myalgias.  Skin: Negative.   Neurological: Negative for dizziness, sensory change, loss of consciousness and headaches.  Psychiatric/Behavioral: Negative for depression. The patient is not nervous/anxious and does not have insomnia.     Family history- Review and unchanged  Social history- Review and unchanged  Physical Exam: BP 120/76   Pulse 73   Temp (!) 97.4 F (36.3 C)   Resp 16   Ht 5\' 10"  (1.778 m)   Wt 258 lb (117 kg)   SpO2 98%   BMI 37.02 kg/m  Wt Readings from Last 3 Encounters:  04/17/18 258 lb (117 kg)  04/11/18 253 lb (114.8 kg)  02/06/18 249 lb (112.9 kg)   General Appearance: Well  nourished well developed, non-toxic appearing, in no apparent distress. Eyes: PERRLA, EOMs, conjunctiva no swelling or erythema ENT/Mouth: Ear canals clear with no erythema, swelling, or discharge.  TMs normal bilaterally, oropharynx clear, moist, with no exudate.   Neck: Supple, thyroid normal, no JVD, no cervical adenopathy.  Respiratory: Respiratory effort normal, breath sounds clear A&P, no wheeze, rhonchi or rales noted.  No retractions, no accessory muscle usage Cardio: RRR with no MRGs. No noted edema.  Abdomen: Soft, + BS.  Non tender, no guarding, rebound, hernias, masses. Musculoskeletal: Full ROM, 5/5 strength, Normal gait Skin: Warm, dry without rashes, lesions, ecchymosis.  Neuro: Awake and oriented X 3, Cranial nerves intact. No cerebellar symptoms.  Psych: normal affect, Insight and Judgment appropriate.    Vicie Mutters, PA-C 9:01 AM [facility]  Adult & Adolescent Internal Medicine

## 2018-04-17 ENCOUNTER — Encounter: Payer: Self-pay | Admitting: Physician Assistant

## 2018-04-17 ENCOUNTER — Ambulatory Visit: Payer: BLUE CROSS/BLUE SHIELD | Admitting: Physician Assistant

## 2018-04-17 VITALS — BP 120/76 | HR 73 | Temp 97.4°F | Resp 16 | Ht 70.0 in | Wt 258.0 lb

## 2018-04-17 DIAGNOSIS — I10 Essential (primary) hypertension: Secondary | ICD-10-CM | POA: Diagnosis not present

## 2018-04-17 DIAGNOSIS — N183 Chronic kidney disease, stage 3 unspecified: Secondary | ICD-10-CM

## 2018-04-17 DIAGNOSIS — N521 Erectile dysfunction due to diseases classified elsewhere: Secondary | ICD-10-CM

## 2018-04-17 DIAGNOSIS — Z79899 Other long term (current) drug therapy: Secondary | ICD-10-CM

## 2018-04-17 DIAGNOSIS — E782 Mixed hyperlipidemia: Secondary | ICD-10-CM

## 2018-04-17 DIAGNOSIS — E1122 Type 2 diabetes mellitus with diabetic chronic kidney disease: Secondary | ICD-10-CM

## 2018-04-17 DIAGNOSIS — E119 Type 2 diabetes mellitus without complications: Secondary | ICD-10-CM

## 2018-04-17 DIAGNOSIS — N182 Chronic kidney disease, stage 2 (mild): Secondary | ICD-10-CM

## 2018-04-17 DIAGNOSIS — E349 Endocrine disorder, unspecified: Secondary | ICD-10-CM

## 2018-04-17 DIAGNOSIS — E1159 Type 2 diabetes mellitus with other circulatory complications: Secondary | ICD-10-CM | POA: Diagnosis not present

## 2018-04-17 DIAGNOSIS — Z794 Long term (current) use of insulin: Secondary | ICD-10-CM | POA: Diagnosis not present

## 2018-04-17 MED ORDER — FREESTYLE LIBRE SENSOR SYSTEM MISC: 0 refills | Status: DC

## 2018-04-17 MED ORDER — TESTOSTERONE CYPIONATE 200 MG/ML IM SOLN: 200.0000 mg | INTRAMUSCULAR | Status: DC

## 2018-04-17 NOTE — Patient Instructions (Signed)
Check out  Mini habits for weight loss book  2 apps for tracking food is myfitness pal  loseit OR can take picture of your food  Please keep a food log and sugar log and bring this with you next OV   Your A1C is a measure of your sugar over the past 3 months and is not affected by what you have eaten over the past few days. Diabetes increases your chances of stroke and heart attack over 300 % and is the leading cause of blindness and kidney failure in the [place]. Please make sure you decrease bad carbs like white bread, white rice, potatoes, corn, soft drinks, pasta, cereals, refined sugars, sweet tea, dried fruits, and fruit juice. Good carbs are okay to eat in moderation like sweet potatoes, brown rice, whole grain pasta/bread, most fruit (except dried fruit) and you can eat as many veggies as you want.   Greater than 6.5 is considered diabetic. Between 6.4 and 5.7 is prediabetic If your A1C is less than 5.7 you are NOT diabetic.  Targets for Glucose Readings: Time of Check Target for patients WITHOUT Diabetes Target for DIABETICS  Before Meals Less than 100  less than 150  Two hours after meals Less than 200  Less than 250    If your morning sugar is always below 100 then the issue is with your sugar spiking after meals. Try to take your blood sugar approximately 2 hours after eating, this number should be less than 200. If it is not, think about the foods that you ate and better choices you can make.      When it comes to diets, agreement about the perfect plan isn't easy to find, even among the experts. Experts at the [place] developed an idea known as the Healthy Eating Plate. Just imagine a plate divided into logical, healthy portions.  The emphasis is on diet quality:  Load up on vegetables and fruits - one-half of your plate: Aim for color and variety, and remember that potatoes don't count.  Go for whole grains - one-quarter of your plate:  Whole wheat, barley, wheat berries, quinoa, oats, brown rice, and foods made with them. If you want pasta, go with whole wheat pasta.  Protein power - one-quarter of your plate: Fish, chicken, beans, and nuts are all healthy, versatile protein sources. Limit red meat.  The diet, however, does go beyond the plate, offering a few other suggestions.  Use healthy plant oils, such as olive, canola, soy, corn, sunflower and peanut. Check the labels, and avoid partially hydrogenated oil, which have unhealthy trans fats.  If you're thirsty, drink water. Coffee and tea are good in moderation, but skip sugary drinks and limit milk and dairy products to one or two daily servings.  The type of carbohydrate in the diet is more important than the amount. Some sources of carbohydrates, such as vegetables, fruits, whole grains, and beans-are healthier than others.  Finally, stay active.

## 2018-04-18 LAB — CBC WITH DIFFERENTIAL/PLATELET
Basophils Absolute: 42 cells/uL (ref 0–200)
Basophils Relative: 0.7 %
Eosinophils Absolute: 252 cells/uL (ref 15–500)
Eosinophils Relative: 4.2 %
HCT: 44.2 % (ref 38.5–50.0)
Hemoglobin: 14.9 g/dL (ref 13.2–17.1)
Lymphs Abs: 1974 cells/uL (ref 850–3900)
MCH: 28.1 pg (ref 27.0–33.0)
MCHC: 33.7 g/dL (ref 32.0–36.0)
MCV: 83.2 fL (ref 80.0–100.0)
MPV: 9.6 fL (ref 7.5–12.5)
Monocytes Relative: 12.8 %
Neutro Abs: 2964 cells/uL (ref 1500–7800)
Neutrophils Relative %: 49.4 %
Platelets: 299 10*3/uL (ref 140–400)
RBC: 5.31 10*6/uL (ref 4.20–5.80)
RDW: 12.9 % (ref 11.0–15.0)
Total Lymphocyte: 32.9 %
WBC mixed population: 768 cells/uL (ref 200–950)
WBC: 6 10*3/uL (ref 3.8–10.8)

## 2018-04-18 LAB — LIPID PANEL
Cholesterol: 129 mg/dL (ref <200)
HDL: 30 mg/dL — ABNORMAL LOW (ref >40)
LDL Cholesterol (Calc): 81 mg/dL (calc)
Non-HDL Cholesterol (Calc): 99 mg/dL (calc) (ref <130)
Total CHOL/HDL Ratio: 4.3 (calc) (ref <5.0)
Triglycerides: 101 mg/dL (ref <150)

## 2018-04-18 LAB — COMPLETE METABOLIC PANEL WITH GFR
AG Ratio: 1.4 (calc) (ref 1.0–2.5)
ALT: 13 U/L (ref 9–46)
AST: 13 U/L (ref 10–35)
Albumin: 4 g/dL (ref 3.6–5.1)
Alkaline phosphatase (APISO): 72 U/L (ref 40–115)
BUN: 22 mg/dL (ref 7–25)
CO2: 27 mmol/L (ref 20–32)
Calcium: 9.5 mg/dL (ref 8.6–10.3)
Chloride: 103 mmol/L (ref 98–110)
Creat: 1.33 mg/dL (ref 0.70–1.33)
GFR, Est African American: 69 mL/min/{1.73_m2} (ref >=60)
GFR, Est Non African American: 60 mL/min/{1.73_m2} (ref >=60)
Globulin: 2.8 g/dL (calc) (ref 1.9–3.7)
Glucose, Bld: 173 mg/dL — ABNORMAL HIGH (ref 65–99)
Potassium: 4.3 mmol/L (ref 3.5–5.3)
Sodium: 137 mmol/L (ref 135–146)
Total Bilirubin: 0.4 mg/dL (ref 0.2–1.2)
Total Protein: 6.8 g/dL (ref 6.1–8.1)

## 2018-04-18 LAB — HEMOGLOBIN A1C
Hgb A1c MFr Bld: 11.7 % of total Hgb — ABNORMAL HIGH (ref <5.7)
Mean Plasma Glucose: 289 (calc)
eAG (mmol/L): 16 (calc)

## 2018-04-18 LAB — TSH: TSH: 0.95 mIU/L (ref 0.40–4.50)

## 2018-04-21 ENCOUNTER — Encounter: Payer: Self-pay | Admitting: Gastroenterology

## 2018-04-25 ENCOUNTER — Encounter: Payer: Self-pay | Admitting: Gastroenterology

## 2018-04-28 ENCOUNTER — Ambulatory Visit (AMBULATORY_SURGERY_CENTER): Payer: BLUE CROSS/BLUE SHIELD | Admitting: Gastroenterology

## 2018-04-28 ENCOUNTER — Other Ambulatory Visit: Payer: Self-pay

## 2018-04-28 ENCOUNTER — Encounter: Payer: Self-pay | Admitting: Gastroenterology

## 2018-04-28 VITALS — BP 120/69 | HR 59 | Temp 98.6°F | Resp 13 | Ht 70.0 in | Wt 253.0 lb

## 2018-04-28 DIAGNOSIS — D122 Benign neoplasm of ascending colon: Secondary | ICD-10-CM | POA: Diagnosis not present

## 2018-04-28 DIAGNOSIS — Z1211 Encounter for screening for malignant neoplasm of colon: Secondary | ICD-10-CM

## 2018-04-28 DIAGNOSIS — D124 Benign neoplasm of descending colon: Secondary | ICD-10-CM

## 2018-04-28 LAB — HM COLONOSCOPY

## 2018-04-28 MED ORDER — SODIUM CHLORIDE 0.9 % IV SOLN: 500.0000 mL | Freq: Once | INTRAVENOUS | Status: DC

## 2018-04-28 NOTE — Patient Instructions (Signed)
HANDOUTS GIVEN FOR POLYPS AND HEMORRHOIDS  YOU HAD AN ENDOSCOPIC PROCEDURE TODAY AT THE [facility]:   Refer to the procedure report that was given to you for any specific questions about what was found during the examination.  If the procedure report does not answer your questions, please call your gastroenterologist to clarify.  If you requested that your care partner not be given the details of your procedure findings, then the procedure report has been included in a sealed envelope for you to review at your convenience later.  YOU SHOULD EXPECT: Some feelings of bloating in the abdomen. Passage of more gas than usual.  Walking can help get rid of the air that was put into your GI tract during the procedure and reduce the bloating. If you had a lower endoscopy (such as a colonoscopy or flexible sigmoidoscopy) you may notice spotting of blood in your stool or on the toilet paper. If you underwent a bowel prep for your procedure, you may not have a normal bowel movement for a few days.  Please Note:  You might notice some irritation and congestion in your nose or some drainage.  This is from the oxygen used during your procedure.  There is no need for concern and it should clear up in a day or so.  SYMPTOMS TO REPORT IMMEDIATELY:   Following lower endoscopy (colonoscopy or flexible sigmoidoscopy):  Excessive amounts of blood in the stool  Significant tenderness or worsening of abdominal pains  Swelling of the abdomen that is new, acute  Fever of 100F or higher   For urgent or emergent issues, a gastroenterologist can be reached at any hour by calling [phone].   DIET:  We do recommend a small meal at first, but then you may proceed to your regular diet.  Drink plenty of fluids but you should avoid alcoholic beverages for 24 hours.  ACTIVITY:  You should plan to take it easy for the rest of today and you should NOT DRIVE or use heavy machinery until tomorrow (because of the  sedation medicines used during the test).    FOLLOW UP: Our staff will call the number listed on your records the next business day following your procedure to check on you and address any questions or concerns that you may have regarding the information given to you following your procedure. If we do not reach you, we will leave a message.  However, if you are feeling well and you are not experiencing any problems, there is no need to return our call.  We will assume that you have returned to your regular daily activities without incident.  If any biopsies were taken you will be contacted by phone or by letter within the next 1-3 weeks.  Please call us at [phone] if you have not heard about the biopsies in 3 weeks.    SIGNATURES/CONFIDENTIALITY: You and/or your care partner have signed paperwork which will be entered into your electronic medical record.  These signatures attest to the fact that that the information above on your After Visit Summary has been reviewed and is understood.  Full responsibility of the confidentiality of this discharge information lies with you and/or your care-partner. 

## 2018-04-28 NOTE — Progress Notes (Signed)
Pt's states no medical or surgical changes since previsit or office visit. 

## 2018-04-28 NOTE — Progress Notes (Signed)
To PACU, VSS. Report to [email or web address] 

## 2018-04-28 NOTE — Progress Notes (Signed)
Called to room to assist during endoscopic procedure.  Patient ID and intended procedure confirmed with present staff. Received instructions for my participation in the procedure from the performing physician.  

## 2018-04-28 NOTE — Op Note (Signed)
Russell [name] Patient Name: Russell [name] [facility] Procedure Date: 04/28/2018 9:46 AM MRN: [id number] Endoscopist: Remo Lipps P. Francena Zender MD, MD Age: 57 Referring MD:  Date of Birth: 1961-05-06 Gender: Male Account #: [id number] Procedure:                Colonoscopy Indications:              Screening for colorectal malignant neoplasm Medicines:                Monitored Anesthesia Care Procedure:                Pre-Anesthesia Assessment:                           - Prior to the procedure, a History and Physical                            was performed, and patient medications and                            allergies were reviewed. The patient's tolerance of                            previous anesthesia was also reviewed. The risks                            and benefits of the procedure and the sedation                            options and risks were discussed with the patient.                            All questions were answered, and informed consent                            was obtained. Prior Anticoagulants: The patient has                            taken no previous anticoagulant or antiplatelet                            agents. ASA Grade Assessment: III - A patient with                            severe systemic disease. After reviewing the risks                            and benefits, the patient was deemed in                            satisfactory condition to undergo the procedure.                           After obtaining informed consent, the colonoscope  was passed under direct vision. Throughout the                            procedure, the patient's blood pressure, pulse, and                            oxygen saturations were monitored continuously. The                            Colonoscope was introduced through the anus and                            advanced to the the cecum, identified by                            appendiceal  orifice and ileocecal valve. The                            colonoscopy was performed without difficulty. The                            patient tolerated the procedure well. The quality                            of the bowel preparation was good. The ileocecal                            valve, appendiceal orifice, and rectum were                            photographed. Scope In: 9:48:43 AM Scope Out: 10:06:39 AM Scope Withdrawal Time: 0 hours 16 minutes 33 seconds  Total Procedure Duration: 0 hours 17 minutes 56 seconds  Findings:                 The perianal and digital rectal examinations were                            normal.                           A 3 mm polyp was found in the ascending colon found                            in the retroflexed position. The polyp was sessile.                            The polyp was removed with a cold biopsy forceps.                            Resection and retrieval were complete.                           Two sessile polyps were found in the descending  colon. The polyps were 3 mm in size. These polyps                            were removed with a cold snare. Resection and                            retrieval were complete.                           Internal hemorrhoids were found during retroflexion.                           The exam was otherwise without abnormality. Complications:            No immediate complications. Estimated blood loss:                            Minimal. Estimated Blood Loss:     Estimated blood loss was minimal. Impression:               - One 3 mm polyp in the ascending colon, removed                            with a cold biopsy forceps. Resected and retrieved.                           - Two 3 mm polyps in the descending colon, removed                            with a cold snare. Resected and retrieved.                           - Internal hemorrhoids.                           -  The examination was otherwise normal. Recommendation:           - Patient has a contact number available for                            emergencies. The signs and symptoms of potential                            delayed complications were discussed with the                            patient. Return to normal activities tomorrow.                            Written discharge instructions were provided to the                            patient.                           - Resume previous diet.                           -  Continue present medications.                           - Await pathology results.                           - Repeat colonoscopy for surveillance based on                            pathology results. Remo Lipps P. Jousha Schwandt MD, MD 04/28/2018 10:11:50 AM This report has been signed electronically.

## 2018-04-29 ENCOUNTER — Telehealth: Payer: Self-pay | Admitting: *Deleted

## 2018-04-29 ENCOUNTER — Telehealth: Payer: Self-pay

## 2018-04-29 NOTE — Telephone Encounter (Signed)
No answer for post procedure call back. Left message for patient to call with questions or concerns. SM 

## 2018-04-29 NOTE — Telephone Encounter (Signed)
  Follow up Call-  Call back number 04/28/2018  Post procedure Call Back phone  # [phone] Horris Latino - wife  Permission to leave phone message Yes  Some recent data might be hidden     Patient questions:  Do you have a fever, pain , or abdominal swelling? No. Pain Score  0 *  Have you tolerated food without any problems? Yes.    Have you been able to return to your normal activities? Yes.    Do you have any questions about your discharge instructions: Diet   No. Medications  No. Follow up visit  No.  Do you have questions or concerns about your Care? No.  Actions: * If pain score is 4 or above: No action needed, pain <4.

## 2018-05-01 ENCOUNTER — Other Ambulatory Visit: Payer: Self-pay

## 2018-05-01 ENCOUNTER — Ambulatory Visit: Payer: BLUE CROSS/BLUE SHIELD

## 2018-05-01 DIAGNOSIS — E349 Endocrine disorder, unspecified: Secondary | ICD-10-CM

## 2018-05-01 MED ORDER — TESTOSTERONE CYPIONATE 200 MG/ML IM SOLN
200.0000 mg | INTRAMUSCULAR | Status: DC
  Administered 2018-05-01: 200 mg via INTRAMUSCULAR

## 2018-05-01 MED ORDER — CONTINUOUS BLOOD GLUC SENSOR MISC: 1.0000 | 6 refills | Status: AC

## 2018-05-01 NOTE — Progress Notes (Signed)
Patient here for a NV for Testosterone Cypionate 200 mg/ml 2 ml IM injection in theRIGHTupper outer quadrant. Patient tolerated well. 

## 2018-05-02 ENCOUNTER — Encounter: Payer: Self-pay | Admitting: Gastroenterology

## 2018-05-19 ENCOUNTER — Other Ambulatory Visit: Payer: Self-pay

## 2018-05-19 DIAGNOSIS — N182 Chronic kidney disease, stage 2 (mild): Secondary | ICD-10-CM

## 2018-05-19 DIAGNOSIS — E1122 Type 2 diabetes mellitus with diabetic chronic kidney disease: Secondary | ICD-10-CM

## 2018-05-19 DIAGNOSIS — Z794 Long term (current) use of insulin: Secondary | ICD-10-CM

## 2018-05-19 MED ORDER — METFORMIN HCL ER 500 MG PO TB24: ORAL_TABLET | ORAL | 1 refills | Status: DC

## 2018-05-21 ENCOUNTER — Other Ambulatory Visit: Payer: Self-pay | Admitting: Physician Assistant

## 2018-05-21 ENCOUNTER — Other Ambulatory Visit: Payer: Self-pay | Admitting: Internal Medicine

## 2018-05-21 DIAGNOSIS — R300 Dysuria: Secondary | ICD-10-CM

## 2018-05-23 ENCOUNTER — Encounter: Payer: Self-pay | Admitting: Adult Health

## 2018-05-23 ENCOUNTER — Ambulatory Visit: Payer: BLUE CROSS/BLUE SHIELD | Admitting: Adult Health

## 2018-05-23 VITALS — BP 122/80 | HR 62 | Temp 97.7°F | Ht 70.0 in | Wt 253.0 lb

## 2018-05-23 DIAGNOSIS — E119 Type 2 diabetes mellitus without complications: Secondary | ICD-10-CM

## 2018-05-23 DIAGNOSIS — E349 Endocrine disorder, unspecified: Secondary | ICD-10-CM | POA: Diagnosis not present

## 2018-05-23 DIAGNOSIS — Z79899 Other long term (current) drug therapy: Secondary | ICD-10-CM

## 2018-05-23 DIAGNOSIS — E1122 Type 2 diabetes mellitus with diabetic chronic kidney disease: Secondary | ICD-10-CM

## 2018-05-23 DIAGNOSIS — N182 Chronic kidney disease, stage 2 (mild): Secondary | ICD-10-CM

## 2018-05-23 DIAGNOSIS — Z794 Long term (current) use of insulin: Secondary | ICD-10-CM

## 2018-05-23 MED ORDER — TESTOSTERONE CYPIONATE 200 MG/ML IM SOLN
200.0000 mg | Freq: Once | INTRAMUSCULAR | Status: AC
Start: 2018-05-23 — End: 2018-05-23
  Administered 2018-05-23: 200 mg via INTRAMUSCULAR

## 2018-05-23 MED ORDER — ATORVASTATIN CALCIUM 40 MG PO TABS: ORAL_TABLET | ORAL | 1 refills | Status: DC

## 2018-05-23 NOTE — Progress Notes (Signed)
Patient here for a NV for Testosterone Cypionate 200 mg/ml 2 ml IM injection in the LEFT upper outer quadrant. Patient tolerated well. 

## 2018-05-23 NOTE — Patient Instructions (Addendum)
Remember to schedule your diabetic eye exam for this year   A1C (<7), blood pressure (<130/80), and cholesterol (LDL <70) Eye Exam yearly and Dental Exam every 6 months.     Type 2 Diabetes Mellitus, Self Care, Adult When you have type 2 diabetes (type 2 diabetes mellitus), you must keep your blood sugar (glucose) under control. You can do this with:  Nutrition.  Exercise.  Lifestyle changes.  Medicines or insulin, if needed.  Support from your doctors and others.  How do I manage my blood sugar?  Check your blood sugar level every day, as often as told.  Call your doctor if your blood sugar is above your goal numbers for 2 tests in a row.  Have your A1c (hemoglobin A1c) level checked at least two times a year. Have it checked more often if your doctor tells you to. Your doctor will set treatment goals for you. Generally, you should have these blood sugar levels:  Before meals (preprandial): 80-130 mg/dL (4.4-7.2 mmol/L).  After meals (postprandial): lower than 180 mg/dL (10 mmol/L).  A1c level: less than 7%.  What do I need to know about high blood sugar? High blood sugar is called hyperglycemia. Know the signs of high blood sugar. Signs may include:  Feeling: ? Thirsty. ? Hungry. ? Very tired.  Needing to pee (urinate) more than usual.  Blurry vision.  What do I need to know about low blood sugar? Low blood sugar is called hypoglycemia. This is when blood sugar is at or below 70 mg/dL (3.9 mmol/L). Symptoms may include:  Feeling: ? Hungry. ? Worried or nervous (anxious). ? Sweaty and clammy. ? Confused. ? Dizzy. ? Sleepy. ? Sick to your stomach (nauseous).  Having: ? A fast heartbeat (palpitations). ? A headache. ? A change in your vision. ? Jerky movements that you cannot control (seizure). ? Nightmares. ? Tingling or no feeling (numbness) around the mouth, lips, or tongue.  Having trouble with: ? Talking. ? Paying attention  (concentrating). ? Moving (coordination). ? Sleeping.  Shaking.  Passing out (fainting).  Getting upset easily (irritability).  Treating low blood sugar  To treat low blood sugar, eat or drink something sugary right away. If you can think clearly and swallow safely, follow the 15:15 rule:  Take 15 grams of a fast-acting carb (carbohydrate). Some fast-acting carbs are: ? 1 tube of glucose gel. ? 3 sugar tablets (glucose pills). ? 6-8 pieces of hard candy. ? 4 oz (120 mL) of fruit juice. ? 4 oz (120 mL) regular (not diet) soda.  Check your blood sugar 15 minutes after you take the carb.  If your blood sugar is still at or below 70 mg/dL (3.9 mmol/L), take 15 grams of a carb again.  If your blood sugar does not go above 70 mg/dL (3.9 mmol/L) after 3 tries, get help right away.  After your blood sugar goes back to normal, eat a meal or a snack within 1 hour.  Treating very low blood sugar If your blood sugar is at or below 54 mg/dL (3 mmol/L), you have very low blood sugar (severe hypoglycemia). This is an emergency. Do not wait to see if the symptoms will go away. Get medical help right away. Call your local emergency services (911 in the [place] Do not drive yourself to the hospital. If you have very low blood sugar and you cannot eat or drink, you may need a glucagon shot (injection). A family member or friend should learn how to check  your blood sugar and how to give you a glucagon shot. Ask your doctor if you need to have a glucagon shot kit at home. What else is important to manage my diabetes? Medicine Follow these instructions about insulin and diabetes medicines:  Take them as told by your doctor.  Adjust them as told by your doctor.  Do not run out of them.  Having diabetes can raise your risk for other long-term conditions. These include heart or kidney disease. Your doctor may prescribe medicines to help prevent problems from diabetes. Food   Make healthy food  choices. These include: ? Chicken, fish, egg whites, and beans. ? Oats, whole wheat, bulgur, brown rice, quinoa, and millet. ? Fresh fruits and vegetables. ? Low-fat dairy products. ? Nuts, avocado, olive oil, and canola oil.  Make a food plan with a specialist (dietitian).  Follow instructions from your doctor about what you cannot eat or drink.  Drink enough fluid to keep your pee (urine) clear or pale yellow.  Eat healthy snacks between healthy meals.  Keep track of carbs that you eat. Read food labels. Learn food serving sizes.  Follow your sick day plan when you cannot eat or drink normally. Make this plan with your doctor so it is ready to use. Activity  Exercise at least 3 times a week.  Do not go more than 2 days without exercising.  Talk with your doctor before you start a new exercise. Your doctor may need to adjust your insulin, medicines, or food. Lifestyle   Do not use any tobacco products. These include cigarettes, chewing tobacco, and e-cigarettes.If you need help quitting, ask your doctor.  Ask your doctor how much alcohol is safe for you.  Learn to deal with stress. If you need help with this, ask your doctor. Body care  Stay up to date with your shots (immunizations).  Have your eyes and feet checked by a doctor as often as told.  Check your skin and feet every day. Check for cuts, bruises, redness, blisters, or sores.  Brush your teeth and gums two times a day.  Floss at least one time a day.  Go to the dentist least one time every 6 months.  Stay at a healthy weight. General instructions   Take over-the-counter and prescription medicines only as told by your doctor.  Share your diabetes care plan with: ? Your work or school. ? People you live with.  Check your pee (urine) for ketones: ? When you are sick. ? As told by your doctor.  Carry a card or wear jewelry that says that you have diabetes.  Ask your doctor: ? Do I need to meet  with a diabetes educator? ? Where can I find a support group for people with diabetes?  Keep all follow-up visits as told by your doctor. This is important. Where to find more information: To learn more about diabetes, visit:  American Diabetes Association: www.diabetes.org  American Association of Diabetes Educators: www.diabeteseducator.org/patient-resources  This information is not intended to replace advice given to you by your health care provider. Make sure you discuss any questions you have with your health care provider. Document Released: 03/19/2016 Document Revised: 05/03/2016 Document Reviewed: 12/30/2015 Elsevier Interactive Patient Education  [employer].

## 2018-05-23 NOTE — Progress Notes (Signed)
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 His diabetes has been difficult to manage, previously very poorly compliant with diet. At last visit on 04/17/2018, A1C was noted to be 11.7%. Glucometer Elenor Legato was ordered at that time; presents today for review of glucose logs and progress evaluation.   Logs last 2 weeks' averages demonstrate : 12pm - 6am: 112 6am-6pm: 131 6pm-12 pm: 124  Net average he is maintaining below 130 for past 2 weeks, with 1 significant spike (he admits he had chips and queso), and no hypoglycemic episodes  Review of 2 weeks prior to that demonstrates much more frequent spikes above 130, ranging ~250, with 2 episodes of hypoglycemia  He reports based on [place] reports he is eating more frequently  He has been following a mediterranian diet - no fried foods, uses olive oil, lots of salads, grilled chicken breast with grilled vegetables, may add some cheese. He also does hibachi - chicken or steak with vegetables, skips the rice. He has nuts for breakfast.   Drinking unsweet tea, water, coke zero  Walking 2-3 miles daily per his pedometer  He is currently on a sliding scale for novolin 70/30 - takes 15 units in AM for fasting sugar above 130, taking 10 units in the evening if PM sugar is over 130. He reports several days in past 2 weeks where he has not needed insulin at all per parameters.   Last hemoglobin A1c was: Lab Results  Component Value Date   HGBA1C 11.7 (H) 04/17/2018   HGBA1C 11.0 (H) 01/07/2018   HGBA1C 9.2 (H) 05/30/2017   BMI is Body mass index is 36.3  kg/m. Wt Readings from Last 3 Encounters:  05/23/18 253 lb (114.8 kg)  04/28/18 253 lb (114.8 kg)  04/17/18 258 lb (117 kg)   Pt is on a regimen of:  Metformin 1000 mg BID, novolin 70/30   Patient does have CKD He is on ACE/ARB  Lab Results  Component Value Date   GFRAA 69 04/17/2018   Lab Results  Component Value Date   CREATININE 1.33 04/17/2018   BUN 22 04/17/2018   NA 137 04/17/2018   K 4.3 04/17/2018   CL 103 04/17/2018   CO2 27 04/17/2018    Lab Results  Component Value Date   MICROALBUR 6.1 01/07/2018    He is on a Statin (atorvastatin 20 mg daily)  He is not at goal of less than 70.  Lab Results  Component Value Date   CHOL 129 04/17/2018   HDL 30 (L) 04/17/2018   LDLCALC 81 04/17/2018   TRIG 101 04/17/2018   CHOLHDL 4.3 04/17/2018   He reports he has had diabetic eye exam last year (2018) by Dr. Einar Gip, is due to follow up for this year. Reports requested.   Hasn't seen a dentist since his passed away, encouraged to find new and follow up biannually.    Problem List has Nonallergic rhinitis; Obstructive sleep apnea-questioned; Hypertension; Hyperlipidemia; Type 2 diabetes mellitus, controlled, with renal complications ([place]); [name]; DM (diabetes mellitus), type 2 ([place]); Testosterone deficiency; Morbid obesity (BMI 36+); Poor compliance with diet; Medication management; Diabetes mellitus with circulatory disorder causing  erectile dysfunction ([place]); Insulin-requiring or dependent type II diabetes mellitus ([place]); and CKD stage 2 due to type 2 diabetes mellitus ([place]) on their problem list.  Medications Current Outpatient Medications on File Prior to Visit  Medication Sig  . amLODipine (NORVASC) 10 MG tablet TAKE 1 TABLET BY MOUTH EVERY DAY  . atorvastatin (LIPITOR) 20 MG tablet TAKE 1 TABLET (20 MG TOTAL) BY MOUTH DAILY.  . BD VEO INSULIN SYRINGE U/F 31G X 15/64" 0.5 ML MISC USE TWO PENS DAILY WITH INSULIN  . bisoprolol (ZEBETA) 10 MG  tablet TAKE 1 TABLET BY MOUTH EVERY DAY  . ciclesonide (OMNARIS) 50 MCG/ACT nasal spray Place 2 sprays into both nostrils daily as needed for allergies.   . clotrimazole-betamethasone (LOTRISONE) cream APPLY 3 TIMES A DAY AS NEEDED  . Continuous Blood Gluc Sensor (FREESTYLE LIBRE SENSOR SYSTEM) MISC 1 device with sensors DX Z79.4 insulin dependent diabetes  . Continuous Blood Gluc Sensor MISC 1 each by Does not apply route as directed. Use as directed every 10 days. May dispense FreeStyle [employer] or similar.  . ezetimibe (ZETIA) 10 MG tablet Take 10 mg by mouth daily.  . fluconazole (DIFLUCAN) 150 MG tablet TAKE 1 TABLET BY MOUTH EVERY DAY WITH FOOD  . glucose blood (FREESTYLE LITE) test strip Check blood sugar 3 times a day or as directed. [id number]  . metFORMIN (GLUCOPHAGE XR) 500 MG 24 hr tablet Take 2 tablets 2 x day for Diabetes  . NOVOLIN 70/30 (70-30) 100 UNIT/ML injection INJECT UP TO 30 UNITS IN MORNING WITH FOOD & UP TO 30 UNITS WITH EVENING MEAL,ADJUST TO SUGARS  . senna-docusate (SENOKOT-S) 8.6-50 MG tablet Take by mouth.  . tadalafil (CIALIS) 20 MG tablet Take 1/2 to 1 tablet every 2-3 days as needed (Patient taking differently: Take 10-20 mg by mouth daily as needed for erectile dysfunction. Take 1/2 to 1 tablet every 2-3 days as needed)  . telmisartan (MICARDIS) 40 MG tablet Take 1 tablet (40 mg total) by mouth daily.  Marland Kitchen testosterone cypionate (DEPOTESTOSTERONE CYPIONATE) 200 MG/ML injection INJECT 2 ML INTO THE MUSCLE EVERY 2 WEEKS  . Vitamin D, Ergocalciferol, (DRISDOL) 50000 units CAPS capsule TAKE ONE CAPSULE EVERY DAY OR AS DIRECTED   Current Facility-Administered Medications on File Prior to Visit  Medication  . 0.9 %  sodium chloride infusion  . testosterone cypionate (DEPOTESTOSTERONE CYPIONATE) injection 200 mg  . testosterone cypionate (DEPOTESTOSTERONE CYPIONATE) injection 200 mg    ROS- see HPI  Physical Exam: Blood pressure 122/80, pulse 62,  temperature 97.7 F (36.5 C), height 5\' 10"  (1.778 m), weight 253 lb (114.8 kg), SpO2 97 %. Body mass index is 36.3 kg/m. General Appearance: Well nourished, in no apparent distress. Eyes: PERRLA, EOMs, conjunctiva no swelling or erythema ENT/Mouth: Ext aud canals clear, TMs without erythema, bulging. No erythema, swelling, or exudate on post pharynx.  Tonsils not swollen or erythematous. Hearing normal.  Respiratory: Respiratory effort normal, BS equal bilaterally without rales, rhonchi, wheezing or stridor.  Cardio: RRR with no MRGs. Brisk peripheral pulses without edema.  Abdomen: Soft, + BS.  Non tender, no guarding, rebound, hernias, masses. Musculoskeletal: Full ROM, 5/5 strength, normal gait.  Skin: Warm, dry without rashes, lesions, ecchymosis.  Neuro: Cranial nerves intact. Normal muscle tone, no cerebellar symptoms. Sensation intact to monofilament.     Plan and Assessment: Diabetes Education: Reviewed 'ABCs' of diabetes management (respective goals in parentheses):  A1C (<7), blood pressure (<130/80), and cholesterol (LDL <70) Atorvastatin increased from 20 mg  to 40 mg daily Eye Exam yearly and Dental Exam every 6 months.- reminded he is due for this year- last year report requested Continue with current diet/ insulin schedule; discussed we will monitor weight and possibly discuss reducing overall calories to encourage weight loss   Future Appointments  Date Time Provider [place]  08/12/2018  8:45 AM Liane Comber, NP [facility] None  01/13/2019  9:00 AM Vicie Mutters, PA-C [facility] None

## 2018-05-25 LAB — FRUCTOSAMINE: Fructosamine: 257 umol/L (ref 190–270)

## 2018-05-28 ENCOUNTER — Other Ambulatory Visit: Payer: Self-pay | Admitting: Internal Medicine

## 2018-06-02 ENCOUNTER — Other Ambulatory Visit: Payer: Self-pay | Admitting: Physician Assistant

## 2018-06-11 DIAGNOSIS — E119 Type 2 diabetes mellitus without complications: Secondary | ICD-10-CM | POA: Diagnosis not present

## 2018-06-11 LAB — HM DIABETES EYE EXAM

## 2018-06-27 ENCOUNTER — Other Ambulatory Visit: Payer: Self-pay | Admitting: Internal Medicine

## 2018-07-10 ENCOUNTER — Encounter: Payer: Self-pay | Admitting: Internal Medicine

## 2018-07-12 ENCOUNTER — Other Ambulatory Visit: Payer: Self-pay | Admitting: Internal Medicine

## 2018-07-22 ENCOUNTER — Other Ambulatory Visit: Payer: Self-pay | Admitting: Physician Assistant

## 2018-07-22 DIAGNOSIS — N182 Chronic kidney disease, stage 2 (mild): Secondary | ICD-10-CM

## 2018-07-22 DIAGNOSIS — N183 Chronic kidney disease, stage 3 unspecified: Secondary | ICD-10-CM

## 2018-07-22 DIAGNOSIS — E1122 Type 2 diabetes mellitus with diabetic chronic kidney disease: Secondary | ICD-10-CM

## 2018-07-22 DIAGNOSIS — E119 Type 2 diabetes mellitus without complications: Secondary | ICD-10-CM

## 2018-07-22 DIAGNOSIS — I10 Essential (primary) hypertension: Secondary | ICD-10-CM

## 2018-07-22 DIAGNOSIS — E1159 Type 2 diabetes mellitus with other circulatory complications: Secondary | ICD-10-CM

## 2018-07-22 DIAGNOSIS — Z794 Long term (current) use of insulin: Secondary | ICD-10-CM

## 2018-07-22 DIAGNOSIS — N521 Erectile dysfunction due to diseases classified elsewhere: Secondary | ICD-10-CM

## 2018-07-26 ENCOUNTER — Other Ambulatory Visit: Payer: Self-pay | Admitting: Internal Medicine

## 2018-07-28 ENCOUNTER — Other Ambulatory Visit: Payer: Self-pay | Admitting: Internal Medicine

## 2018-08-07 NOTE — Progress Notes (Signed)
FOLLOW UP  Assessment and Plan:   Hypertension Well controlled with current medications  Monitor blood pressure at home; patient to call if consistently greater than 130/80 Continue DASH diet.   Reminder to go to the ER if any CP, SOB, nausea, dizziness, severe HA, changes vision/speech, left arm numbness and tingling and jaw pain.  Cholesterol Currently mildly above goal; atorvastatin 20 mg daily, zetia 10 mg daily  Continue low cholesterol diet and exercise.  Check lipid panel.   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  Perform daily foot/skin check, notify office of any concerning changes.  Check A1C  Obesity with co morbidities Long discussion about weight loss, diet, and exercise Recommended diet heavy in fruits and veggies and low in animal meats, cheeses, and dairy products, appropriate calorie intake Discussed ideal weight for height and initial weight goal (230 lb) Will follow up in 3 months  Vitamin D Def At goal at last visit; continue supplementation to maintain goal of 70-100 Defer Vit D level  Hypogonadism - continue to monitor, states medication is helping with symptoms of low T.  - Administered today  Continue diet and meds as discussed. Further disposition pending results of labs. Discussed med's effects and SE's.   Over 30 minutes of exam, counseling, chart review, and critical decision making was performed.   Future Appointments  Date Time Provider [place]  01/13/2019  9:00 AM Vicie Mutters, PA-C [facility] None    ----------------------------------------------------------------------------------------------------------------------  HPI 57 y.o. male  presents for 3 month follow up on  hypertension, cholesterol, diabetes, weight, hypogonadism and [name].   BMI is Body mass index is 35.3 kg/m., he has been working on diet and exercise. Wt Readings from Last 3 Encounters:  08/12/18 246 lb (111.6 kg)  05/23/18 253 lb (114.8 kg)  04/28/18 253 lb (114.8 kg)   His blood pressure has been controlled at home, today their BP is BP: 110/76  He does workout. He denies chest pain, shortness of breath, dizziness.   He is on cholesterol medication (atorvastatin 20 mg daily, zetia 10 mg daily) and denies myalgias. His cholesterol is not at goal. The cholesterol last visit was:   Lab Results  Component Value Date   CHOL 129 04/17/2018   HDL 30 (L) 04/17/2018   LDLCALC 81 04/17/2018   TRIG 101 04/17/2018   CHOLHDL 4.3 04/17/2018    He has been working on diet and exercise for T2 diabetes treated by metformin and novolin 70/30, recently started with continuous glucose monitoring device with significantly improved recorded home glucose by eating small, regular meals, (average 90 day glucose reading indicates 117), he has had some low glucose (53 x 2 in the afternoon) and he has cut down on insulin intake, taking 7-8 units in AM/PM if 130+ and denies foot ulcerations, hyperglycemia, increased appetite, nausea, paresthesia of the feet, polydipsia, polyuria, visual disturbances and vomiting. Last A1C in the office was:  Lab Results  Component Value Date   HGBA1C 11.7 (H) 04/17/2018   Patient is on Vitamin D supplement.   Lab Results  Component Value Date   VD25OH 31 01/07/2018     He has a history of testosterone deficiency and is on testosterone replacement, last injection in June, has been unable to present  for injection. He states that the testosterone helps with his energy, libido, muscle mass. Lab Results  Component Value Date   TESTOSTERONE 320 02/01/2016     Current Medications:  Current Outpatient Medications on File Prior to Visit  Medication Sig  .  amLODipine (NORVASC) 10 MG tablet TAKE 1 TABLET BY MOUTH EVERY DAY  . atorvastatin (LIPITOR) 20 MG tablet TAKE 1 TABLET BY MOUTH EVERY DAY  . BD VEO INSULIN SYRINGE U/F 31G X 15/64" 0.5 ML MISC USE TWO PENS DAILY WITH INSULIN  . bisoprolol (ZEBETA) 10 MG tablet TAKE 1 TABLET BY MOUTH EVERY DAY  . ciclesonide (OMNARIS) 50 MCG/ACT nasal spray Place 2 sprays into both nostrils daily as needed for allergies.   . clotrimazole-betamethasone (LOTRISONE) cream APPLY 3 TIMES A DAY AS NEEDED  . Continuous Blood Gluc Sensor (FREESTYLE LIBRE 14 DAY SENSOR) MISC USE AS DIRECTED  . Continuous Blood Gluc Sensor (FREESTYLE LIBRE SENSOR SYSTEM) MISC 1 device with sensors DX Z79.4 insulin dependent diabetes  . Continuous Blood Gluc Sensor MISC 1 each by Does not apply route as directed. Use as directed every 10 days. May dispense FreeStyle [employer] or similar.  . ezetimibe (ZETIA) 10 MG tablet TAKE 1 TABLET BY MOUTH EVERY DAY  . fluconazole (DIFLUCAN) 150 MG tablet TAKE 1 TABLET BY MOUTH EVERY DAY WITH FOOD (Patient taking differently: as needed. )  . glucose blood (FREESTYLE LITE) test strip Check blood sugar 3 times a day or as directed. DX-E11.22  . metFORMIN (GLUCOPHAGE XR) 500 MG 24 hr tablet Take 2 tablets 2 x day for Diabetes  . NOVOLIN 70/30 (70-30) 100 UNIT/ML injection INJECT UP TO 30 UNITS IN MORNING WITH FOOD & UP TO 30 UNITS WITH EVENING MEAL,ADJUST TO SUGARS  . senna-docusate (SENOKOT-S) 8.6-50 MG tablet Take by mouth.  . tadalafil (CIALIS) 20 MG tablet Take 1/2 to 1 tablet every 2-3 days as needed (Patient taking differently: Take 10-20 mg by mouth daily as needed for erectile dysfunction. Take 1/2 to 1 tablet every 2-3 days as needed)  . telmisartan (MICARDIS) 40 MG tablet TAKE 1 TABLET BY MOUTH EVERY DAY  . testosterone cypionate (DEPOTESTOSTERONE CYPIONATE) 200 MG/ML injection INJECT 2 ML INTO THE MUSCLE EVERY 2 WEEKS  . Vitamin D, Ergocalciferol, (DRISDOL) 50000 units CAPS capsule TAKE  ONE CAPSULE EVERY DAY OR AS DIRECTED   Current Facility-Administered Medications on File Prior to Visit  Medication  . 0.9 %  sodium chloride infusion  . testosterone cypionate (DEPOTESTOSTERONE CYPIONATE) injection 200 mg  . testosterone cypionate (DEPOTESTOSTERONE CYPIONATE) injection 200 mg     Allergies:  Allergies  Allergen Reactions  . 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 Allergic rhinitis   . Allergy   . Chronic sinusitis   . Diabetes mellitus ([place])   . Hyperlipidemia   . Hypertension   . Hypogonadism male   . Perinephric hematoma 07/16/2015   Family history- Reviewed and unchanged Social history- Reviewed and unchanged   Review of Systems:  Review of Systems  Constitutional: Negative for malaise/fatigue and weight loss.  HENT: Negative for hearing loss and tinnitus.   Eyes: Negative for blurred vision and double vision.  Respiratory: Negative for cough, shortness of breath and wheezing.   Cardiovascular: Negative for chest pain, palpitations, orthopnea, claudication and leg swelling.  Gastrointestinal: Negative for abdominal pain, blood  in stool, constipation, diarrhea, heartburn, melena, nausea and vomiting.  Genitourinary: Negative.   Musculoskeletal: Negative for joint pain and myalgias.  Skin: Negative for rash.  Neurological: Negative for dizziness, tingling, sensory change, weakness and headaches.  Endo/Heme/Allergies: Negative for polydipsia.  Psychiatric/Behavioral: Negative.   All other systems reviewed and are negative.     Physical Exam: BP 110/76   Pulse 69   Temp (!) 97.5 F (36.4 C)   Ht 5\' 10"  (1.778 m)   Wt 246 lb (111.6 kg)   SpO2 95%   BMI 35.30 kg/m  Wt Readings from Last 3 Encounters:  08/12/18 246 lb (111.6 kg)  05/23/18 253 lb (114.8 kg)  04/28/18 253 lb (114.8 kg)   General Appearance:  Well nourished, in no apparent distress. Eyes: PERRLA, EOMs, conjunctiva no swelling or erythema Sinuses: No Frontal/maxillary tenderness ENT/Mouth: Ext aud canals clear, TMs without erythema, bulging. No erythema, swelling, or exudate on post pharynx.  Tonsils not swollen or erythematous. Hearing normal.  Neck: Supple, thyroid normal.  Respiratory: Respiratory effort normal, BS equal bilaterally without rales, rhonchi, wheezing or stridor.  Cardio: RRR with no MRGs. Brisk peripheral pulses without edema.  Abdomen: Soft, + BS.  Non tender, no guarding, rebound, hernias, masses. Lymphatics: Non tender without lymphadenopathy.  Musculoskeletal: Full ROM, 5/5 strength, Normal gait Skin: Warm, dry without rashes, lesions, ecchymosis.  Neuro: Cranial nerves intact. No cerebellar symptoms.  Psych: Awake and oriented X 3, normal affect, Insight and Judgment appropriate.    Izora Ribas, NP 9:07 AM [facility]

## 2018-08-12 ENCOUNTER — Ambulatory Visit: Payer: BLUE CROSS/BLUE SHIELD | Admitting: Adult Health

## 2018-08-12 ENCOUNTER — Encounter: Payer: Self-pay | Admitting: Adult Health

## 2018-08-12 VITALS — BP 110/76 | HR 69 | Temp 97.5°F | Ht 70.0 in | Wt 246.0 lb

## 2018-08-12 DIAGNOSIS — I10 Essential (primary) hypertension: Secondary | ICD-10-CM | POA: Diagnosis not present

## 2018-08-12 DIAGNOSIS — E1122 Type 2 diabetes mellitus with diabetic chronic kidney disease: Secondary | ICD-10-CM | POA: Diagnosis not present

## 2018-08-12 DIAGNOSIS — E1169 Type 2 diabetes mellitus with other specified complication: Secondary | ICD-10-CM

## 2018-08-12 DIAGNOSIS — Z794 Long term (current) use of insulin: Secondary | ICD-10-CM

## 2018-08-12 DIAGNOSIS — N182 Chronic kidney disease, stage 2 (mild): Secondary | ICD-10-CM

## 2018-08-12 DIAGNOSIS — Z79899 Other long term (current) drug therapy: Secondary | ICD-10-CM | POA: Diagnosis not present

## 2018-08-12 DIAGNOSIS — N183 Chronic kidney disease, stage 3 unspecified: Secondary | ICD-10-CM

## 2018-08-12 DIAGNOSIS — E785 Hyperlipidemia, unspecified: Secondary | ICD-10-CM | POA: Diagnosis not present

## 2018-08-12 DIAGNOSIS — E559 Vitamin D deficiency, unspecified: Secondary | ICD-10-CM | POA: Diagnosis not present

## 2018-08-12 DIAGNOSIS — E349 Endocrine disorder, unspecified: Secondary | ICD-10-CM

## 2018-08-12 MED ORDER — TESTOSTERONE CYPIONATE 200 MG/ML IM SOLN
200.0000 mg | Freq: Once | INTRAMUSCULAR | Status: AC
  Administered 2018-08-12: 200 mg via INTRAMUSCULAR

## 2018-08-12 NOTE — Patient Instructions (Addendum)
Goals    . HEMOGLOBIN A1C < 8.0    . LDL CALC < 70    . Weight (lb) < 230 lb (104.3 kg)       Know what a healthy weight is for you (roughly BMI <25) and aim to maintain this  Aim for 7+ servings of fruits and vegetables daily  65-80+ fluid ounces of water or unsweet tea for healthy kidneys  Limit to max 1 drink of alcohol per day; avoid smoking/tobacco  Limit animal fats in diet for cholesterol and heart health - choose grass fed whenever available  Avoid highly processed foods, and foods high in saturated/trans fats  Aim for low stress - take time to unwind and care for your mental health  Aim for 150 min of moderate intensity exercise weekly for heart health, and weights twice weekly for bone health  Aim for 7-9 hours of sleep daily

## 2018-08-12 NOTE — Progress Notes (Signed)
Patient here for a NV for Testosterone Cypionate 200 mg/ml 2 ml IM injection in the LEFT upper outer quadrant. Patient tolerated well.

## 2018-08-12 NOTE — Addendum Note (Signed)
Addended by: Chancy Hurter on: 08/12/2018 09:33 AM   Modules accepted: Orders

## 2018-08-13 LAB — CBC WITH DIFFERENTIAL/PLATELET
Basophils Absolute: 28 cells/uL (ref 0–200)
Basophils Relative: 0.5 %
Eosinophils Absolute: 336 cells/uL (ref 15–500)
Eosinophils Relative: 6 %
HCT: 44.2 % (ref 38.5–50.0)
Hemoglobin: 14.3 g/dL (ref 13.2–17.1)
Lymphs Abs: 1831 cells/uL (ref 850–3900)
MCH: 26.7 pg — ABNORMAL LOW (ref 27.0–33.0)
MCHC: 32.4 g/dL (ref 32.0–36.0)
MCV: 82.5 fL (ref 80.0–100.0)
MPV: 9.6 fL (ref 7.5–12.5)
Monocytes Relative: 11.4 %
Neutro Abs: 2766 cells/uL (ref 1500–7800)
Neutrophils Relative %: 49.4 %
Platelets: 324 10*3/uL (ref 140–400)
RBC: 5.36 10*6/uL (ref 4.20–5.80)
RDW: 14 % (ref 11.0–15.0)
Total Lymphocyte: 32.7 %
WBC mixed population: 638 cells/uL (ref 200–950)
WBC: 5.6 10*3/uL (ref 3.8–10.8)

## 2018-08-13 LAB — COMPLETE METABOLIC PANEL WITH GFR
AG Ratio: 1.6 (calc) (ref 1.0–2.5)
ALT: 11 U/L (ref 9–46)
AST: 12 U/L (ref 10–35)
Albumin: 4.5 g/dL (ref 3.6–5.1)
Alkaline phosphatase (APISO): 66 U/L (ref 40–115)
BUN: 15 mg/dL (ref 7–25)
CO2: 29 mmol/L (ref 20–32)
Calcium: 9.7 mg/dL (ref 8.6–10.3)
Chloride: 102 mmol/L (ref 98–110)
Creat: 1.17 mg/dL (ref 0.70–1.33)
GFR, Est African American: 81 mL/min/{1.73_m2} (ref >=60)
GFR, Est Non African American: 70 mL/min/{1.73_m2} (ref >=60)
Globulin: 2.8 g/dL (calc) (ref 1.9–3.7)
Glucose, Bld: 134 mg/dL — ABNORMAL HIGH (ref 65–99)
Potassium: 4.5 mmol/L (ref 3.5–5.3)
Sodium: 139 mmol/L (ref 135–146)
Total Bilirubin: 0.5 mg/dL (ref 0.2–1.2)
Total Protein: 7.3 g/dL (ref 6.1–8.1)

## 2018-08-13 LAB — LIPID PANEL
Cholesterol: 108 mg/dL (ref <200)
HDL: 28 mg/dL — ABNORMAL LOW (ref >40)
LDL Cholesterol (Calc): 67 mg/dL (calc)
Non-HDL Cholesterol (Calc): 80 mg/dL (calc) (ref <130)
Total CHOL/HDL Ratio: 3.9 (calc) (ref <5.0)
Triglycerides: 58 mg/dL (ref <150)

## 2018-08-13 LAB — HEMOGLOBIN A1C
Hgb A1c MFr Bld: 7.4 % of total Hgb — ABNORMAL HIGH (ref <5.7)
Mean Plasma Glucose: 166 (calc)
eAG (mmol/L): 9.2 (calc)

## 2018-08-13 LAB — TSH: TSH: 1.49 mIU/L (ref 0.40–4.50)

## 2018-08-13 LAB — MAGNESIUM: Magnesium: 1.9 mg/dL (ref 1.5–2.5)

## 2018-08-21 DIAGNOSIS — I10 Essential (primary) hypertension: Secondary | ICD-10-CM | POA: Diagnosis not present

## 2018-08-21 DIAGNOSIS — S37019A Minor contusion of unspecified kidney, initial encounter: Secondary | ICD-10-CM | POA: Diagnosis not present

## 2018-08-21 DIAGNOSIS — E119 Type 2 diabetes mellitus without complications: Secondary | ICD-10-CM | POA: Diagnosis not present

## 2018-08-21 DIAGNOSIS — N179 Acute kidney failure, unspecified: Secondary | ICD-10-CM | POA: Diagnosis not present

## 2018-08-28 ENCOUNTER — Other Ambulatory Visit: Payer: Self-pay | Admitting: Internal Medicine

## 2018-08-28 MED ORDER — KETOCONAZOLE 2 % EX SHAM: 1.0000 "application " | MEDICATED_SHAMPOO | CUTANEOUS | 12 refills | Status: DC

## 2018-09-04 DIAGNOSIS — Z23 Encounter for immunization: Secondary | ICD-10-CM | POA: Diagnosis not present

## 2018-09-23 ENCOUNTER — Other Ambulatory Visit: Payer: Self-pay | Admitting: Internal Medicine

## 2018-10-06 ENCOUNTER — Other Ambulatory Visit: Payer: Self-pay | Admitting: Internal Medicine

## 2018-10-11 ENCOUNTER — Other Ambulatory Visit: Payer: Self-pay | Admitting: Physician Assistant

## 2018-10-13 ENCOUNTER — Ambulatory Visit: Payer: BLUE CROSS/BLUE SHIELD | Admitting: Adult Health

## 2018-10-13 ENCOUNTER — Encounter: Payer: Self-pay | Admitting: Adult Health

## 2018-10-13 VITALS — BP 120/74 | HR 91 | Temp 97.9°F | Ht 70.0 in | Wt 246.0 lb

## 2018-10-13 DIAGNOSIS — J00 Acute nasopharyngitis [common cold]: Secondary | ICD-10-CM | POA: Diagnosis not present

## 2018-10-13 DIAGNOSIS — E349 Endocrine disorder, unspecified: Secondary | ICD-10-CM | POA: Diagnosis not present

## 2018-10-13 MED ORDER — AZITHROMYCIN 250 MG PO TABS: ORAL_TABLET | ORAL | 1 refills | Status: AC

## 2018-10-13 MED ORDER — TESTOSTERONE CYPIONATE 200 MG/ML IM SOLN
200.0000 mg | Freq: Once | INTRAMUSCULAR | Status: AC
  Administered 2018-10-13: 200 mg via INTRAMUSCULAR

## 2018-10-13 MED ORDER — TESTOSTERONE CYPIONATE 200 MG/ML IM SOLN: INTRAMUSCULAR | 3 refills | Status: DC

## 2018-10-13 MED ORDER — PREDNISONE 20 MG PO TABS: ORAL_TABLET | ORAL | 0 refills | Status: DC

## 2018-10-13 MED ORDER — PROMETHAZINE-DM 6.25-15 MG/5ML PO SYRP: 5.0000 mL | ORAL_SOLUTION | Freq: Four times a day (QID) | ORAL | 1 refills | Status: DC | PRN

## 2018-10-13 NOTE — Progress Notes (Signed)
Assessment and Plan:  Russell [name] was seen today for uri.  Diagnoses and all orders for this visit:  Acute nasopharyngitis (common cold) Benign exam, day 2; likely simple viral URI; discussed the importance of avoiding unnecessary antibiotic therapy, typically self-limited duration of 5-7 days. Zpak printed to take only if significantly worsening/developing fever Suggested symptomatic OTC remedies. Nasal saline spray for congestion. Nasal steroids, allergy pill, oral steroids offered Follow up as needed. -     predniSONE (DELTASONE) 20 MG tablet; 2 tablets daily for 3 days, 1 tablet daily for 4 days. -     promethazine-dextromethorphan (PROMETHAZINE-DM) 6.25-15 MG/5ML syrup; Take 5 mLs by mouth 4 (four) times daily as needed for cough.   -     azithromycin (ZITHROMAX) 250 MG tablet; Take 2 tablets (500 mg) on  Day 1,  followed by 1 tablet (250 mg) once daily on Days 2 through 5.  Further disposition pending results of labs. Discussed med's effects and SE's.   Over 15 minutes of exam, counseling, chart review, and critical decision making was performed.   Future Appointments  Date Time Provider [place]  10/13/2018  2:00 PM Liane Comber, NP [facility] None  11/19/2018  8:45 AM Liane Comber, NP [facility] None  01/13/2019  9:00 AM Vicie Mutters, PA-C [facility] None    ------------------------------------------------------------------------------------------------------------------   HPI BP 120/74   Pulse 91   Temp 97.9 F (36.6 C)   Ht 5\' 10"  (1.778 m)   Wt 246 lb (111.6 kg)   SpO2 96%   BMI 35.30 kg/m   57 y.o.male former smoker, T2DM on insulin, presents for evaluation of URI symptoms that began yesterday afternoon; he reports began as sore throat, clear discharge, felt it was settling into his chest this AM and secretions turned yellow. He reports he started mucinex, and throat lozenges. He is here as he was told at one point by Dr. Melford Aase to be evaluated if mucus  had color.   He denies fever/chills, headaches, nasal congestion, dizziness, sinus pain/pressure, ear pressure.   Past Medical History:  Diagnosis Date  . Allergic rhinitis   . Allergy   . Chronic sinusitis   . Diabetes mellitus ([place])   . Hyperlipidemia   . Hypertension   . Hypogonadism male   . Perinephric hematoma 07/16/2015     Allergies  Allergen Reactions  . 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 amLODipine (NORVASC) 10 MG tablet TAKE 1 TABLET BY MOUTH EVERY DAY  . atorvastatin (LIPITOR) 20 MG tablet TAKE 1 TABLET BY MOUTH EVERY DAY  . BD VEO INSULIN SYRINGE U/F 31G X 15/64" 0.5 ML MISC USE TWO DAILY WITH INSULIN  . bisoprolol (ZEBETA) 10 MG tablet TAKE 1 TABLET BY MOUTH EVERY DAY  . ciclesonide (OMNARIS) 50 MCG/ACT nasal spray Place 2 sprays into both nostrils daily as needed for allergies.   . clotrimazole-betamethasone (LOTRISONE) cream APPLY 3 TIMES A DAY AS NEEDED  . Continuous Blood Gluc Sensor (FREESTYLE LIBRE 14 DAY SENSOR) MISC USE AS DIRECTED  . Continuous Blood Gluc Sensor (FREESTYLE LIBRE SENSOR SYSTEM) MISC 1 device with sensors DX Z79.4 insulin dependent diabetes  . Continuous Blood Gluc Sensor MISC 1 each by Does not apply route as directed. Use as directed every 10 days. May dispense FreeStyle [employer] or similar.  . ezetimibe (ZETIA) 10 MG tablet TAKE  1 TABLET BY MOUTH EVERY DAY  . fluconazole (DIFLUCAN) 150 MG tablet TAKE 1 TABLET BY MOUTH EVERY DAY WITH FOOD (Patient taking differently: as needed. )  . glucose blood (FREESTYLE LITE) test strip Check blood sugar 3 times a day or as directed. DX-E11.22  . ketoconazole (NIZORAL) 2 % shampoo Apply 1 application topically 2 (two) times a week. Apply  & shampoo 2 x /week  . metFORMIN (GLUCOPHAGE XR) 500 MG 24 hr tablet Take 2 tablets 2 x day for Diabetes   . NOVOLIN 70/30 (70-30) 100 UNIT/ML injection INJECT UP TO 30 UNITS IN MORNING WITH FOOD & UP TO 30 UNITS WITH EVENING MEAL,ADJUST TO SUGARS  . senna-docusate (SENOKOT-S) 8.6-50 MG tablet Take by mouth.  . tadalafil (CIALIS) 20 MG tablet Take 1/2 to 1 tablet every 2-3 days as needed (Patient taking differently: Take 10-20 mg by mouth daily as needed for erectile dysfunction. Take 1/2 to 1 tablet every 2-3 days as needed)  . telmisartan (MICARDIS) 40 MG tablet TAKE 1 TABLET BY MOUTH EVERY DAY  . testosterone cypionate (DEPOTESTOSTERONE CYPIONATE) 200 MG/ML injection INJECT 2 ML INTO THE MUSCLE EVERY 2 WEEKS  . Vitamin D, Ergocalciferol, (DRISDOL) 50000 units CAPS capsule TAKE ONE CAPSULE EVERY DAY OR AS DIRECTED   Current Facility-Administered Medications on File Prior to Visit  Medication  . 0.9 %  sodium chloride infusion  . testosterone cypionate (DEPOTESTOSTERONE CYPIONATE) injection 200 mg  . testosterone cypionate (DEPOTESTOSTERONE CYPIONATE) injection 200 mg    ROS: all negative except above.   Physical Exam:  BP 120/74   Pulse 91   Temp 97.9 F (36.6 C)   Ht 5\' 10"  (1.778 m)   Wt 246 lb (111.6 kg)   SpO2 96%   BMI 35.30 kg/m   General Appearance: Well nourished, in no apparent distress. Eyes: PERRLA, EOMs, conjunctiva no swelling or erythema Sinuses: No Frontal/maxillary tenderness ENT/Mouth: Ext aud canals clear, TMs without erythema, bulging. No erythema, swelling, or exudate on post pharynx.  Tonsils not swollen or erythematous. Hearing normal.  Neck: Supple, thyroid normal.  Respiratory: Respiratory effort normal, BS equal bilaterally without rales, rhonchi, wheezing or stridor.  Cardio: RRR with no MRGs. Brisk peripheral pulses without edema.  Abdomen: Soft, + BS.  Non tender Lymphatics: Non tender without lymphadenopathy.  Musculoskeletal: normal gait.  Skin: Warm, dry without rashes, lesions, ecchymosis.  Psych: Awake and oriented X 3, normal affect, Insight and  Judgment appropriate.     Izora Ribas, NP 1:45 PM [facility]

## 2018-10-13 NOTE — Patient Instructions (Addendum)

## 2018-10-13 NOTE — Addendum Note (Signed)
Addended by: Chancy Hurter on: 10/13/2018 02:01 PM   Modules accepted: Orders

## 2018-10-13 NOTE — Addendum Note (Signed)
Addended by: Izora Ribas on: 10/13/2018 02:02 PM   Modules accepted: Orders

## 2018-10-13 NOTE — Progress Notes (Signed)
Patient here for a NV for Testosterone Cypionate 200 mg/ml 2 ml IM injection in theRIGHTupper outer quadrant. Patient tolerated well.

## 2018-10-16 ENCOUNTER — Other Ambulatory Visit: Payer: Self-pay | Admitting: Adult Health

## 2018-10-16 DIAGNOSIS — Z794 Long term (current) use of insulin: Secondary | ICD-10-CM

## 2018-10-16 DIAGNOSIS — E1122 Type 2 diabetes mellitus with diabetic chronic kidney disease: Secondary | ICD-10-CM

## 2018-10-16 DIAGNOSIS — N182 Chronic kidney disease, stage 2 (mild): Secondary | ICD-10-CM

## 2018-10-25 ENCOUNTER — Other Ambulatory Visit: Payer: Self-pay | Admitting: Internal Medicine

## 2018-10-25 DIAGNOSIS — R300 Dysuria: Secondary | ICD-10-CM

## 2018-11-01 ENCOUNTER — Other Ambulatory Visit: Payer: Self-pay | Admitting: Internal Medicine

## 2018-11-04 ENCOUNTER — Encounter: Payer: Self-pay | Admitting: Physician Assistant

## 2018-11-17 NOTE — Progress Notes (Signed)
FOLLOW UP  Assessment and Plan:   Hypertension Well controlled with current medications  Monitor blood pressure at home; patient to call if consistently greater than 130/80 Continue DASH diet.   Reminder to go to the ER if any CP, SOB, nausea, dizziness, severe HA, changes vision/speech, left arm numbness and tingling and jaw pain.  Cholesterol Currently at goal; atorvastatin 20 mg daily, zetia 10 mg daily  Continue low cholesterol diet and exercise.  Check lipid panel.   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  Perform daily foot/skin check, notify office of any concerning changes.  Check A1C  Obesity with co morbidities Long discussion about weight loss, diet, and exercise Recommended diet heavy in fruits and veggies and low in animal meats, cheeses, and dairy products, appropriate calorie intake Discussed ideal weight for height and initial weight goal (230 lb) Will follow up in 3 months  Vitamin D Def At goal at last visit; continue supplementation to maintain goal of 70-100 Defer Vit D level  Hypogonadism - continue to monitor, states medication is helping with symptoms of low T.  - Administered today  Continue diet and meds as discussed. Further disposition pending results of labs. Discussed med's effects and SE's.   Over 30 minutes of exam, counseling, chart review, and critical decision making was performed.   Future Appointments  Date Time Provider [place]  01/13/2019  9:00 AM Vicie Mutters, PA-C [id number] None    ----------------------------------------------------------------------------------------------------------------------  HPI 57 y.o. male  presents for 3 month follow up on hypertension, cholesterol, diabetes, weight, hypogonadism and [name].    BMI is Body mass index is 35.44 kg/m., he has been working on diet and exercise, cutting down on meat, eating more vegetables, 8-10 servings of veggies daily, meat only on the weekend. 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 He drinks water and diet tea, drinks 65+ fluid ounces daily.  Wt Readings from Last 3 Encounters:  11/19/18 247 lb (112 kg)  10/13/18 246 lb (111.6 kg)  08/12/18 246 lb (111.6 kg)   His blood pressure has been controlled at home, today their BP is BP: 116/82  He does workout. He denies chest pain, shortness of breath, dizziness.   He is on cholesterol medication (atorvastatin 20 mg daily, zetia 10 mg daily) and denies myalgias. His cholesterol is at goal. The cholesterol last visit was:   Lab Results  Component Value Date   CHOL 108 08/12/2018   HDL 28 (L) 08/12/2018   LDLCALC 67 08/12/2018   TRIG 58 08/12/2018   CHOLHDL 3.9 08/12/2018    He has been working on diet and exercise for T2 diabetes treated by metformin and novolin 70/30, recently started with continuous glucose monitoring device with significantly improved recorded home glucose by eating small, regular meals, forgot meter today but running similar to previous, and he has cut down on insulin intake, taking 7-8 units in AM/PM if 130+, reports he takes this rarely and denies foot ulcerations, hyperglycemia, increased appetite, nausea, paresthesia of the feet, polydipsia, polyuria, visual disturbances and vomiting. Last A1C in the office was:  Lab Results  Component Value Date   HGBA1C 7.4 (H) 08/12/2018   Patient is on Vitamin D supplement.   Lab Results  Component Value Date   VD25OH 74 01/07/2018  He has a history of testosterone deficiency and is on testosterone replacement, he hasn't had in a few months, due today.  He states that the testosterone helps with his energy, libido, muscle mass. Lab Results  Component Value Date   TESTOSTERONE 320 02/01/2016      Current Medications:  Current Outpatient Medications on File Prior to Visit  Medication Sig  . amLODipine (NORVASC) 10 MG tablet TAKE 1 TABLET BY MOUTH EVERY DAY  . atorvastatin (LIPITOR) 20 MG tablet TAKE 1 TABLET BY MOUTH EVERY DAY  . BD VEO INSULIN SYRINGE U/F 31G X 15/64" 0.5 ML MISC USE TWO DAILY WITH INSULIN  . bisoprolol (ZEBETA) 10 MG tablet TAKE 1 TABLET BY MOUTH EVERY DAY  . ciclesonide (OMNARIS) 50 MCG/ACT nasal spray Place 2 sprays into both nostrils daily as needed for allergies.   . clotrimazole-betamethasone (LOTRISONE) cream APPLY 3 TIMES A DAY AS NEEDED  . Continuous Blood Gluc Sensor (FREESTYLE LIBRE 14 DAY SENSOR) MISC USE AS DIRECTED  . Continuous Blood Gluc Sensor (FREESTYLE LIBRE SENSOR SYSTEM) MISC 1 device with sensors DX Z79.4 insulin dependent diabetes  . Continuous Blood Gluc Sensor MISC 1 each by Does not apply route as directed. Use as directed every 10 days. May dispense FreeStyle [employer] or similar.  . ezetimibe (ZETIA) 10 MG tablet TAKE 1 TABLET BY MOUTH EVERY DAY  . fluconazole (DIFLUCAN) 150 MG tablet Take 1 tablet daily for Yeast Infection  . glucose blood (FREESTYLE LITE) test strip Check blood sugar 3 times a day or as directed. [id number]  . ketoconazole (NIZORAL) 2 % shampoo Apply 1 application topically 2 (two) times a week. Apply  & shampoo 2 x /week  . metFORMIN (GLUCOPHAGE-XR) 500 MG 24 hr tablet TAKE 2 TABLETS BY MOUTH TWICE A DAY FOR DIABETES  . NOVOLIN 70/30 (70-30) 100 UNIT/ML injection INJECT UP TO 30 UNITS IN MORNING WITH FOOD & UP TO 30 UNITS WITH EVENING MEAL,ADJUST TO SUGARS  . predniSONE (DELTASONE) 20 MG tablet 2 tablets daily for 3 days, 1 tablet daily for 4 days.  . promethazine-dextromethorphan (PROMETHAZINE-DM) 6.25-15 MG/5ML syrup Take 5 mLs by mouth 4 (four) times daily as needed for cough.  . senna-docusate (SENOKOT-S) 8.6-50 MG tablet Take by mouth.  . tadalafil (CIALIS) 20 MG tablet Take 1/2 to 1 tablet every 2-3  days as needed (Patient taking differently: Take 10-20 mg by mouth daily as needed for erectile dysfunction. Take 1/2 to 1 tablet every 2-3 days as needed)  . telmisartan (MICARDIS) 40 MG tablet TAKE 1 TABLET BY MOUTH EVERY DAY  . testosterone cypionate (DEPOTESTOSTERONE CYPIONATE) 200 MG/ML injection INJECT 2 ML INTO THE MUSCLE EVERY 2 WEEKS  . Vitamin D, Ergocalciferol, (DRISDOL) 50000 units CAPS capsule TAKE ONE CAPSULE EVERY DAY OR AS DIRECTED   Current Facility-Administered Medications on File Prior to Visit  Medication  . 0.9 %  sodium chloride infusion  . testosterone cypionate (DEPOTESTOSTERONE CYPIONATE) injection 200 mg  . testosterone cypionate (DEPOTESTOSTERONE CYPIONATE) injection 200 mg     Allergies:  Allergies  Allergen Reactions  . 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 Allergic rhinitis   . Allergy   . Chronic sinusitis   . Diabetes mellitus ([place])   . Hyperlipidemia   . Hypertension   . Hypogonadism male   . 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  HENT: Negative for hearing loss and tinnitus.   Eyes: Negative for blurred vision and double vision.  Respiratory: Negative for cough, shortness of breath and wheezing.   Cardiovascular: Negative for chest pain, palpitations, orthopnea, claudication and leg swelling.  Gastrointestinal: Negative for abdominal pain, blood in stool, constipation, diarrhea, heartburn, melena, nausea and vomiting.  Genitourinary: Negative.   Musculoskeletal: Negative for joint pain and myalgias.  Skin: Negative for rash.  Neurological: Negative for dizziness, tingling, sensory change, weakness and headaches.   Endo/Heme/Allergies: Negative for polydipsia.  Psychiatric/Behavioral: Negative.   All other systems reviewed and are negative.    Physical Exam: BP 116/82   Pulse 76   Temp (!) 97.5 F (36.4 C)   Ht 5\' 10"  (1.778 m)   Wt 247 lb (112 kg)   SpO2 98%   BMI 35.44 kg/m  Wt Readings from Last 3 Encounters:  11/19/18 247 lb (112 kg)  10/13/18 246 lb (111.6 kg)  08/12/18 246 lb (111.6 kg)   General Appearance: Well nourished, in no apparent distress. Eyes: PERRLA, EOMs, conjunctiva no swelling or erythema Sinuses: No Frontal/maxillary tenderness ENT/Mouth: Ext aud canals clear, TMs without erythema, bulging. No erythema, swelling, or exudate on post pharynx.  Tonsils not swollen or erythematous. Hearing normal.  Neck: Supple, thyroid normal.  Respiratory: Respiratory effort normal, BS equal bilaterally without rales, rhonchi, wheezing or stridor.  Cardio: RRR with no MRGs. Brisk peripheral pulses without edema.  Abdomen: Soft, + BS.  Non tender, no guarding, rebound, hernias, masses. Lymphatics: Non tender without lymphadenopathy.  Musculoskeletal: Full ROM, 5/5 strength, Normal gait Skin: Warm, dry without rashes, lesions, ecchymosis.  Neuro: Cranial nerves intact. No cerebellar symptoms.  Psych: Awake and oriented X 3, normal affect, Insight and Judgment appropriate.    Izora Ribas, NP 9:00 AM [facility]

## 2018-11-19 ENCOUNTER — Encounter: Payer: Self-pay | Admitting: Adult Health

## 2018-11-19 ENCOUNTER — Ambulatory Visit: Payer: BLUE CROSS/BLUE SHIELD | Admitting: Adult Health

## 2018-11-19 VITALS — BP 116/82 | HR 76 | Temp 97.5°F | Ht 70.0 in | Wt 247.0 lb

## 2018-11-19 DIAGNOSIS — E1122 Type 2 diabetes mellitus with diabetic chronic kidney disease: Secondary | ICD-10-CM

## 2018-11-19 DIAGNOSIS — N521 Erectile dysfunction due to diseases classified elsewhere: Secondary | ICD-10-CM

## 2018-11-19 DIAGNOSIS — Z794 Long term (current) use of insulin: Secondary | ICD-10-CM

## 2018-11-19 DIAGNOSIS — E785 Hyperlipidemia, unspecified: Secondary | ICD-10-CM

## 2018-11-19 DIAGNOSIS — E559 Vitamin D deficiency, unspecified: Secondary | ICD-10-CM

## 2018-11-19 DIAGNOSIS — E119 Type 2 diabetes mellitus without complications: Secondary | ICD-10-CM

## 2018-11-19 DIAGNOSIS — E1159 Type 2 diabetes mellitus with other circulatory complications: Secondary | ICD-10-CM

## 2018-11-19 DIAGNOSIS — E1169 Type 2 diabetes mellitus with other specified complication: Secondary | ICD-10-CM | POA: Diagnosis not present

## 2018-11-19 DIAGNOSIS — N182 Chronic kidney disease, stage 2 (mild): Secondary | ICD-10-CM

## 2018-11-19 DIAGNOSIS — I10 Essential (primary) hypertension: Secondary | ICD-10-CM

## 2018-11-19 DIAGNOSIS — Z79899 Other long term (current) drug therapy: Secondary | ICD-10-CM | POA: Diagnosis not present

## 2018-11-19 DIAGNOSIS — E349 Endocrine disorder, unspecified: Secondary | ICD-10-CM

## 2018-11-19 MED ORDER — TESTOSTERONE CYPIONATE 200 MG/ML IM SOLN
200.0000 mg | Freq: Once | INTRAMUSCULAR | Status: AC
  Administered 2018-11-19: 200 mg via INTRAMUSCULAR

## 2018-11-19 NOTE — Patient Instructions (Signed)
Goals    . HEMOGLOBIN A1C < 8.0    . LDL CALC < 70    . Weight (lb) < 230 lb (104.3 kg)      8 Critical Weight-Loss Tips That Aren't Diet and Exercise  1. STARVE THE DISTRACTIONS  All too often when we eat, we're also multitasking: watching TV, answering emails, scrolling through social media. These habits are detrimental to having a strong, clear, healthy relationship with food, and they can hinder our ability to make dietary changes.  In order to truly focus on what you're eating, how much you're eating, why you're eating those specific foods and, most importantly, how those foods make you feel, you need to starve the distractions. That means when you eat, just eat. Focus on your food, the process it went through to end up on your plate, where it came from and how it nourishes you. With this technique, you're more likely to finish a meal feeling satiated.  2.  CONSIDER WHAT YOU'RE NOT WILLING TO DO  This might sound counterintuitive, but it can help provide a "why" when motivation is waning. Declare, in writing, what you are unwilling to do, for example "I am unwilling to be the old dad who cannot play sports with my children".  So consider what you're not willing to accept, write it down, and keep it at the ready.  3.  STOP LABELING FOOD "GOOD" AND "BAD"  You've probably heard someone say they ate something "bad." Maybe you've even said it yourself.  The trouble with 'bad' foods isn't that they'll send you to the grave after a bite or two. The trouble comes when we eat excessive portions of really calorie-dense foods meal after meal, day after day.  Instead of labeling foods as good or bad, think about which foods you can eat a lot of, and which ones you should just eat a little of. Then, plan ways to eat the foods you really like in portions that fit with your overall goals. A good example of this would be having a slice of pizza alongside a club salad with chicken breast, avocado and a  bit of dressing. This is vastly different than 3 slices of pizza, 4 breadsticks with cheese sauce and half of a liter of regular soda.  4.  BRUSH YOUR TEETH AFTER YOU EAT  Getting your mindset in order is important, but sometimes small habits can make a big difference. After eating, you still have the taste of food in their mouth, which often causes people to eat more even if they are full or engage in a nibble or two of dessert.  Brushing your teeth will remove the taste of food from your mouth, and the clean, minty freshness will serve as a cue that mealtime is over.  5.  FOCUS ON CROWDING NOT CUTTING  The most common first step during 'dieting' is to cut. We cut our portion sizes down, we cut out 'bad' foods, we cut out entire food groups. This act of cutting puts [place] and our minds into scarcity mode.  When something is off-limits, even if you're able to avoid it for a while, you could end up bingeing on it later because you've gone so long without it. So, instead of cutting, focus on crowding. If you crowd your plate and fill it up with more foods like veggies and protein, it simply allows less room for the other stuff. In other words, shift your focus away from what you can't  eat, and celebrate the foods that will help you reach your goals.  6.  TAKE TRACKING A STEP FURTHER  Track what you eat, when you ate it, how much you ate and how that food made you feel. Being completely honest with yourself and writing down every single thing that passes through your lips will help you start to notice that maybe you actually do snack, possibly take in more sugar than you thought, eat when you're bored rather than just hungry or maybe that you have a habit of snacking before bed while watching TV.  The difference from simply tracking your food intake is you're taking into account how food makes you feel, as well as what you're doing while you're eating. This is about becoming more mindful of what, when and  why you eat.  7.  PRIORITIZE GOOD SLEEP  One of the strongest risk factors for being overweight is poor sleep. When you're feeling tired, you're more likely to choose unhealthy comfort foods and to skip your workout. Additionally, sleep deprivation may slow down your metabolism. [employer]! Therefore, sleeping 7-8 hours per night can help with weight loss without having to change your diet or increase your physical activity. And if you feel you snore and still wake up tired, talk with me about sleep apnea.  8.  SET ASIDE TIME TO DISCONNECT  Just get out there. Disconnect from the electronics and connect to the elements. Not only will this help reduce stress (a major factor in weight gain) by giving your mind a break from the constant stimulation we've all become so accustomed to, but it may also reprogram your brain to connect with yourself and what you're feeling.

## 2018-11-20 LAB — COMPLETE METABOLIC PANEL WITH GFR
AG Ratio: 1.5 (calc) (ref 1.0–2.5)
ALT: 16 U/L (ref 9–46)
AST: 16 U/L (ref 10–35)
Albumin: 4.4 g/dL (ref 3.6–5.1)
Alkaline phosphatase (APISO): 64 U/L (ref 40–115)
BUN: 18 mg/dL (ref 7–25)
CO2: 25 mmol/L (ref 20–32)
Calcium: 10 mg/dL (ref 8.6–10.3)
Chloride: 103 mmol/L (ref 98–110)
Creat: 1.19 mg/dL (ref 0.70–1.33)
GFR, Est African American: 79 mL/min/{1.73_m2} (ref >=60)
GFR, Est Non African American: 68 mL/min/{1.73_m2} (ref >=60)
Globulin: 2.9 g/dL (calc) (ref 1.9–3.7)
Glucose, Bld: 120 mg/dL — ABNORMAL HIGH (ref 65–99)
Potassium: 4.6 mmol/L (ref 3.5–5.3)
Sodium: 139 mmol/L (ref 135–146)
Total Bilirubin: 0.5 mg/dL (ref 0.2–1.2)
Total Protein: 7.3 g/dL (ref 6.1–8.1)

## 2018-11-20 LAB — LIPID PANEL
Cholesterol: 133 mg/dL (ref <200)
HDL: 35 mg/dL — ABNORMAL LOW (ref >40)
LDL Cholesterol (Calc): 80 mg/dL (calc)
Non-HDL Cholesterol (Calc): 98 mg/dL (calc) (ref <130)
Total CHOL/HDL Ratio: 3.8 (calc) (ref <5.0)
Triglycerides: 98 mg/dL (ref <150)

## 2018-11-20 LAB — CBC WITH DIFFERENTIAL/PLATELET
Basophils Absolute: 39 cells/uL (ref 0–200)
Basophils Relative: 0.6 %
Eosinophils Absolute: 345 cells/uL (ref 15–500)
Eosinophils Relative: 5.3 %
HCT: 47.3 % (ref 38.5–50.0)
Hemoglobin: 15.6 g/dL (ref 13.2–17.1)
Lymphs Abs: 2392 cells/uL (ref 850–3900)
MCH: 27.6 pg (ref 27.0–33.0)
MCHC: 33 g/dL (ref 32.0–36.0)
MCV: 83.6 fL (ref 80.0–100.0)
MPV: 9.8 fL (ref 7.5–12.5)
Monocytes Relative: 8.4 %
Neutro Abs: 3179 cells/uL (ref 1500–7800)
Neutrophils Relative %: 48.9 %
Platelets: 340 10*3/uL (ref 140–400)
RBC: 5.66 10*6/uL (ref 4.20–5.80)
RDW: 13.8 % (ref 11.0–15.0)
Total Lymphocyte: 36.8 %
WBC mixed population: 546 cells/uL (ref 200–950)
WBC: 6.5 10*3/uL (ref 3.8–10.8)

## 2018-11-20 LAB — HEMOGLOBIN A1C
Hgb A1c MFr Bld: 7.1 % of total Hgb — ABNORMAL HIGH (ref <5.7)
Mean Plasma Glucose: 157 (calc)
eAG (mmol/L): 8.7 (calc)

## 2018-11-20 LAB — TSH: TSH: 1.21 mIU/L (ref 0.40–4.50)

## 2018-11-20 LAB — MAGNESIUM: Magnesium: 2 mg/dL (ref 1.5–2.5)

## 2018-11-20 LAB — TESTOSTERONE: Testosterone: 95 ng/dL — ABNORMAL LOW (ref 250–827)

## 2018-12-04 ENCOUNTER — Ambulatory Visit: Payer: BLUE CROSS/BLUE SHIELD | Admitting: *Deleted

## 2018-12-04 VITALS — BP 134/82 | HR 88 | Temp 97.6°F | Resp 16 | Ht 70.0 in | Wt 248.2 lb

## 2018-12-04 DIAGNOSIS — E291 Testicular hypofunction: Secondary | ICD-10-CM

## 2018-12-04 MED ORDER — TESTOSTERONE CYPIONATE 200 MG/ML IM SOLN
400.0000 mg | Freq: Once | INTRAMUSCULAR | Status: AC
  Administered 2018-12-04: 400 mg via INTRAMUSCULAR

## 2018-12-04 NOTE — Patient Instructions (Signed)
Patient is here for a NV for his Testosterone Cypionate 200 mg/ml IM right upper outer quadrant. Patient tolerated well and will return in 2 weeks for next injection.

## 2018-12-18 ENCOUNTER — Ambulatory Visit: Payer: BLUE CROSS/BLUE SHIELD

## 2018-12-18 DIAGNOSIS — E349 Endocrine disorder, unspecified: Secondary | ICD-10-CM | POA: Diagnosis not present

## 2018-12-18 MED ORDER — TESTOSTERONE CYPIONATE 200 MG/ML IM SOLN: INTRAMUSCULAR | 3 refills | Status: DC

## 2018-12-18 MED ORDER — TESTOSTERONE CYPIONATE 200 MG/ML IM SOLN
200.0000 mg | Freq: Once | INTRAMUSCULAR | Status: AC
  Administered 2018-12-18: 200 mg via INTRAMUSCULAR

## 2018-12-18 NOTE — Progress Notes (Signed)
Patient is here for a NV for his Testosterone Cypionate 200 mg/ml IM left upper outer quadrant. Patient tolerated well and will return in 2 weeks for next injection.

## 2018-12-19 ENCOUNTER — Other Ambulatory Visit: Payer: Self-pay | Admitting: Adult Health

## 2019-01-08 ENCOUNTER — Ambulatory Visit: Payer: Self-pay

## 2019-01-13 ENCOUNTER — Encounter: Payer: Self-pay | Admitting: Physician Assistant

## 2019-01-20 ENCOUNTER — Other Ambulatory Visit: Payer: BLUE CROSS/BLUE SHIELD

## 2019-01-20 DIAGNOSIS — E349 Endocrine disorder, unspecified: Secondary | ICD-10-CM

## 2019-01-20 DIAGNOSIS — E291 Testicular hypofunction: Secondary | ICD-10-CM | POA: Diagnosis not present

## 2019-01-21 LAB — TESTOSTERONE: Testosterone: 62 ng/dL — ABNORMAL LOW (ref 250–827)

## 2019-01-26 ENCOUNTER — Other Ambulatory Visit: Payer: Self-pay

## 2019-01-26 DIAGNOSIS — E349 Endocrine disorder, unspecified: Secondary | ICD-10-CM

## 2019-01-26 MED ORDER — TESTOSTERONE CYPIONATE 200 MG/ML IM SOLN: INTRAMUSCULAR | 3 refills | Status: DC

## 2019-01-26 NOTE — Telephone Encounter (Signed)
Refill request for Testosterone. PA completed and approved. In que for your review

## 2019-02-13 ENCOUNTER — Ambulatory Visit: Payer: BLUE CROSS/BLUE SHIELD

## 2019-02-13 VITALS — BP 120/72 | HR 77 | Temp 97.9°F | Ht 70.0 in | Wt 250.0 lb

## 2019-02-13 DIAGNOSIS — E349 Endocrine disorder, unspecified: Secondary | ICD-10-CM | POA: Diagnosis not present

## 2019-02-13 MED ORDER — TESTOSTERONE CYPIONATE 200 MG/ML IM SOLN
200.0000 mg | Freq: Once | INTRAMUSCULAR | Status: AC
  Administered 2019-02-13: 200 mg via INTRAMUSCULAR

## 2019-02-13 NOTE — Progress Notes (Signed)
Patient is here for a NV for his Testosterone Cypionate 200 mg/ml IM RIGHTupper outer quadrant. Patient tolerated well and will return in 2 weeks for next injection 

## 2019-02-26 ENCOUNTER — Other Ambulatory Visit: Payer: Self-pay

## 2019-02-26 ENCOUNTER — Ambulatory Visit: Payer: BLUE CROSS/BLUE SHIELD

## 2019-02-26 VITALS — BP 130/80 | HR 85 | Temp 97.9°F | Ht 70.0 in | Wt 257.0 lb

## 2019-02-26 DIAGNOSIS — E291 Testicular hypofunction: Secondary | ICD-10-CM

## 2019-02-26 MED ORDER — TESTOSTERONE CYPIONATE 200 MG/ML IM SOLN
200.0000 mg | Freq: Once | INTRAMUSCULAR | Status: AC
  Administered 2019-02-26: 200 mg via INTRAMUSCULAR

## 2019-02-26 NOTE — Progress Notes (Signed)
Patient is here for a NV for his Testosterone Cypionate 200 mg/ml IMLEFTupper outer quadrant. Patient tolerated well and will return in 2 weeks for next injection 

## 2019-02-27 ENCOUNTER — Ambulatory Visit: Payer: Self-pay

## 2019-03-13 ENCOUNTER — Other Ambulatory Visit: Payer: Self-pay

## 2019-03-13 ENCOUNTER — Ambulatory Visit: Payer: BLUE CROSS/BLUE SHIELD

## 2019-03-13 VITALS — BP 110/70 | HR 73 | Temp 98.2°F | Ht 70.0 in | Wt 253.0 lb

## 2019-03-13 DIAGNOSIS — E291 Testicular hypofunction: Secondary | ICD-10-CM | POA: Diagnosis not present

## 2019-03-13 MED ORDER — TESTOSTERONE CYPIONATE 200 MG/ML IM SOLN
200.0000 mg | Freq: Once | INTRAMUSCULAR | Status: AC
  Administered 2019-03-13: 200 mg via INTRAMUSCULAR

## 2019-03-13 NOTE — Progress Notes (Signed)
Patient is here for a NV for his Testosterone Cypionate 200 mg/ml IM RIGHTupper outer quadrant. Patient tolerated well and will return in 2 weeks for next injection

## 2019-03-19 NOTE — Progress Notes (Signed)
Complete Physical  Assessment and Plan:  Essential hypertension - continue medications, DASH diet, exercise and monitor at home. Call if greater than 130/80.  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  Hyperlipidemia associated with type 2 [name] -     Lipid panel check lipids decrease fatty foods increase activity.   Controlled type 2 diabetes mellitus with stage 3 chronic kidney disease, with long-term current use of insulin ([place]) Discussed general issues about diabetes pathophysiology and management., Educational material distributed., Suggested low cholesterol diet., Encouraged aerobic exercise., Discussed foot care., Reminded to get yearly retinal exam.  Insulin-requiring or dependent type II diabetes mellitus ([place]) -     Hemoglobin A1c Patient admits to not eating well, will try to be stricter.   CKD stage 2 due to type 2 diabetes mellitus (HCC) -     Hemoglobin A1c Increase fluids, avoid NSAIDS, monitor sugars, will monitor  Morbid obesity (HCC) - follow up 3 months for progress monitoring - increase veggies, decrease carbs - long discussion about weight loss, diet, and exercise  Poor compliance with diet LONG DISCUSSION THAT HAVING HIS SUGARS UNCONTROLLED DECREASES IMMUNE SYSTEM  Medication management -     Magnesium  [name] -     VITAMIN D 25 Hydroxy (Vit-D Deficiency, Fractures) -     Vitamin D, Ergocalciferol, (DRISDOL) 1.25 MG (50000 UT) CAPS capsule;  TAKE ONE CAPSULE EVERY DAY OR AS DIRECTED  Testosterone deficiency -     Testosterone -     testosterone cypionate (DEPOTESTOSTERONE CYPIONATE) injection 200 mg  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   Encounter for general adult medical examination with abnormal findings 1 YEAR  Screening, anemia, deficiency, iron -     Iron,Total/Total Iron Binding Cap -     Ferritin -     Vitamin B12  Screening PSA (prostate specific antigen) -     PSA   Discussed med's effects and SE's. Screening labs and tests as requested with regular follow-up as recommended. Over 40 minutes of exam, counseling, chart review and critical decision making was performed  HPI Patient presents for a complete physical.   Father in [place] had left nephrectomy due to renal cancer, he was there to care give for him and has gotten out of his routine. Dad is doing better, he is 66.   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   His blood pressure has been controlled at home, today their BP is BP: 132/76 He does not workout but tried to get 5000 steps a day.  He denies chest pain, shortness of breath, dizziness.  He is on cholesterol medication, zetia 10 mg and lipitor 20 and denies myalgias. His cholesterol is at goal. The cholesterol last visit was:   Lab Results  Component Value Date   CHOL 133 11/19/2018   HDL 35 (L) 11/19/2018   [place]  80 11/19/2018   TRIG 98 11/19/2018   CHOLHDL 3.8 11/19/2018   He has been working on diet and exercise for Diabetes with diabetic chronic kidney disease, he is on bASA every other day due to history of retroperitoneal hematoma, he is on ACE/ARB, he is on 70/30 insulin he is on 9-10 units and night time 9-10, he is on metformin 4 a day, he has continuous glucose monitor which is helping, he is not getting lunch due to working through it so it has  made things harder with taking the metformin, no low sugars, states low is 130 and high is 150 and denies paresthesia of the feet, polydipsia, polyuria and visual disturbances. Last A1C was:  Lab Results  Component Value Date   HGBA1C 7.1 (H) 11/19/2018   Last GFR: Lab Results  Component Value Date   GFRAA 79 11/19/2018   Patient is on Vitamin D supplement.   Lab Results  Component Value Date   VD25OH 74 01/07/2018     Last PSA was: Lab Results  Component Value Date   PSA 1.7 01/07/2018   BMI is Body mass index is 36.45 kg/m., he is working on diet and exercise. Wt Readings from Last 3 Encounters:  03/23/19 254 lb (115.2 kg)  03/13/19 253 lb (114.8 kg)  02/26/19 257 lb (116.6 kg)   He has a history of testosterone deficiency and is on testosterone replacement. He states that the testosterone helps with his energy, libido, muscle mass. GOT injection today Lab Results  Component Value Date   TESTOSTERONE 62 (L) 01/20/2019     Current Medications:  Current Outpatient Medications on File Prior to Visit  Medication Sig  . amLODipine (NORVASC) 10 MG tablet TAKE 1 TABLET BY MOUTH EVERY DAY  . atorvastatin (LIPITOR) 20 MG tablet TAKE 1 TABLET BY MOUTH EVERY DAY  . BD VEO INSULIN SYRINGE U/F 31G X 15/64" 0.5 ML MISC USE TWO DAILY WITH INSULIN  . bisoprolol (ZEBETA) 10 MG tablet TAKE 1 TABLET BY MOUTH EVERY DAY  . ciclesonide (OMNARIS) 50 MCG/ACT nasal spray Place 2 sprays into both nostrils daily as needed for allergies.   . clotrimazole-betamethasone (LOTRISONE) cream APPLY 3 TIMES A DAY AS NEEDED  . Continuous Blood Gluc Sensor (FREESTYLE LIBRE 14 DAY SENSOR) MISC USE AS DIRECTED  . Continuous Blood Gluc Sensor (FREESTYLE LIBRE SENSOR SYSTEM) MISC 1 device with sensors DX Z79.4 insulin dependent diabetes  . Continuous Blood Gluc Sensor MISC 1 each by Does not apply route as directed. Use as directed every 10 days. May dispense FreeStyle [employer] or similar.  .  ezetimibe (ZETIA) 10 MG tablet TAKE 1 TABLET BY MOUTH EVERY DAY  . fluconazole (DIFLUCAN) 150 MG tablet Take 1 tablet daily for Yeast Infection  . glucose blood (FREESTYLE LITE) test strip Check blood sugar 3 times a day or as directed. DX-E11.22  . ketoconazole (NIZORAL) 2 % shampoo Apply 1 application topically 2 (two) times a week. Apply  & shampoo 2 x /week  . metFORMIN (GLUCOPHAGE-XR) 500 MG 24 hr tablet TAKE 2 TABLETS BY MOUTH TWICE A DAY FOR DIABETES  . NOVOLIN 70/30 (70-30) 100 UNIT/ML injection INJECT UP TO 30 UNITS IN MORNING WITH FOOD & UP TO 30 UNITS WITH EVENING MEAL,ADJUST TO SUGARS  . senna-docusate (SENOKOT-S) 8.6-50 MG tablet Take by mouth.  . tadalafil (CIALIS) 20 MG tablet Take 1/2 to 1 tablet every 2-3 days as needed (Patient taking differently: Take 10-20 mg by mouth daily as needed  for erectile dysfunction. Take 1/2 to 1 tablet every 2-3 days as needed)  . telmisartan (MICARDIS) 40 MG tablet TAKE 1 TABLET BY MOUTH EVERY DAY  . testosterone cypionate (DEPOTESTOSTERONE CYPIONATE) 200 MG/ML injection INJECT 2 ML INTO THE MUSCLE EVERY 2 WEEKS   Current Facility-Administered Medications on File Prior to Visit  Medication  . 0.9 %  sodium chloride infusion   Allergies:  Allergies  Allergen Reactions  . Ppd [Tuberculin Purified Protein Derivative] Other (See Comments)    POSITIVE REACTION IN PAST 11/21/11 CXR NEG   Health Maintenance:  Immunization History  Administered Date(s) Administered  . Influenza Split 10/10/2013  . Influenza Whole 09/08/2012  . Influenza-Unspecified 09/22/2018  . Pneumococcal-Unspecified 12/31/2011  . Tdap 12/19/2009    Tetanus:2011 Pneumovax: 2003 Prevnar 13: Flu vaccine: 2019 Zostavax:  DEXA: Colonoscopy: Apr 28 2018 EGD: Eye Exam: [place] 2017 Dentist:  DM eye exam 06/2018  Patient Care Team: Unk Pinto, MD as PCP - General (Internal Medicine)  Medical History:  has Nonallergic rhinitis; Obstructive sleep  apnea-questioned; Hypertension; Hyperlipidemia associated with type 2 diabetes mellitus ([place]); Type 2 diabetes mellitus, controlled, with renal complications ([place]); [name]; Testosterone deficiency; Morbid obesity ([place]); Poor compliance with diet; Medication management; 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   Surgical History:  He  has a past surgical history that includes Wisdom tooth extraction; Hernia repair; Colonoscopy; and Rotator cuff repair (Left, 2017 sept 5).   Family History:  His family history includes Cancer (age of onset: 63) in his father; Diabetes in his brother, father, and mother; Hyperlipidemia in his father and mother; Hypertension in his brother, father, and mother.   Social History:   reports that he quit smoking about 18 years ago. His smoking use included cigarettes. He has a 2.40 pack-year smoking history. He has never used smokeless tobacco. He reports current alcohol use. He reports that he does not use drugs.   Review of Systems:  Review of Systems  Constitutional: Negative.   HENT: Negative.   Eyes: Negative.   Respiratory: Negative.   Cardiovascular: Negative.   Gastrointestinal: Negative.   Genitourinary: Negative.   Musculoskeletal: Negative.   Skin: Negative.   Neurological: Negative.   Endo/Heme/Allergies: Negative.   Psychiatric/Behavioral: Negative.     Physical Exam: Estimated body mass index is 36.45 kg/m as calculated from the following:   Height as of this encounter: 5\' 10"  (1.778 m).   Weight as of this encounter: 254 lb (115.2 kg). BP 132/76   Pulse 84   Temp 98 F (36.7 C)   Ht 5\' 10"  (1.778 m)   Wt 254 lb (115.2 kg)   SpO2 94%   BMI 36.45 kg/m  General Appearance: Well nourished, in no apparent distress.  Eyes: PERRLA, EOMs, conjunctiva  no swelling or erythema, normal fundi and vessels.  Sinuses: No Frontal/maxillary tenderness  ENT/Mouth: Ext aud canals clear, normal light reflex with TMs without erythema, bulging. Good dentition. No erythema, swelling, or exudate on post pharynx. Tonsils not swollen or erythematous. Hearing normal.  Neck: Supple, thyroid normal. No bruits  Respiratory: Respiratory effort normal, BS equal bilaterally without rales, rhonchi, wheezing or stridor.  Cardio: RRR without murmurs, prominent S2, no rubs or gallops. Brisk peripheral pulses without edema.  Chest: symmetric, with normal excursions and percussion.  Abdomen: Soft, nontender, no guarding, rebound, hernias, masses, or  organomegaly.  Lymphatics: Non tender without lymphadenopathy.  Genitourinary: defer Musculoskeletal: Full ROM all peripheral extremities,5/5 strength, and normal gait.  Skin: Warm, dry without rashes, lesions, ecchymosis. Neuro: Cranial nerves intact, reflexes equal bilaterally. Normal muscle tone, no cerebellar symptoms. Sensation intact.  Psych: Awake and oriented X 3, normal affect, Insight and Judgment appropriate.   EKG: WNL, concave ST changes, not different from last year.  AORTA SCAN: N/A  Vicie Mutters 10:32 AM [facility]

## 2019-03-23 ENCOUNTER — Encounter: Payer: Self-pay | Admitting: Physician Assistant

## 2019-03-23 ENCOUNTER — Other Ambulatory Visit: Payer: Self-pay

## 2019-03-23 ENCOUNTER — Ambulatory Visit: Payer: BLUE CROSS/BLUE SHIELD | Admitting: Physician Assistant

## 2019-03-23 ENCOUNTER — Telehealth: Payer: Self-pay | Admitting: Physician Assistant

## 2019-03-23 VITALS — BP 132/76 | HR 84 | Temp 98.0°F | Ht 70.0 in | Wt 254.0 lb

## 2019-03-23 DIAGNOSIS — Z125 Encounter for screening for malignant neoplasm of prostate: Secondary | ICD-10-CM | POA: Diagnosis not present

## 2019-03-23 DIAGNOSIS — Z1329 Encounter for screening for other suspected endocrine disorder: Secondary | ICD-10-CM | POA: Diagnosis not present

## 2019-03-23 DIAGNOSIS — Z136 Encounter for screening for cardiovascular disorders: Secondary | ICD-10-CM | POA: Diagnosis not present

## 2019-03-23 DIAGNOSIS — Z1389 Encounter for screening for other disorder: Secondary | ICD-10-CM

## 2019-03-23 DIAGNOSIS — Z130 Encounter for screening for diseases of the blood and blood-forming organs and certain disorders involving the immune mechanism: Secondary | ICD-10-CM

## 2019-03-23 DIAGNOSIS — Z131 Encounter for screening for diabetes mellitus: Secondary | ICD-10-CM

## 2019-03-23 DIAGNOSIS — E1159 Type 2 diabetes mellitus with other circulatory complications: Secondary | ICD-10-CM

## 2019-03-23 DIAGNOSIS — N183 Chronic kidney disease, stage 3 unspecified: Secondary | ICD-10-CM

## 2019-03-23 DIAGNOSIS — I10 Essential (primary) hypertension: Secondary | ICD-10-CM

## 2019-03-23 DIAGNOSIS — R350 Frequency of micturition: Secondary | ICD-10-CM | POA: Diagnosis not present

## 2019-03-23 DIAGNOSIS — Z79899 Other long term (current) drug therapy: Secondary | ICD-10-CM | POA: Diagnosis not present

## 2019-03-23 DIAGNOSIS — E119 Type 2 diabetes mellitus without complications: Secondary | ICD-10-CM

## 2019-03-23 DIAGNOSIS — Z0000 Encounter for general adult medical examination without abnormal findings: Secondary | ICD-10-CM | POA: Diagnosis not present

## 2019-03-23 DIAGNOSIS — Z91199 Patient's noncompliance with other medical treatment and regimen due to unspecified reason: Secondary | ICD-10-CM

## 2019-03-23 DIAGNOSIS — G4733 Obstructive sleep apnea (adult) (pediatric): Secondary | ICD-10-CM

## 2019-03-23 DIAGNOSIS — E559 Vitamin D deficiency, unspecified: Secondary | ICD-10-CM

## 2019-03-23 DIAGNOSIS — N401 Enlarged prostate with lower urinary tract symptoms: Secondary | ICD-10-CM

## 2019-03-23 DIAGNOSIS — Z0001 Encounter for general adult medical examination with abnormal findings: Secondary | ICD-10-CM

## 2019-03-23 DIAGNOSIS — E1169 Type 2 diabetes mellitus with other specified complication: Secondary | ICD-10-CM

## 2019-03-23 DIAGNOSIS — Z9119 Patient's noncompliance with other medical treatment and regimen: Secondary | ICD-10-CM

## 2019-03-23 DIAGNOSIS — E1122 Type 2 diabetes mellitus with diabetic chronic kidney disease: Secondary | ICD-10-CM

## 2019-03-23 DIAGNOSIS — Z13220 Encounter for screening for lipoid disorders: Secondary | ICD-10-CM

## 2019-03-23 DIAGNOSIS — Z794 Long term (current) use of insulin: Secondary | ICD-10-CM

## 2019-03-23 DIAGNOSIS — J310 Chronic rhinitis: Secondary | ICD-10-CM

## 2019-03-23 DIAGNOSIS — N182 Chronic kidney disease, stage 2 (mild): Secondary | ICD-10-CM

## 2019-03-23 DIAGNOSIS — E785 Hyperlipidemia, unspecified: Secondary | ICD-10-CM

## 2019-03-23 DIAGNOSIS — E349 Endocrine disorder, unspecified: Secondary | ICD-10-CM

## 2019-03-23 DIAGNOSIS — N521 Erectile dysfunction due to diseases classified elsewhere: Secondary | ICD-10-CM

## 2019-03-23 MED ORDER — VITAMIN D (ERGOCALCIFEROL) 1.25 MG (50000 UNIT) PO CAPS: ORAL_CAPSULE | ORAL | 1 refills | Status: DC

## 2019-03-23 MED ORDER — TESTOSTERONE CYPIONATE 200 MG/ML IM SOLN
200.0000 mg | INTRAMUSCULAR | Status: DC
  Administered 2019-03-23: 200 mg via INTRAMUSCULAR

## 2019-03-23 NOTE — Patient Instructions (Addendum)
When it comes to diets, agreement about the perfect plan isn't easy to find, even among the experts. Experts at the [place] developed an idea known as the Healthy Eating Plate. Just imagine a plate divided into logical, healthy portions.  The emphasis is on diet quality:  Load up on vegetables and fruits - one-half of your plate: Aim for color and variety, and remember that potatoes don't count.  Go for whole grains - one-quarter of your plate: Whole wheat, barley, wheat berries, quinoa, oats, brown rice, and foods made with them. If you want pasta, go with whole wheat pasta.  Protein power - one-quarter of your plate: Fish, chicken, beans, and nuts are all healthy, versatile protein sources. Limit red meat.  The diet, however, does go beyond the plate, offering a few other suggestions.  Use healthy plant oils, such as olive, canola, soy, corn, sunflower and peanut. Check the labels, and avoid partially hydrogenated oil, which have unhealthy trans fats.  If you're thirsty, drink water. Coffee and tea are good in moderation, but skip sugary drinks and limit milk and dairy products to one or two daily servings.  The type of carbohydrate in the diet is more important than the amount. Some sources of carbohydrates, such as vegetables, fruits, whole grains, and beans-are healthier than others.  Finally, stay active.   Diabetes or even increased sugars put you at 300% increased risk of heart attack and stroke.  ALSO BEING DIABETIC YOU MAY NOT HAVE ANY PAIN WITH A HEART ATTACK.  Even worse of a chance of no pain if you are a woman.  It is very unlikely that you will have any pain with a heart attack. Likely your symptoms will be very subtle, even for very severe disease.  Your symptoms for a heart attack will likely occur when you exert your self or exercise and include: Shortness of breath Sweating Nausea Dizziness Fast or irregular heart beats Fatigue   It  makes me feel better if my diabetics get their heart rate up with exercise once or twice a week and pay close attention to your body. If there is ANY change in your exercise capacity or if you have symptoms above, please STOP and call 911 or call to come to the office.   PLEASE REMEMBER:  Diabetes is preventable! Up to 53 percent of complications and morbidities among individuals with type 2 diabetes can be prevented, delayed, or effectively treated and minimized with regular visits to a health professional, appropriate monitoring and medication, and a healthy diet and lifestyle.   Here is some information to help you keep your heart healthy: Move it! - Aim for 30 mins of activity every day. Take it slowly at first. Talk to [place] before starting any new exercise program.   Lose it.  -Body Mass Index (BMI) can indicate if you need to lose weight. A healthy range is 18.5-24.9. For a BMI calculator, go to [email or web address]  Waist Management -Excess abdominal fat is a risk factor for heart disease, diabetes, asthma, stroke and more. Ideal waist circumference is less than 35" for women and less than 40" for men.   Eat Right -focus on fruits, vegetables, whole grains, and meals you make yourself. Avoid foods with trans fat and high sugar/sodium content.   Snooze or Snore? - Loud snoring can be a sign of sleep apnea, a significant risk factor for high blood pressure, heart attach, stroke, and heart arrhythmias.  Kick the habit -  Quit Smoking! Avoid second hand smoke. A single cigarette raises your blood pressure for 20 mins and increases the risk of heart attack and stroke for the next 24 hours.   Are Aspirin and Supplements right for you? -Add ENTERIC COATED low dose 81 mg Aspirin daily OR can do every other day if you have easy bruising to protect your heart and head. As well as to reduce risk of Colon Cancer by 20 %, Skin Cancer by 26 % , Melanoma by 46% and Pancreatic cancer by 60%  Say "No to  Stress -There may be little you can do about problems that cause stress. However, techniques such as long walks, meditation, and exercise can help you manage it.   Start Now! - Make changes one at a time and set reasonable goals to increase your likelihood of success.      AN UPDATE FROM [name] Remember information is changing on an hourly basis, we are doing our best to stay up to date on the information.  If you have a question or concern please contact our office.   Daily Quarantine Questions to lift the spirit and help the body... - Who am I checking on or connecting with today? - What expectations of "normal" am I letting go of today? - How am I getting outside today? - How am I expressing my creativity today? - How am I moving my body today? - What type of self-care am I practicing today? - What am I grateful for today?  What are the symptoms? There are many different symptoms reported such as diarrhea, loss of smell, muscle aches HOWEVER the most common associated with the virus at this time is FEVER, DRY COUGH, and SHORTNESS OF BREATH.   What is shortness of breath that is concerning? - If you are unable to talk in full sentences - if you are unable to take a full breath (not to deep) and hold for 20-25 seconds.  - If you are panting while walking.  Some shortness of breath is common with this virus. Please do not panic. The best thing to do with any symptoms is to call the office. We can see you on video or talk with you on the phone to best determine if you need to go to the ER or get tested.   What to do if you have symptoms or you feel that you have come in contact with someone that may be infected with coronavirus? Please stay at home.  Call our office or send a MyChart message.  Please remember the majority of cases are self-limited and do not require in hospital intervention.  There is no specific treatment for a mild case of  the virus other than supportive care.  Please only go to the ER if your symptoms escalate such as worsening shortness of breath, chest pain, and confusion.   As we mentioned before we are continually getting new information and there is data showing that even if you do not have symptoms you may be able to spread the virus. Therefore, the best thing to do is to stay home! Please limit your contact with people, be mindful of social distancing.   At this time we are STILL NOT accepting walk in appointments or sick visits.  We are still having asymptomatic patients come into the office however we are asking that you call the office from your car to notify that you are here and we can check you  into your appointment via the telephone.  We will have a nurse call you when she is ready for you to come into the office, this way we will not have patients waiting in the waiting room and can be more efficient.   Virtual visits and Evisits are in full swing and so far the patients are really enjoying them For your convenience and to prevent transmission, we are now performing Evisits or telephone encounters for virtual visits for sick visits and regular scheduled follow up visits.  You can contact one of your providers through [facility], your patient portal at any time or call the office during business hours.   Medicare and insurances are covering this during this crisis.  This helps you because we are able to talk about your conditions, discuss things to monitor and do; in addition, it also helps [place] as a practice. We are a small private practice and need our patients help to remain open by doing these visits.  So please consider doing a virtual visit rather than rescheduling completely!  PLEASE DO NOT CLICK THE EVISIT BUTTON ON MY CHART, INSTEAD CHOOSE TO MESSAGE YOUR PROVIDER AND SEND [place] A MESSAGE WITH YOUR ISSUES.   As a community we are no longer testing patients with mild symptoms.  If the symptoms are  mild, we are NOT testing patients to conserve supplies and capacity so our health care workers can care for people who need medical attention even during the peak of the outbreak.  Patients with mild symptoms are being instructed to stay at home and recover for 2 weeks.  You can stay in contact with [place] during that time by mychart, telephone calls or we are starting virtual office visits with video capability.   Mild symptoms include cough,fever WITHOUT any of the following symptoms: Difficulty breathing, chest discomfort, altered thinking and confusion.  Please notify us immediately if you have these symptoms.   We have also decided NOT to do testing in our office for coronavirus at this time though we are continually accessing the situation as needed and will notify you if this changes.    If after a mychart communication or telephone visit, we feel you need testing then we will put in an order for you to get tested at a specimen collection site through [facility].   We will not test you if you do not have symptoms or have not had potential contact at this time.  We will not test you if you just show up to the office, you need an appointment specifically for testing, please call or message first.   Please remember that this virus is happening at the same time as other respiratory viruses, such as influenza, common colds and now allergy season.   To reduce your risk of infection we recommend the same precautions as those used to avoid the common cold and flu virus  . Please wash your hands with soap and water for 20 seconds and frequently. Wendee Copp your hands before you eat or touch your face.  . Avoid touching your face, eyes, nose, and mouth as much as possible.  . if needing to cough or sneeze cover your mouth and nose by coughing or sneezing into your elbow area, your sleeve or a tissue . Routinely clean frequently touched items such as doorknobs, keyboards, and phones.  . Avoid  crowds of people.  Marland Kitchen avoid shaking hands with others; . We recommend anyone within the high-risk demographics such as older  adults and those with heart/lung disease, auto-immune or immune-suppressing health conditions to consider reduced travel and public outings.  Please refer to the sources below for current updates, guidelines and recommendations.  You can call this hotline set up by [facility] hospital 1 [phone])  You can visit these websites: CDC.gov WHO.int

## 2019-03-23 NOTE — Progress Notes (Signed)
Patient is here for a NV for his Testosterone Cypionate 200 mg/ml IM LEFTupper outer quadrant. Patient tolerated well and will return in 2 weeks for next injection

## 2019-03-23 NOTE — Telephone Encounter (Signed)
-----   Message from Elenor Quinones, [place] sent at 03/23/2019 12:20 PM EDT ----- Regarding: PHARMACY REQUEST Contact: [phone] PER PHARMACY     Direction for Vitamin D 1.25 mg capsule are usually taken once per week, not once per day.  Please resend Rx if sent in error   [place] [address]

## 2019-03-24 LAB — CBC WITH DIFFERENTIAL/PLATELET
Absolute Monocytes: 756 cells/uL (ref 200–950)
Basophils Absolute: 42 cells/uL (ref 0–200)
Basophils Relative: 0.7 %
Eosinophils Absolute: 180 cells/uL (ref 15–500)
Eosinophils Relative: 3 %
HCT: 52.3 % — ABNORMAL HIGH (ref 38.5–50.0)
Hemoglobin: 17.1 g/dL (ref 13.2–17.1)
Lymphs Abs: 1848 cells/uL (ref 850–3900)
MCH: 27.4 pg (ref 27.0–33.0)
MCHC: 32.7 g/dL (ref 32.0–36.0)
MCV: 83.8 fL (ref 80.0–100.0)
MPV: 9.9 fL (ref 7.5–12.5)
Monocytes Relative: 12.6 %
Neutro Abs: 3174 cells/uL (ref 1500–7800)
Neutrophils Relative %: 52.9 %
Platelets: 310 10*3/uL (ref 140–400)
RBC: 6.24 10*6/uL — ABNORMAL HIGH (ref 4.20–5.80)
RDW: 15 % (ref 11.0–15.0)
Total Lymphocyte: 30.8 %
WBC: 6 10*3/uL (ref 3.8–10.8)

## 2019-03-24 LAB — COMPLETE METABOLIC PANEL WITH GFR
AG Ratio: 1.8 (calc) (ref 1.0–2.5)
ALT: 16 U/L (ref 9–46)
AST: 12 U/L (ref 10–35)
Albumin: 4.6 g/dL (ref 3.6–5.1)
Alkaline phosphatase (APISO): 82 U/L (ref 35–144)
BUN: 18 mg/dL (ref 7–25)
CO2: 28 mmol/L (ref 20–32)
Calcium: 9.8 mg/dL (ref 8.6–10.3)
Chloride: 97 mmol/L — ABNORMAL LOW (ref 98–110)
Creat: 1.23 mg/dL (ref 0.70–1.33)
GFR, Est African American: 76 mL/min/{1.73_m2} (ref >=60)
GFR, Est Non African American: 65 mL/min/{1.73_m2} (ref >=60)
Globulin: 2.6 g/dL (calc) (ref 1.9–3.7)
Glucose, Bld: 395 mg/dL — ABNORMAL HIGH (ref 65–99)
Potassium: 4.6 mmol/L (ref 3.5–5.3)
Sodium: 132 mmol/L — ABNORMAL LOW (ref 135–146)
Total Bilirubin: 0.7 mg/dL (ref 0.2–1.2)
Total Protein: 7.2 g/dL (ref 6.1–8.1)

## 2019-03-24 LAB — HEMOGLOBIN A1C
Hgb A1c MFr Bld: 10.8 % of total Hgb — ABNORMAL HIGH (ref <5.7)
Mean Plasma Glucose: 263 (calc)
eAG (mmol/L): 14.6 (calc)

## 2019-03-24 LAB — URINALYSIS, ROUTINE W REFLEX MICROSCOPIC
Bilirubin Urine: NEGATIVE
Hgb urine dipstick: NEGATIVE
Ketones, ur: NEGATIVE
Leukocytes,Ua: NEGATIVE
Nitrite: NEGATIVE
Protein, ur: NEGATIVE
Specific Gravity, Urine: 1.033 (ref 1.001–1.03)
pH: 6 (ref 5.0–8.0)

## 2019-03-24 LAB — PSA: PSA: 2.4 ng/mL (ref <=4.0)

## 2019-03-24 LAB — FERRITIN: Ferritin: 300 ng/mL (ref 38–380)

## 2019-03-24 LAB — LIPID PANEL
Cholesterol: 125 mg/dL (ref <200)
HDL: 32 mg/dL — ABNORMAL LOW (ref >=40)
LDL Cholesterol (Calc): 72 mg/dL (calc)
Non-HDL Cholesterol (Calc): 93 mg/dL (calc) (ref <130)
Total CHOL/HDL Ratio: 3.9 (calc) (ref <5.0)
Triglycerides: 133 mg/dL (ref <150)

## 2019-03-24 LAB — VITAMIN B12: Vitamin B-12: 638 pg/mL (ref 200–1100)

## 2019-03-24 LAB — MICROALBUMIN / CREATININE URINE RATIO
Creatinine, Urine: 85 mg/dL (ref 20–320)
Microalb Creat Ratio: 20 mcg/mg creat (ref <30)
Microalb, Ur: 1.7 mg/dL

## 2019-03-24 LAB — MAGNESIUM: Magnesium: 2.1 mg/dL (ref 1.5–2.5)

## 2019-03-24 LAB — VITAMIN D 25 HYDROXY (VIT D DEFICIENCY, FRACTURES): Vit D, 25-Hydroxy: 71 ng/mL (ref 30–100)

## 2019-03-24 LAB — TSH: TSH: 0.82 mIU/L (ref 0.40–4.50)

## 2019-03-24 LAB — IRON, TOTAL/TOTAL IRON BINDING CAP
%SAT: 21 % (calc) (ref 20–48)
Iron: 65 ug/dL (ref 50–180)
TIBC: 306 mcg/dL (calc) (ref 250–425)

## 2019-03-24 LAB — TESTOSTERONE: Testosterone: 959 ng/dL — ABNORMAL HIGH (ref 250–827)

## 2019-03-27 ENCOUNTER — Ambulatory Visit: Payer: BLUE CROSS/BLUE SHIELD

## 2019-04-06 ENCOUNTER — Other Ambulatory Visit: Payer: Self-pay

## 2019-04-06 ENCOUNTER — Ambulatory Visit: Payer: BLUE CROSS/BLUE SHIELD

## 2019-04-06 VITALS — HR 78 | Temp 98.6°F | Ht 70.0 in | Wt 257.6 lb

## 2019-04-06 DIAGNOSIS — E349 Endocrine disorder, unspecified: Secondary | ICD-10-CM | POA: Diagnosis not present

## 2019-04-06 MED ORDER — TESTOSTERONE CYPIONATE 200 MG/ML IM SOLN
200.0000 mg | INTRAMUSCULAR | Status: DC
  Administered 2019-04-06 – 2019-04-20 (×2): 200 mg via INTRAMUSCULAR

## 2019-04-06 NOTE — Progress Notes (Signed)
Patient is here for a NV for his Testosterone Cypionate 200 mg/ml IMLEFTupper outer quadrant. Patient tolerated well and will return in 2 weeks for next injection

## 2019-04-17 ENCOUNTER — Other Ambulatory Visit: Payer: Self-pay | Admitting: Adult Health

## 2019-04-17 ENCOUNTER — Other Ambulatory Visit: Payer: Self-pay | Admitting: Internal Medicine

## 2019-04-17 DIAGNOSIS — Z794 Long term (current) use of insulin: Secondary | ICD-10-CM

## 2019-04-17 DIAGNOSIS — N182 Chronic kidney disease, stage 2 (mild): Secondary | ICD-10-CM

## 2019-04-17 DIAGNOSIS — E1122 Type 2 diabetes mellitus with diabetic chronic kidney disease: Secondary | ICD-10-CM

## 2019-04-20 ENCOUNTER — Ambulatory Visit: Payer: BLUE CROSS/BLUE SHIELD

## 2019-04-20 ENCOUNTER — Other Ambulatory Visit: Payer: Self-pay

## 2019-04-20 VITALS — BP 130/78 | Temp 98.7°F | Ht 70.0 in | Wt 260.0 lb

## 2019-04-20 DIAGNOSIS — E291 Testicular hypofunction: Secondary | ICD-10-CM

## 2019-04-20 DIAGNOSIS — E349 Endocrine disorder, unspecified: Secondary | ICD-10-CM

## 2019-04-20 NOTE — Progress Notes (Signed)
Patient is here for a NV for his Testosterone Cypionate 200 mg/ml IMRIGHTupper outer quadrant. Patient tolerated well and will return in 2 weeks for next injection  Patient's vitals were entered into EPIC.

## 2019-05-04 ENCOUNTER — Other Ambulatory Visit: Payer: Self-pay | Admitting: Internal Medicine

## 2019-05-05 ENCOUNTER — Ambulatory Visit: Payer: BLUE CROSS/BLUE SHIELD

## 2019-05-05 ENCOUNTER — Other Ambulatory Visit: Payer: Self-pay

## 2019-05-05 VITALS — BP 142/86 | HR 87 | Temp 97.7°F | Wt 255.8 lb

## 2019-05-05 DIAGNOSIS — E349 Endocrine disorder, unspecified: Secondary | ICD-10-CM | POA: Diagnosis not present

## 2019-05-05 MED ORDER — TESTOSTERONE CYPIONATE 200 MG/ML IM SOLN
200.0000 mg | Freq: Once | INTRAMUSCULAR | Status: AC
  Administered 2019-05-05: 200 mg via INTRAMUSCULAR

## 2019-05-05 NOTE — Progress Notes (Signed)
Patient is here for a NV for his Testosterone Cypionate 200 mg/ml IMLEFTupper outer quadrant. Patient tolerated well and will return in 2 weeks for next injection

## 2019-05-08 ENCOUNTER — Other Ambulatory Visit: Payer: Self-pay | Admitting: Adult Health

## 2019-05-08 DIAGNOSIS — N182 Chronic kidney disease, stage 2 (mild): Secondary | ICD-10-CM

## 2019-05-08 DIAGNOSIS — E1122 Type 2 diabetes mellitus with diabetic chronic kidney disease: Secondary | ICD-10-CM

## 2019-05-08 DIAGNOSIS — E119 Type 2 diabetes mellitus without complications: Secondary | ICD-10-CM

## 2019-05-08 DIAGNOSIS — N183 Chronic kidney disease, stage 3 unspecified: Secondary | ICD-10-CM

## 2019-05-08 DIAGNOSIS — E1159 Type 2 diabetes mellitus with other circulatory complications: Secondary | ICD-10-CM

## 2019-05-08 DIAGNOSIS — N521 Erectile dysfunction due to diseases classified elsewhere: Secondary | ICD-10-CM

## 2019-05-08 DIAGNOSIS — I10 Essential (primary) hypertension: Secondary | ICD-10-CM

## 2019-05-08 DIAGNOSIS — Z794 Long term (current) use of insulin: Secondary | ICD-10-CM

## 2019-05-19 ENCOUNTER — Other Ambulatory Visit: Payer: Self-pay

## 2019-05-19 ENCOUNTER — Ambulatory Visit: Payer: BC Managed Care – PPO

## 2019-05-19 DIAGNOSIS — E349 Endocrine disorder, unspecified: Secondary | ICD-10-CM

## 2019-05-19 MED ORDER — TESTOSTERONE CYPIONATE 200 MG/ML IM SOLN
200.0000 mg | Freq: Once | INTRAMUSCULAR | Status: AC
  Administered 2019-05-19: 200 mg via INTRAMUSCULAR

## 2019-05-19 NOTE — Progress Notes (Signed)
Patient is here for a NV for his Testosterone Cypionate 200 mg/ml IM RIGHTupper outer quadrant. Patient tolerated well and will return in 2 weeks for next injection. Vitals taken and recorded.

## 2019-05-22 ENCOUNTER — Other Ambulatory Visit: Payer: Self-pay | Admitting: Internal Medicine

## 2019-05-22 DIAGNOSIS — R300 Dysuria: Secondary | ICD-10-CM

## 2019-06-01 ENCOUNTER — Other Ambulatory Visit: Payer: Self-pay | Admitting: *Deleted

## 2019-06-01 MED ORDER — BISOPROLOL FUMARATE 10 MG PO TABS: 10.0000 mg | ORAL_TABLET | Freq: Every day | ORAL | 1 refills | Status: DC

## 2019-06-02 ENCOUNTER — Other Ambulatory Visit: Payer: Self-pay

## 2019-06-02 ENCOUNTER — Ambulatory Visit: Payer: BC Managed Care – PPO

## 2019-06-02 DIAGNOSIS — E349 Endocrine disorder, unspecified: Secondary | ICD-10-CM

## 2019-06-02 MED ORDER — TESTOSTERONE CYPIONATE 200 MG/ML IM SOLN
200.0000 mg | Freq: Once | INTRAMUSCULAR | Status: AC
  Administered 2019-06-02: 200 mg via INTRAMUSCULAR

## 2019-06-02 NOTE — Progress Notes (Signed)
Patient is here for a NV for his Testosterone Cypionate 200 mg/ml IM LEFTupper outer quadrant. Patient tolerated well and will return in 2 weeks for next injection. Vitals taken and recorded

## 2019-06-04 ENCOUNTER — Other Ambulatory Visit: Payer: Self-pay | Admitting: Internal Medicine

## 2019-06-04 DIAGNOSIS — I10 Essential (primary) hypertension: Secondary | ICD-10-CM

## 2019-06-04 MED ORDER — BISOPROLOL FUMARATE 10 MG PO TABS: ORAL_TABLET | ORAL | 1 refills | Status: DC

## 2019-06-16 ENCOUNTER — Ambulatory Visit: Payer: BC Managed Care – PPO

## 2019-06-16 ENCOUNTER — Other Ambulatory Visit: Payer: Self-pay

## 2019-06-16 VITALS — BP 130/76 | HR 81 | Temp 98.1°F | Ht 70.0 in | Wt 263.0 lb

## 2019-06-16 DIAGNOSIS — E349 Endocrine disorder, unspecified: Secondary | ICD-10-CM

## 2019-06-16 MED ORDER — TESTOSTERONE CYPIONATE 200 MG/ML IM SOLN
200.0000 mg | INTRAMUSCULAR | Status: DC
  Administered 2019-06-16 – 2019-06-30 (×2): 200 mg via INTRAMUSCULAR

## 2019-06-16 NOTE — Progress Notes (Signed)
Patient is here for a NV for his Testosterone Cypionate 200 mg/ml IMRIGHTupper outer quadrant. Patient tolerated well and will return in 2 weeks for next injection. Vitals taken and recorded

## 2019-06-26 ENCOUNTER — Ambulatory Visit: Payer: BLUE CROSS/BLUE SHIELD | Admitting: Physician Assistant

## 2019-06-29 NOTE — Progress Notes (Signed)
3 Month Follow Up  Assessment and Plan  Essential hypertension - continue medications: Zebeta10mg  daily, Norvasc 10mg  nightly, Telmisartan 40mg  daily  DASH diet, exercise and monitor at home. Call if greater than 130/80.  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      Has Cialis 20mg  PRN, doing well with this   Hyperlipidemia associated with type 2 diabetes mellitus ([place]) Continue Lipitor 20mg  -     Lipid panel Discussed dietary and exercise modifications Walks 5,000 steps daily, continue and work on increasing.  Controlled type 2 diabetes mellitus with stage 3 chronic kidney disease, with long-term current use of insulin ([place]) Discussed general issues about diabetes pathophysiology and management.,  Educational material distributed.,  Suggested low cholesterol diet.,  Encouraged aerobic exercise.,  Discussed foot care,  Reminded to get yearly retinal exam.  Insulin-requiring or dependent type II diabetes mellitus ([place]) -     Hemoglobin A1c Discussed dietary and exercise modification  CKD stage 2 due to type 2 diabetes mellitus (HCC) Increase fluids, avoid NSAIDS, monitor sugars, will monitor -     Hemoglobin A1c Increase fluids, avoid NSAIDS, monitor sugars, will monitor  Morbid obesity (HCC) - follow up 3 months for progress monitoring - increase veggies, decrease carbs - long discussion about weight loss, diet, and exercise -Encourage healthy  behaviors  Poor compliance with diet Discussed dietary and exercise modifications at length Discussed effect on glucose and over all health & immunity  [name] -     VITAMIN D 25 Hydroxy (Vit-D Deficiency)  Continue supplementation  Testosterone deficiency -     Testosterone -     testosterone cypionate (DEPOTESTOSTERONE CYPIONATE) injection 200 mg every two weeks, received today Improvement in overall well being  Nonallergic rhinitis Doing well at this time Continue to monitor  Obstructive sleep apnea Sleep apnea- Not wearing mask nightly Continue CPAP, discussed benefits Helping with daytime fatigue Discussed dietary and exercise modifications for weight loss  Medication management -Continued  Discussed med's effects and SE's. Screening labs and tests as requested with regular follow-up as recommended. Over 30 minutes of interview, exam, counseling, chart review and critical decision making was performed  HPI Patient presents for a three month follow up.  Reports overall he is doing well.  He has no health or medication concerns today.  He has history of HTN. HLD, DMII using insulin with CKD, ED, OSA on CPAP, [name] and weight.  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  His blood pressure has been controlled at home, today their BP is BP: 138/76 He does not workout but tried to get 5000 steps a day.  He denies chest pain, shortness of breath, dizziness.  He is on cholesterol medication, Zetia 10mg  daily and Lipitor 20mg  daily and denies myalgias. His cholesterol is at goal. The cholesterol last visit was:   Lab Results  Component Value Date   CHOL 125 03/23/2019   HDL  32 (L) 03/23/2019   LDLCALC 72 03/23/2019   TRIG 133 03/23/2019   CHOLHDL 3.9 03/23/2019   He has been working on diet and exercise for diabetes with diabetic chronic kidney disease, he is on bASA every other day  due to history of retroperitoneal hematoma, he is on ACE/ARB, he is on 70/30 insulin he is on 30 units in am and 30units with evening meal.  H is taking metformin 500mg , two tablets twice a day, he has continuous glucose monitor which is helping him to better control his glucose.  He has tried to improve eating at lunch time.  Previously he was working through.  Denies any low sugars, states low is 130 and high is 170 and fasting glucose ranges around 160-170 denies paresthesia of the feet, polydipsia, polyuria and visual disturbances. Last A1C was:  Lab Results  Component Value Date   HGBA1C 10.8 (H) 03/23/2019   Last GFR: Lab Results  Component Value Date   GFRAA 76 03/23/2019   Patient is on Vitamin D supplement.   Lab Results  Component Value Date   VD25OH 105 03/23/2019     Last PSA was: Lab Results  Component Value Date   PSA 2.4 03/23/2019   BMI is Body mass index is 37.45 kg/m., he is working on diet and exercise. Wt Readings from Last 3 Encounters:  06/30/19 261 lb (118.4 kg)  06/16/19 263 lb (119.3 kg)  06/02/19 257 lb (116.6 kg)   He has a history of testosterone deficiency and is on testosterone replacement. He states that the testosterone helps with his energy, libido, muscle mass. Received injection today, defer lab. Lab Results  Component Value Date   TESTOSTERONE 959 (H) 03/23/2019     Current Medications:  Current Outpatient Medications on File Prior to Visit  Medication Sig  . amLODipine (NORVASC) 10 MG tablet TAKE 1 TABLET BY MOUTH EVERY DAY  . atorvastatin (LIPITOR) 20 MG tablet TAKE 1 TABLET BY MOUTH EVERY DAY  . bisoprolol (ZEBETA) 10 MG tablet Take 1 tablet Daily for BP  . ciclesonide (OMNARIS) 50 MCG/ACT nasal spray Place 2 sprays into both nostrils daily as needed for allergies.   . clotrimazole-betamethasone (LOTRISONE) cream APPLY 3 TIMES A DAY AS NEEDED  . Continuous Blood Gluc Sensor (FREESTYLE LIBRE 14 DAY SENSOR) MISC USE AS DIRECTED  .  Continuous Blood Gluc Sensor (FREESTYLE LIBRE SENSOR SYSTEM) MISC 1 device with sensors DX Z79.4 insulin dependent diabetes  . Continuous Blood Gluc Sensor MISC 1 each by Does not apply route as directed. Use as directed every 10 days. May dispense FreeStyle [employer] or similar.  . ezetimibe (ZETIA) 10 MG tablet TAKE 1 TABLET BY MOUTH EVERY DAY  . fluconazole (DIFLUCAN) 150 MG tablet TAKE 1 TABLET DAILY FOR YEAST INFECTION  . glucose blood (FREESTYLE LITE) test strip Check blood sugar 3 times a day or as directed. [id number]  . Insulin Syringe-Needle U-100 (BD VEO INSULIN SYRINGE U/F) 31G X 15/64" 0.5 ML MISC Use 2 x /day for Insulin  . ketoconazole (NIZORAL) 2 % shampoo Apply 1 application topically 2 (two) times a week. Apply  & shampoo 2 x /week  . metFORMIN (GLUCOPHAGE-XR) 500 MG 24 hr tablet Take 2 tablets 2 x /day w/meals for Diabetes  . NOVOLIN 70/30 (70-30) 100 UNIT/ML injection INJECT UP TO 30 UNITS IN MORNING WITH FOOD & UP TO 30 UNITS WITH EVENING MEAL,ADJUST TO SUGARS  . senna-docusate (SENOKOT-S) 8.6-50 MG tablet Take by mouth.  Marland Kitchen  tadalafil (CIALIS) 20 MG tablet Take 1/2 to 1 tablet every 2-3 days as needed (Patient taking differently: Take 10-20 mg by mouth daily as needed for erectile dysfunction. Take 1/2 to 1 tablet every 2-3 days as needed)  . telmisartan (MICARDIS) 40 MG tablet TAKE 1 TABLET BY MOUTH EVERY DAY  . testosterone cypionate (DEPOTESTOSTERONE CYPIONATE) 200 MG/ML injection INJECT 2 ML INTO THE MUSCLE EVERY 2 WEEKS  . Vitamin D, Ergocalciferol, (DRISDOL) 1.25 MG (50000 UT) CAPS capsule TAKE ONE CAPSULE 2 DAYS A WEEK   Current Facility-Administered Medications on File Prior to Visit  Medication  . 0.9 %  sodium chloride infusion  . testosterone cypionate (DEPOTESTOSTERONE CYPIONATE) injection 200 mg   Allergies:  Allergies  Allergen Reactions  . Ppd [Tuberculin Purified Protein Derivative] Other (See Comments)    POSITIVE REACTION IN PAST 11/21/11 CXR NEG    Health Maintenance:  Immunization History  Administered Date(s) Administered  . Influenza Split 10/10/2013  . Influenza Whole 09/08/2012  . Influenza-Unspecified 09/22/2018  . Pneumococcal-Unspecified 12/31/2011  . Tdap 12/19/2009    Tetanus:2011 Pneumovax: 2003 Prevnar 13: Due, Discussed with patient related to DMII Flu vaccine: 2019 Zostavax: Due, discussed with patient  DEXA:   Colonoscopy: May 20 201 EGD: N/A  Eye Exam: [place] 2020 Dentist: Due  DM eye exam 06/2018  Patient Care Team: Unk Pinto, MD as PCP - General (Internal Medicine)  Medical History:  has Nonallergic rhinitis; Obstructive sleep apnea-questioned; Hypertension; Hyperlipidemia associated with type 2 diabetes mellitus ([place]); Type 2 diabetes mellitus, controlled, with renal complications ([place]); [name]; Testosterone deficiency; Morbid obesity ([place]); Poor compliance with diet; Medication management; 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   Surgical History:  He  has a past surgical history that includes Wisdom tooth extraction; Hernia repair; Colonoscopy; and Rotator cuff repair (Left, 2017 sept 5).   Family History:  His family history includes Cancer (age of onset: 106) in his father; Diabetes in his brother, father, and mother; Hyperlipidemia in his father and mother; Hypertension in his brother, father, and mother.   Social History:   reports that he quit smoking about 18 years ago. His smoking use included cigarettes. He has a 2.40 pack-year smoking history. He has never used smokeless tobacco. He reports current alcohol use. He reports that he does not use drugs.   Review of Systems:  ROS  Physical Exam: Estimated body mass index is 37.45 kg/m as calculated from the following:   Height as of  this encounter: 5\' 10"  (1.778 m).   Weight as of this encounter: 261 lb (118.4 kg). BP 138/76   Pulse 88   Temp 97.6 F (36.4 C)   Ht 5\' 10"  (1.778 m)   Wt 261 lb (118.4 kg)   SpO2 98%   BMI 37.45 kg/m  General Appearance: Well nourished, in no apparent distress.  Eyes: PERRLA, EOMs, conjunctiva no swelling or erythema, normal fundi and vessels.  Sinuses: No Frontal/maxillary tenderness  ENT/Mouth: Ext aud canals clear, normal light reflex with TMs without erythema, bulging. Good dentition. No erythema, swelling, or exudate on post pharynx. Tonsils not swollen or erythematous. Hearing normal.  Neck: Supple, thyroid normal. No bruits  Respiratory: Respiratory effort normal, BS equal bilaterally without rales, rhonchi, wheezing or stridor.  Cardio: RRR without murmurs, prominent S2, no rubs or gallops. Brisk peripheral pulses without edema.  Chest: symmetric, with normal excursions and percussion.  Abdomen: Soft, nontender, no guarding, rebound, hernias, masses, or organomegaly.  Lymphatics: Non tender without lymphadenopathy.  Genitourinary: defer Musculoskeletal: Full ROM all peripheral extremities,5/5 strength, and normal gait.  Skin: Warm, dry without rashes, lesions, ecchymosis. Neuro: Cranial nerves intact, reflexes equal bilaterally. Normal muscle tone, no cerebellar symptoms. Sensation intact.  Psych: Awake and oriented X 3, normal affect, Insight and Judgment appropriate.    Dayanne Yiu 10:00 AM [facility]

## 2019-06-30 ENCOUNTER — Other Ambulatory Visit: Payer: Self-pay

## 2019-06-30 ENCOUNTER — Encounter: Payer: Self-pay | Admitting: Adult Health Nurse Practitioner

## 2019-06-30 ENCOUNTER — Other Ambulatory Visit: Payer: Self-pay | Admitting: Adult Health Nurse Practitioner

## 2019-06-30 ENCOUNTER — Ambulatory Visit: Payer: BC Managed Care – PPO | Admitting: Adult Health Nurse Practitioner

## 2019-06-30 VITALS — BP 138/76 | HR 88 | Temp 97.6°F | Ht 70.0 in | Wt 261.0 lb

## 2019-06-30 DIAGNOSIS — Z79899 Other long term (current) drug therapy: Secondary | ICD-10-CM | POA: Diagnosis not present

## 2019-06-30 DIAGNOSIS — Z9119 Patient's noncompliance with other medical treatment and regimen: Secondary | ICD-10-CM

## 2019-06-30 DIAGNOSIS — E349 Endocrine disorder, unspecified: Secondary | ICD-10-CM

## 2019-06-30 DIAGNOSIS — N183 Chronic kidney disease, stage 3 unspecified: Secondary | ICD-10-CM

## 2019-06-30 DIAGNOSIS — J310 Chronic rhinitis: Secondary | ICD-10-CM

## 2019-06-30 DIAGNOSIS — E1122 Type 2 diabetes mellitus with diabetic chronic kidney disease: Secondary | ICD-10-CM | POA: Diagnosis not present

## 2019-06-30 DIAGNOSIS — N521 Erectile dysfunction due to diseases classified elsewhere: Secondary | ICD-10-CM

## 2019-06-30 DIAGNOSIS — E785 Hyperlipidemia, unspecified: Secondary | ICD-10-CM | POA: Diagnosis not present

## 2019-06-30 DIAGNOSIS — G4733 Obstructive sleep apnea (adult) (pediatric): Secondary | ICD-10-CM

## 2019-06-30 DIAGNOSIS — E559 Vitamin D deficiency, unspecified: Secondary | ICD-10-CM

## 2019-06-30 DIAGNOSIS — Z91199 Patient's noncompliance with other medical treatment and regimen due to unspecified reason: Secondary | ICD-10-CM

## 2019-06-30 DIAGNOSIS — I10 Essential (primary) hypertension: Secondary | ICD-10-CM | POA: Diagnosis not present

## 2019-06-30 DIAGNOSIS — E1159 Type 2 diabetes mellitus with other circulatory complications: Secondary | ICD-10-CM

## 2019-06-30 DIAGNOSIS — Z794 Long term (current) use of insulin: Secondary | ICD-10-CM

## 2019-06-30 DIAGNOSIS — E119 Type 2 diabetes mellitus without complications: Secondary | ICD-10-CM

## 2019-06-30 DIAGNOSIS — E1169 Type 2 diabetes mellitus with other specified complication: Secondary | ICD-10-CM | POA: Diagnosis not present

## 2019-06-30 NOTE — Progress Notes (Signed)
Patient is here for a NV for his Testosterone Cypionate 200 mg/ml IMLEFTupper outer quadrant. Patient tolerated well and will return in 2 weeks for next injection. Vitals taken and recorded

## 2019-06-30 NOTE — Patient Instructions (Signed)
We will contact you in 1-3 days with your lab results.   Consider looking at patches to go over your Freestyle sensor to keep them in place.  Here are some recommendations:  Simpatch or Fixic to help keep your patches on in the summer.  Continue to increase your water intake  80oz of water a day or more.   - Vit D  And Vit C 1,000 mg  are recommended to help protect  against the Covid_19 and other Corona viruses.   - Also it's recommended to take Zinc 50 mg to help  protect against the Covid_19  And best place to get  is also on [email or web address][email or web address] and don't pay more than 6-8 cents /pill !   =============================== Coronavirus (COVID-19) Are you at risk?  Are you at risk for the Coronavirus (COVID-19)?  To be considered HIGH RISK for Coronavirus (COVID-19), you have to meet the following criteria:  . Traveled to [place], [place], [place], [place] or [place]; or in the [place] to [place], [place], [place]  . or [place]; and have fever, cough, and shortness of breath within the last 2 weeks of travel OR . Been in close contact with a person diagnosed with COVID-19 within the last 2 weeks and have  . fever, cough,and shortness of breath .  . IF YOU DO NOT MEET THESE CRITERIA, YOU ARE CONSIDERED LOW RISK FOR COVID-19.  What to do if you are HIGH RISK for COVID-19?  Marland Kitchen If you are having a medical emergency, call 911. . Seek medical care right away. Before you go to a doctor's office, urgent care or emergency department, .  call ahead and tell them about your recent travel, contact with someone diagnosed with COVID-19  .  and your symptoms.  . You should receive instructions from your physician's office regarding next steps of care.  . When you arrive at healthcare provider, tell the healthcare staff immediately you have returned from  . visiting [place], [place], [place], [place] or [place]; or traveled in the [place] to [place], [place],  . [place] or [place]  in the last two weeks or you have been in close contact with a person diagnosed with  . COVID-19 in the last 2 weeks.   . Tell the health care staff about your symptoms: fever, cough and shortness of breath. . After you have been seen by a medical provider, you will be either: o Tested for (COVID-19) and discharged home on quarantine except to seek medical care if  o symptoms worsen, and asked to  - Stay home and avoid contact with others until you get your results (4-5 days)  - Avoid travel on public transportation if possible (such as bus, train, or airplane) or o Sent to the Emergency Department by EMS for evaluation, COVID-19 testing  and  o possible admission depending on your condition and test results.  What to do if you are LOW RISK for COVID-19?  Reduce your risk of any infection by using the same precautions used for avoiding the common cold or flu:  Marland Kitchen Wash your hands often with soap and warm water for at least 20 seconds.  If soap and water are not readily available,  . use an alcohol-based hand sanitizer with at least 60% alcohol.  . If coughing or sneezing, cover your mouth and nose by coughing or sneezing into the elbow areas of your shirt or coat, .  into a tissue or into  your sleeve (not your hands). . Avoid shaking hands with others and consider head nods or verbal greetings only. . Avoid touching your eyes, nose, or mouth with unwashed hands.  . Avoid close contact with people who are sick. . Avoid places or events with large numbers of people in one location, like concerts or sporting events. . Carefully consider travel plans you have or are making. . If you are planning any travel outside or inside the [place], visit the CDC's Travelers' Health webpage for the latest health notices. . If you have some symptoms but not all symptoms, continue to monitor at home and seek medical attention  . if your symptoms worsen. . If you are having a medical emergency, call  911. >>>>>>>>>>>>>>>>>>>>>>>>>>>> Preventive Care for Adults  A healthy lifestyle and preventive care can promote health and wellness. Preventive health guidelines for men include the following key practices:  A routine yearly physical is a good way to check with your health care provider about your health and preventative screening. It is a chance to share any concerns and updates on your health and to receive a thorough exam.  Visit your dentist for a routine exam and preventative care every 6 months. Brush your teeth twice a day and floss once a day. Good oral hygiene prevents tooth decay and gum disease.  The frequency of eye exams is based on your age, health, family medical history, use of contact lenses, and other factors. Follow your health care provider's recommendations for frequency of eye exams.  Eat a healthy diet. Foods such as vegetables, fruits, whole grains, low-fat dairy products, and lean protein foods contain the nutrients you need without too many calories. Decrease your intake of foods high in solid fats, added sugars, and salt. Eat the right amount of calories for you. Get information about a proper diet from your health care provider, if necessary.  Regular physical exercise is one of the most important things you can do for your health. Most adults should get at least 150 minutes of moderate-intensity exercise (any activity that increases your heart rate and causes you to sweat) each week. In addition, most adults need muscle-strengthening exercises on 2 or more days a week.  Maintain a healthy weight. The body mass index (BMI) is a screening tool to identify possible weight problems. It provides an estimate of body fat based on height and weight. Your health care provider can find your BMI and can help you achieve or maintain a healthy weight. For adults 20 years and older:  A BMI below 18.5 is considered underweight.  A BMI of 18.5 to 24.9 is normal.  A BMI of 25 to  29.9 is considered overweight.  A BMI of 30 and above is considered obese.  Maintain normal blood lipids and cholesterol levels by exercising and minimizing your intake of saturated fat. Eat a balanced diet with plenty of fruit and vegetables. Blood tests for lipids and cholesterol should begin at age 75 and be repeated every 5 years. If your lipid or cholesterol levels are high, you are over 50, or you are at high risk for heart disease, you may need your cholesterol levels checked more frequently. Ongoing high lipid and cholesterol levels should be treated with medicines if diet and exercise are not working.  If you smoke, find out from your health care provider how to quit. If you do not use tobacco, do not start.  Lung cancer screening is recommended for adults aged 30-80 years who  are at high risk for developing lung cancer because of a history of smoking. A yearly low-dose CT scan of the lungs is recommended for people who have at least a 30-pack-year history of smoking and are a current smoker or have quit within the past 15 years. A pack year of smoking is smoking an average of 1 pack of cigarettes a day for 1 year (for example: 1 pack a day for 30 years or 2 packs a day for 15 years). Yearly screening should continue until the smoker has stopped smoking for at least 15 years. Yearly screening should be stopped for people who develop a health problem that would prevent them from having lung cancer treatment.  If you choose to drink alcohol, do not have more than 2 drinks per day. One drink is considered to be 12 ounces (355 mL) of beer, 5 ounces (148 mL) of wine, or 1.5 ounces (44 mL) of liquor.  Avoid use of street drugs. Do not share needles with anyone. Ask for help if you need support or instructions about stopping the use of drugs.  High blood pressure causes heart disease and increases the risk of stroke. Your blood pressure should be checked at least every 1-2 years. Ongoing high blood  pressure should be treated with medicines, if weight loss and exercise are not effective.  If you are 63-73 years old, ask your health care provider if you should take aspirin to prevent heart disease.  Diabetes screening involves taking a blood sample to check your fasting blood sugar level. This should be done once every 3 years, after age 64, if you are within normal weight and without risk factors for diabetes. Testing should be considered at a younger age or be carried out more frequently if you are overweight and have at least 1 risk factor for diabetes.  Colorectal cancer can be detected and often prevented. Most routine colorectal cancer screening begins at the age of 21 and continues through age 39. However, your health care provider may recommend screening at an earlier age if you have risk factors for colon cancer. On a yearly basis, your health care provider may provide home test kits to check for hidden blood in the stool. Use of a small camera at the end of a tube to directly examine the colon (sigmoidoscopy or colonoscopy) can detect the earliest forms of colorectal cancer. Talk to your health care provider about this at age 74, when routine screening begins. Direct exam of the colon should be repeated every 5-10 years through age 11, unless early forms of precancerous polyps or small growths are found.   Talk with your health care provider about prostate cancer screening.  Testicular cancer screening isrecommended for adult males. Screening includes self-exam, a health care provider exam, and other screening tests. Consult with your health care provider about any symptoms you have or any concerns you have about testicular cancer.  Use sunscreen. Apply sunscreen liberally and repeatedly throughout the day. You should seek shade when your shadow is shorter than you. Protect yourself by wearing long sleeves, pants, a wide-brimmed hat, and sunglasses year round, whenever you are  outdoors.  Once a month, do a whole-body skin exam, using a mirror to look at the skin on your back. Tell your health care provider about new moles, moles that have irregular borders, moles that are larger than a pencil eraser, or moles that have changed in shape or color.  Stay current with required vaccines (immunizations).  Influenza  vaccine. All adults should be immunized every year.  Tetanus, diphtheria, and acellular pertussis (Td, Tdap) vaccine. An adult who has not previously received Tdap or who does not know his vaccine status should receive 1 dose of Tdap. This initial dose should be followed by tetanus and diphtheria toxoids (Td) booster doses every 10 years. Adults with an unknown or incomplete history of completing a 3-dose immunization series with Td-containing vaccines should begin or complete a primary immunization series including a Tdap dose. Adults should receive a Td booster every 10 years.  Varicella vaccine. An adult without evidence of immunity to varicella should receive 2 doses or a second dose if he has previously received 1 dose.  Human papillomavirus (HPV) vaccine. Males aged 53-21 years who have not received the vaccine previously should receive the 3-dose series. Males aged 22-26 years may be immunized. Immunization is recommended through the age of 39 years for any male who has sex with males and did not get any or all doses earlier. Immunization is recommended for any person with an immunocompromised condition through the age of 30 years if he did not get any or all doses earlier. During the 3-dose series, the second dose should be obtained 4-8 weeks after the first dose. The third dose should be obtained 24 weeks after the first dose and 16 weeks after the second dose.  Zoster vaccine. One dose is recommended for adults aged 39 years or older unless certain conditions are present.    PREVNAR  - Pneumococcal 13-valent conjugate (PCV13) vaccine. When indicated, a  person who is uncertain of his immunization history and has no record of immunization should receive the PCV13 vaccine. An adult aged 3 years or older who has certain medical conditions and has not been previously immunized should receive 1 dose of PCV13 vaccine. This PCV13 should be followed with a dose of pneumococcal polysaccharide (PPSV23) vaccine. The PPSV23 vaccine dose should be obtained at least 1 r more year(s) after the dose of PCV13 vaccine. An adult aged 57 years or older who has certain medical conditions and previously received 1 or more doses of PPSV23 vaccine should receive 1 dose of PCV13. The PCV13 vaccine dose should be obtained 1 or more years after the last PPSV23 vaccine dose.    PNEUMOVAX - Pneumococcal polysaccharide (PPSV23) vaccine. When PCV13 is also indicated, PCV13 should be obtained first. All adults aged 70 years and older should be immunized. An adult younger than age 43 years who has certain medical conditions should be immunized. Any person who resides in a nursing home or long-term care facility should be immunized. An adult smoker should be immunized. People with an immunocompromised condition and certain other conditions should receive both PCV13 and PPSV23 vaccines. People with human immunodeficiency virus (HIV) infection should be immunized as soon as possible after diagnosis. Immunization during chemotherapy or radiation therapy should be avoided. Routine use of PPSV23 vaccine is not recommended for American Indians, [place] Natives, or people younger than 65 years unless there are medical conditions that require PPSV23 vaccine. When indicated, people who have unknown immunization and have no record of immunization should receive PPSV23 vaccine. One-time revaccination 5 years after the first dose of PPSV23 is recommended for people aged 19-64 years who have chronic kidney failure, nephrotic syndrome, asplenia, or immunocompromised conditions. People who received 1-2 doses  of PPSV23 before age 90 years should receive another dose of PPSV23 vaccine at age 57 years or later if at least 5 years have passed  since the previous dose. Doses of PPSV23 are not needed for people immunized with PPSV23 at or after age 18 years.    Hepatitis A vaccine. Adults who wish to be protected from this disease, have certain high-risk conditions, work with hepatitis A-infected animals, work in hepatitis A research labs, or travel to or work in countries with a high rate of hepatitis A should be immunized. Adults who were previously unvaccinated and who anticipate close contact with an international adoptee during the first 60 days after arrival in the [place] from a country with a high rate of hepatitis A should be immunized.    Hepatitis B vaccine. Adults should be immunized if they wish to be protected from this disease, have certain high-risk conditions, may be exposed to blood or other infectious body fluids, are household contacts or sex partners of hepatitis B positive people, are clients or workers in certain care facilities, or travel to or work in countries with a high rate of hepatitis B.   Preventive Service / Frequency   Ages 38 to 36  Blood pressure check.  Lipid and cholesterol check  Lung cancer screening. / Every year if you are aged 25-80 years and have a 30-pack-year history of smoking and currently smoke or have quit within the past 15 years. Yearly screening is stopped once you have quit smoking for at least 15 years or develop a health problem that would prevent you from having lung cancer treatment.  Fecal occult blood test (FOBT) of stool. / Every year beginning at age 50 and continuing until age 98. You may not have to do this test if you get a colonoscopy every 10 years.  Flexible sigmoidoscopy** or colonoscopy.** / Every 5 years for a flexible sigmoidoscopy or every 10 years for a colonoscopy beginning at age 39 and continuing until age 13. Screening  for abdominal aortic aneurysm (AAA)  by ultrasound is recommended for people who have history of high blood pressure or who are current or former smokers. +++++++++++ Recommend Adult Low Dose Aspirin or  coated  Aspirin 81 mg daily  To reduce risk of Colon Cancer 40 %,  Skin Cancer 26 % ,  Malignant Melanoma 46%  and  Pancreatic cancer 60% ++++++++++++++++++++ Vitamin D goal  is between 70-100.  Please make sure that you are taking your Vitamin D as directed.  It is very important as a natural anti-inflammatory  helping hair, skin, and nails, as well as reducing stroke and heart attack risk.  It helps your bones and helps with mood. It also decreases numerous cancer risks so please take it as directed.  Low Vit D is associated with a 200-300% higher risk for CANCER  and 200-300% higher risk for HEART   ATTACK  &  STROKE.   .....................................Marland Kitchen It is also associated with higher death rate at younger ages,  autoimmune diseases like Rheumatoid arthritis, Lupus, Multiple Sclerosis.    Also many other serious conditions, like depression, Alzheimer's Dementia, infertility, muscle aches, fatigue, fibromyalgia - just to name a few. +++++++++++++++++++++ Recommend the book "The END of DIETING" by Dr Excell Seltzer  & the book "The END of DIABETES " by Dr Excell Seltzer At [email or web address] - get book & Audio CD's    Being diabetic has a  300% increased risk for heart attack, stroke, cancer, and alzheimer- type vascular dementia. It is very important that you work harder with diet by avoiding all foods that are white. Avoid white rice (brown & wild  rice is OK), white potatoes (sweetpotatoes in moderation is OK), White bread or wheat bread or anything made out of white flour like bagels, donuts, rolls, buns, biscuits, cakes, pastries, cookies, pizza crust, and pasta (made from white flour & egg whites) - vegetarian pasta or spinach or wheat pasta is OK. Multigrain breads like Arnold's or  [employer], or multigrain sandwich thins or flatbreads.  Diet, exercise and weight loss can reverse and cure diabetes in the early stages.  Diet, exercise and weight loss is very important in the control and prevention of complications of diabetes which affects every system in your body, ie. Brain - dementia/stroke, eyes - glaucoma/blindness, heart - heart attack/heart failure, kidneys - dialysis, stomach - gastric paralysis, intestines - malabsorption, nerves - severe painful neuritis, circulation - gangrene & loss of a leg(s), and finally cancer and Alzheimers.    I recommend avoid fried & greasy foods,  sweets/candy, white rice (brown or wild rice or Quinoa is OK), white potatoes (sweet potatoes are OK) - anything made from white flour - bagels, doughnuts, rolls, buns, biscuits,white and wheat breads, pizza crust and traditional pasta made of white flour & egg white(vegetarian pasta or spinach or wheat pasta is OK).  Multi-grain bread is OK - like multi-grain flat bread or sandwich thins. Avoid alcohol in excess. Exercise is also important.    Eat all the vegetables you want - avoid meat, especially red meat and dairy - especially cheese.  Cheese is the most concentrated form of trans-fats which is the worst thing to clog up our arteries. Veggie cheese is OK which can be found in the fresh produce section at [employer] or [employer] or [employer]  ++++++++++++++++++++++ DASH Eating Plan  DASH stands for "Dietary Approaches to Stop Hypertension."   The DASH eating plan is a healthy eating plan that has been shown to reduce high blood pressure (hypertension). Additional health benefits may include reducing the risk of type 2 diabetes mellitus, heart disease, and stroke. The DASH eating plan may also help with weight loss. WHAT DO I NEED TO KNOW ABOUT THE DASH EATING PLAN? For the DASH eating plan, you will follow these general guidelines:  Choose foods with a percent daily value for sodium of  less than 5% (as listed on the food label).  Use salt-free seasonings or herbs instead of table salt or sea salt.  Check with your health care provider or pharmacist before using salt substitutes.  Eat lower-sodium products, often labeled as "lower sodium" or "no salt added."  Eat fresh foods.  Eat more vegetables, fruits, and low-fat dairy products.  Choose whole grains. Look for the word "whole" as the first word in the ingredient list.  Choose fish   Limit sweets, desserts, sugars, and sugary drinks.  Choose heart-healthy fats.  Eat veggie cheese   Eat more home-cooked food and less restaurant, buffet, and fast food.  Limit fried foods.  Cook foods using methods other than frying.  Limit canned vegetables. If you do use them, rinse them well to decrease the sodium.  When eating at a restaurant, ask that your food be prepared with less salt, or no salt if possible.                      WHAT FOODS CAN I EAT? Read Dr Fara Olden Fuhrman's books on The End of Dieting & The End of Diabetes  Grains Whole grain or whole wheat bread. Brown rice. Whole grain or whole wheat  pasta. Quinoa, bulgur, and whole grain cereals. Low-sodium cereals. Corn or whole wheat flour tortillas. Whole grain cornbread. Whole grain crackers. Low-sodium crackers.  Vegetables Fresh or frozen vegetables (raw, steamed, roasted, or grilled). Low-sodium or reduced-sodium tomato and vegetable juices. Low-sodium or reduced-sodium tomato sauce and paste. Low-sodium or reduced-sodium canned vegetables.   Fruits All fresh, canned (in natural juice), or frozen fruits.  Protein Products  All fish and seafood.  Dried beans, peas, or lentils. Unsalted nuts and seeds. Unsalted canned beans.  Dairy Low-fat dairy products, such as skim or 1% milk, 2% or reduced-fat cheeses, low-fat ricotta or cottage cheese, or plain low-fat yogurt. Low-sodium or reduced-sodium cheeses.  Fats and Oils Tub margarines without trans  fats. Light or reduced-fat mayonnaise and salad dressings (reduced sodium). Avocado. Safflower, olive, or canola oils. Natural peanut or almond butter.  Other Unsalted popcorn and pretzels. The items listed above may not be a complete list of recommended foods or beverages. Contact your dietitian for more options.  +++++++++++++++++++  WHAT FOODS ARE NOT RECOMMENDED? Grains/ White flour or wheat flour White bread. White pasta. White rice. Refined cornbread. Bagels and croissants. Crackers that contain trans fat.  Vegetables  Creamed or fried vegetables. Vegetables in a . Regular canned vegetables. Regular canned tomato sauce and paste. Regular tomato and vegetable juices.  Fruits Dried fruits. Canned fruit in light or heavy syrup. Fruit juice.  Meat and Other Protein Products Meat in general - RED meat & White meat.  Fatty cuts of meat. Ribs, chicken wings, all processed meats as bacon, sausage, bologna, salami, fatback, hot dogs, bratwurst and packaged luncheon meats.  Dairy Whole or 2% milk, cream, half-and-half, and cream cheese. Whole-fat or sweetened yogurt. Full-fat cheeses or blue cheese. Non-dairy creamers and whipped toppings. Processed cheese, cheese spreads, or cheese curds.  Condiments Onion and garlic salt, seasoned salt, table salt, and sea salt. Canned and packaged gravies. Worcestershire sauce. Tartar sauce. Barbecue sauce. Teriyaki sauce. Soy sauce, including reduced sodium. Steak sauce. Fish sauce. Oyster sauce. Cocktail sauce. Horseradish. Ketchup and mustard. Meat flavorings and tenderizers. Bouillon cubes. Hot sauce. Tabasco sauce. Marinades. Taco seasonings. Relishes.  Fats and Oils Butter, stick margarine, lard, shortening and bacon fat. Coconut, palm kernel, or palm oils. Regular salad dressings.  Pickles and olives. Salted popcorn and pretzels.  The items listed above may not be a complete list of foods and beverages to avoid.

## 2019-07-01 LAB — CBC WITH DIFFERENTIAL/PLATELET
Absolute Monocytes: 889 cells/uL (ref 200–950)
Basophils Absolute: 23 cells/uL (ref 0–200)
Basophils Relative: 0.3 %
Eosinophils Absolute: 213 cells/uL (ref 15–500)
Eosinophils Relative: 2.8 %
HCT: 56.2 % — ABNORMAL HIGH (ref 38.5–50.0)
Hemoglobin: 17.8 g/dL — ABNORMAL HIGH (ref 13.2–17.1)
Lymphs Abs: 2120 cells/uL (ref 850–3900)
MCH: 26.7 pg — ABNORMAL LOW (ref 27.0–33.0)
MCHC: 31.7 g/dL — ABNORMAL LOW (ref 32.0–36.0)
MCV: 84.4 fL (ref 80.0–100.0)
MPV: 10.2 fL (ref 7.5–12.5)
Monocytes Relative: 11.7 %
Neutro Abs: 4355 cells/uL (ref 1500–7800)
Neutrophils Relative %: 57.3 %
Platelets: 321 10*3/uL (ref 140–400)
RBC: 6.66 10*6/uL — ABNORMAL HIGH (ref 4.20–5.80)
RDW: 14.5 % (ref 11.0–15.0)
Total Lymphocyte: 27.9 %
WBC: 7.6 10*3/uL (ref 3.8–10.8)

## 2019-07-01 LAB — COMPLETE METABOLIC PANEL WITH GFR
AG Ratio: 1.5 (calc) (ref 1.0–2.5)
ALT: 18 U/L (ref 9–46)
AST: 22 U/L (ref 10–35)
Albumin: 4.1 g/dL (ref 3.6–5.1)
Alkaline phosphatase (APISO): 64 U/L (ref 35–144)
BUN/Creatinine Ratio: 17 (calc) (ref 6–22)
BUN: 23 mg/dL (ref 7–25)
CO2: 27 mmol/L (ref 20–32)
Calcium: 9.7 mg/dL (ref 8.6–10.3)
Chloride: 101 mmol/L (ref 98–110)
Creat: 1.39 mg/dL — ABNORMAL HIGH (ref 0.70–1.33)
GFR, Est African American: 65 mL/min/{1.73_m2} (ref >=60)
GFR, Est Non African American: 56 mL/min/{1.73_m2} — ABNORMAL LOW (ref >=60)
Globulin: 2.7 g/dL (calc) (ref 1.9–3.7)
Glucose, Bld: 182 mg/dL — ABNORMAL HIGH (ref 65–99)
Potassium: 4.4 mmol/L (ref 3.5–5.3)
Sodium: 137 mmol/L (ref 135–146)
Total Bilirubin: 0.3 mg/dL (ref 0.2–1.2)
Total Protein: 6.8 g/dL (ref 6.1–8.1)

## 2019-07-01 LAB — HEMOGLOBIN A1C
Hgb A1c MFr Bld: 7.9 % of total Hgb — ABNORMAL HIGH (ref <5.7)
Mean Plasma Glucose: 180 (calc)
eAG (mmol/L): 10 (calc)

## 2019-07-01 LAB — LIPID PANEL
Cholesterol: 94 mg/dL (ref <200)
HDL: 25 mg/dL — ABNORMAL LOW (ref >=40)
LDL Cholesterol (Calc): 42 mg/dL (calc)
Non-HDL Cholesterol (Calc): 69 mg/dL (calc) (ref <130)
Total CHOL/HDL Ratio: 3.8 (calc) (ref <5.0)
Triglycerides: 198 mg/dL — ABNORMAL HIGH (ref <150)

## 2019-07-01 LAB — INSULIN, RANDOM: Insulin: 47.5 u[IU]/mL — ABNORMAL HIGH

## 2019-07-01 LAB — MAGNESIUM: Magnesium: 1.7 mg/dL (ref 1.5–2.5)

## 2019-07-01 LAB — VITAMIN D 25 HYDROXY (VIT D DEFICIENCY, FRACTURES): Vit D, 25-Hydroxy: 86 ng/mL (ref 30–100)

## 2019-07-14 ENCOUNTER — Ambulatory Visit: Payer: BC Managed Care – PPO

## 2019-07-14 ENCOUNTER — Other Ambulatory Visit: Payer: Self-pay

## 2019-07-14 DIAGNOSIS — E349 Endocrine disorder, unspecified: Secondary | ICD-10-CM | POA: Diagnosis not present

## 2019-07-14 MED ORDER — TESTOSTERONE CYPIONATE 200 MG/ML IM SOLN
200.0000 mg | Freq: Once | INTRAMUSCULAR | Status: AC
  Administered 2019-07-14: 09:00:00 200 mg via INTRAMUSCULAR

## 2019-07-14 NOTE — Progress Notes (Signed)
Patient is here for a NV for his Testosterone Cypionate 200 mg/ml IMRIGHTupper outer quadrant. Patient tolerated well and will return in 2 weeks for next injection. Vitals taken and recorded

## 2019-07-28 ENCOUNTER — Ambulatory Visit: Payer: BC Managed Care – PPO

## 2019-07-28 ENCOUNTER — Other Ambulatory Visit: Payer: Self-pay

## 2019-07-28 VITALS — BP 122/76 | HR 66 | Temp 97.5°F | Ht 70.0 in | Wt 260.0 lb

## 2019-07-28 DIAGNOSIS — E291 Testicular hypofunction: Secondary | ICD-10-CM | POA: Diagnosis not present

## 2019-07-28 DIAGNOSIS — Z79899 Other long term (current) drug therapy: Secondary | ICD-10-CM

## 2019-07-28 DIAGNOSIS — E349 Endocrine disorder, unspecified: Secondary | ICD-10-CM

## 2019-07-28 MED ORDER — TESTOSTERONE CYPIONATE 200 MG/ML IM SOLN
200.0000 mg | INTRAMUSCULAR | Status: DC
  Administered 2019-07-28: 200 mg via INTRAMUSCULAR

## 2019-07-28 NOTE — Progress Notes (Signed)
Patient is here for a NV for his Testosterone Cypionate 200 mg/ml IMLEFTupper outer quadrant. Patient tolerated well and will return in 2 weeks for next injection. Vitals taken and recorded

## 2019-08-11 ENCOUNTER — Ambulatory Visit: Payer: BC Managed Care – PPO

## 2019-08-11 ENCOUNTER — Other Ambulatory Visit: Payer: Self-pay

## 2019-08-11 VITALS — BP 128/88 | HR 76 | Temp 97.3°F | Ht 70.0 in | Wt 261.0 lb

## 2019-08-11 DIAGNOSIS — E291 Testicular hypofunction: Secondary | ICD-10-CM

## 2019-08-11 DIAGNOSIS — Z79899 Other long term (current) drug therapy: Secondary | ICD-10-CM | POA: Diagnosis not present

## 2019-08-11 DIAGNOSIS — E349 Endocrine disorder, unspecified: Secondary | ICD-10-CM

## 2019-08-11 MED ORDER — TESTOSTERONE CYPIONATE 200 MG/ML IM SOLN
200.0000 mg | INTRAMUSCULAR | Status: DC
  Administered 2019-08-11: 200 mg via INTRAMUSCULAR

## 2019-08-11 NOTE — Progress Notes (Signed)
    Patient is here for a NV for his Testosterone Cypionate 200 mg/ml  IMRIGHTupper outer quadrant. Patient tolerated well and will return in 2 weeks for next injection. Vitals taken and recorded

## 2019-08-25 ENCOUNTER — Ambulatory Visit: Payer: BC Managed Care – PPO

## 2019-08-27 ENCOUNTER — Other Ambulatory Visit: Payer: Self-pay

## 2019-08-27 ENCOUNTER — Ambulatory Visit: Payer: BC Managed Care – PPO

## 2019-08-28 ENCOUNTER — Other Ambulatory Visit: Payer: Self-pay | Admitting: Internal Medicine

## 2019-08-28 ENCOUNTER — Other Ambulatory Visit: Payer: Self-pay | Admitting: Adult Health

## 2019-08-28 DIAGNOSIS — E349 Endocrine disorder, unspecified: Secondary | ICD-10-CM

## 2019-08-28 MED ORDER — TESTOSTERONE CYPIONATE 200 MG/ML IM SOLN: INTRAMUSCULAR | 2 refills | Status: DC

## 2019-09-08 ENCOUNTER — Ambulatory Visit (INDEPENDENT_AMBULATORY_CARE_PROVIDER_SITE_OTHER): Payer: BC Managed Care – PPO

## 2019-09-08 ENCOUNTER — Other Ambulatory Visit: Payer: Self-pay

## 2019-09-08 VITALS — BP 124/78 | HR 68 | Temp 97.7°F | Ht 70.0 in | Wt 256.0 lb

## 2019-09-08 DIAGNOSIS — Z79899 Other long term (current) drug therapy: Secondary | ICD-10-CM

## 2019-09-08 DIAGNOSIS — E349 Endocrine disorder, unspecified: Secondary | ICD-10-CM

## 2019-09-08 DIAGNOSIS — E119 Type 2 diabetes mellitus without complications: Secondary | ICD-10-CM | POA: Diagnosis not present

## 2019-09-08 DIAGNOSIS — I10 Essential (primary) hypertension: Secondary | ICD-10-CM | POA: Diagnosis not present

## 2019-09-08 DIAGNOSIS — N179 Acute kidney failure, unspecified: Secondary | ICD-10-CM | POA: Diagnosis not present

## 2019-09-08 DIAGNOSIS — S37019A Minor contusion of unspecified kidney, initial encounter: Secondary | ICD-10-CM | POA: Diagnosis not present

## 2019-09-08 MED ORDER — TESTOSTERONE CYPIONATE 200 MG/ML IM SOLN
200.0000 mg | INTRAMUSCULAR | Status: DC
  Administered 2019-09-08: 200 mg via INTRAMUSCULAR

## 2019-09-08 NOTE — Progress Notes (Signed)
REPORTS FOR test injection  Vitals entered.  Injection given in the RIGHT VENTROGLUTEAL IM without any issues.  Concerns & questions answered at visit.  Patient will return on 09/23/19 at 8:45----wks for next TEST injection.

## 2019-09-11 ENCOUNTER — Other Ambulatory Visit: Payer: Self-pay | Admitting: Internal Medicine

## 2019-09-14 ENCOUNTER — Ambulatory Visit: Payer: BC Managed Care – PPO

## 2019-09-18 DIAGNOSIS — Z23 Encounter for immunization: Secondary | ICD-10-CM | POA: Diagnosis not present

## 2019-09-23 ENCOUNTER — Other Ambulatory Visit: Payer: Self-pay

## 2019-09-23 ENCOUNTER — Ambulatory Visit: Payer: BC Managed Care – PPO

## 2019-09-23 DIAGNOSIS — E349 Endocrine disorder, unspecified: Secondary | ICD-10-CM | POA: Diagnosis not present

## 2019-09-23 MED ORDER — TESTOSTERONE CYPIONATE 200 MG/ML IM SOLN
200.0000 mg | Freq: Once | INTRAMUSCULAR | Status: AC
  Administered 2019-09-23: 200 mg via INTRAMUSCULAR

## 2019-09-23 NOTE — Progress Notes (Signed)
Patient is here for a NV for his Testosterone Cypionate 200 mg/ml IM LEFTupper outer quadrant. Patient tolerated well and will return in 2 weeks for next injection. Vitals taken and recorded 

## 2019-10-07 NOTE — Progress Notes (Signed)
3 Month Follow Up  Assessment and Plan  Essential hypertension  DASH diet, exercise and monitor at home. Call if greater than 130/80.  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  Hyperlipidemia associated with type 2 diabetes mellitus ([place]) Continue Lipitor 20mg  -     Lipid panel Discussed dietary and exercise modifications Walks 5,000 steps daily, continue and work on increasing.  Controlled type 2 diabetes mellitus with stage 3 chronic kidney disease, with long-term current use of insulin ([place]) Discussed general issues about diabetes pathophysiology and management.,  Educational material distributed.,  Suggested low cholesterol diet.,  Encouraged aerobic exercise.,  Discussed foot care,  Reminded to get yearly retinal exam.  Insulin-requiring or dependent type II diabetes mellitus ([place]) -     Hemoglobin A1c Discussed dietary and exercise modification  CKD stage 2 due to type 2 diabetes mellitus (HCC) Increase fluids, avoid NSAIDS, monitor sugars, will monitor -     Hemoglobin A1c Increase fluids, avoid NSAIDS, monitor sugars, will monitor  Morbid obesity (HCC) - follow up 3 months for progress monitoring - increase veggies, decrease carbs - long discussion about weight loss, diet, and exercise -Encourage healthy behaviors  Poor compliance with diet Discussed dietary and exercise modifications at length Discussed effect on glucose and over all health & immunity  [name] -     VITAMIN D 25 Hydroxy (Vit-D Deficiency)  Continue supplementation  Testosterone deficiency -     Testosterone Improvement in  overall well being  Obstructive sleep apnea Sleep apnea-needs to wear nightly Continue CPAP, discussed benefits Helping with daytime fatigue Discussed dietary and exercise modifications for weight loss  Medication management -Continued  Discussed med's effects and SE's. Screening labs and tests as requested with regular follow-up as recommended. Over 30 minutes of interview, exam, counseling, chart review and critical decision making was performed Future Appointments  Date Time Provider [place]  10/26/2019  8:45 AM [facility] NURSE [facility] None  03/23/2020  9:00 AM Vicie Mutters, PA-C [facility] None    HPI Patient presents for a three month follow up.  Reports overall he is doing well.  He has no health or medication concerns today.  He has history of HTN. HLD, DMII using insulin with CKD, ED, OSA on CPAP, [name] and weight.  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  His blood pressure has been controlled at home, today their BP is BP: 122/80 He does not workout but tried to get 5000 steps a day.  He denies chest pain, shortness of breath, dizziness.   He has been working on diet and exercise for diabetes  with diabetic chronic kidney disease he is on ACE/ARB Hyperlipidemia not at goal, on zetia and lipitor  he is on bASA every other day due to history of retroperitoneal hematoma   he is on 70/30 insulin he is on 17 units in am and 15 units with evening meal.  metformin 500mg , two tablets twice a day he has continuous glucose monitor which is helping him to better control his glucose.   He has tried to improve eating at lunch time.  Denies any low sugars, states low is 140-150 and high is 170 and fasting glucose ranges around 160-170  denies paresthesia of the feet, polydipsia, polyuria and visual disturbances.  Eye exam: needs this year Last A1C was:  Lab Results  Component Value  Date   HGBA1C 7.9 (H) 06/30/2019   Lab Results  Component Value Date   CHOL 94 06/30/2019   HDL 25 (L) 06/30/2019   LDLCALC 42 06/30/2019   TRIG 198 (H) 06/30/2019   CHOLHDL 3.8 06/30/2019   Lab Results  Component Value Date   GFRAA 65 06/30/2019   Patient is on Vitamin D supplement.   Lab Results  Component Value Date   VD25OH 86 06/30/2019   BMI is Body mass index is 37.02 kg/m., he is working on diet and exercise. Wt Readings from Last 3 Encounters:  10/09/19 258 lb (117 kg)  09/08/19 256 lb (116.1 kg)  08/11/19 261 lb (118.4 kg)   He has a history of testosterone deficiency and is on testosterone replacement. He states that the testosterone helps with his energy, libido, muscle mass. Received injection today, defer lab. Lab Results  Component Value Date   TESTOSTERONE 959 (H) 03/23/2019     Current Medications:   Current Outpatient Medications (Endocrine & Metabolic):  .  metFORMIN (GLUCOPHAGE-XR) 500 MG 24 hr tablet, Take 2 tablets 2 x /day w/meals for Diabetes .  NOVOLIN 70/30 (70-30) 100 UNIT/ML injection, INJECT UP TO 30 UNITS IN MORNING WITH FOOD & UP TO 30 UNITS WITH EVENING MEAL,ADJUST TO SUGARS .  testosterone cypionate (DEPOTESTOSTERONE CYPIONATE) 200 MG/ML injection, Inject 2 ml into Muscle every 2 weeks  Current Facility-Administered Medications (Endocrine & Metabolic):  .  testosterone cypionate (DEPOTESTOSTERONE CYPIONATE) injection 200 mg  Current Outpatient Medications (Cardiovascular):  .  amLODipine (NORVASC) 10 MG tablet, TAKE 1 TABLET BY MOUTH EVERY DAY .  atorvastatin (LIPITOR) 20 MG tablet, TAKE 1 TABLET BY MOUTH EVERY DAY .  bisoprolol (ZEBETA) 10 MG tablet, Take 1 tablet Daily for BP .  ezetimibe (ZETIA) 10 MG tablet, TAKE 1 TABLET BY MOUTH EVERY DAY .  tadalafil (CIALIS) 20 MG tablet, Take 1/2 to 1 tablet every 2-3 days as needed (Patient taking differently: Take 10-20 mg by mouth daily as needed for erectile dysfunction. Take 1/2 to 1  tablet every 2-3 days as needed) .  telmisartan (MICARDIS) 40 MG tablet, TAKE 1 TABLET BY MOUTH EVERY DAY   Current Outpatient Medications (Respiratory):  .  ciclesonide (OMNARIS) 50 MCG/ACT nasal spray, Place 2 sprays into both nostrils daily as needed for allergies.        Current Outpatient Medications (Other):  .  clotrimazole-betamethasone (LOTRISONE) cream, APPLY 3 TIMES A DAY AS NEEDED .  Continuous Blood Gluc Sensor (FREESTYLE LIBRE 14 DAY SENSOR) MISC, USE AS DIRECTED .  Continuous Blood Gluc Sensor (FREESTYLE LIBRE SENSOR SYSTEM) MISC, 1 device with sensors DX Z79.4 insulin dependent diabetes .  Continuous Blood Gluc Sensor MISC, 1 each by Does not apply route as directed. Use as directed every 10 days. May dispense FreeStyle [employer] or similar. .  fluconazole (DIFLUCAN) 150 MG tablet, TAKE 1 TABLET DAILY FOR YEAST INFECTION .  Insulin Syringe-Needle U-100 (BD VEO INSULIN SYRINGE U/F) 31G X 15/64" 0.5 ML MISC, Use 2 x /day for Insulin .  ketoconazole (NIZORAL) 2 % shampoo, APPLY 1 APPLICATION TOPICALLY 2 (TWO) TIMES A WEEK. APPLY & SHAMPOO 2 X /WEEK .  senna-docusate (SENOKOT-S) 8.6-50 MG tablet, Take by mouth. Marland Kitchen  Vitamin D, Ergocalciferol, (DRISDOL) 1.25 MG (50000 UT) CAPS capsule, TAKE ONE CAPSULE 2 DAYS A WEEK .  glucose blood (FREESTYLE LITE) test strip, Check blood sugar 3 times a day or as directed. [id number]   Allergies:  Allergies  Allergen Reactions  . Ppd [Tuberculin Purified Protein Derivative] Other (See Comments)    POSITIVE REACTION IN PAST 11/21/11 CXR NEG   Medical History:  has Nonallergic rhinitis; Obstructive sleep apnea-questioned; Hypertension; Hyperlipidemia associated with type 2 diabetes mellitus ([place]); Type 2 diabetes mellitus, controlled, with renal complications ([place]); [name]; Testosterone deficiency; Morbid obesity ([place]); Poor compliance with diet; Medication management; Diabetes mellitus with circulatory disorder causing  erectile dysfunction ([place]); Insulin-requiring or dependent type II diabetes mellitus ([place]); and CKD stage 2 due to type 2 diabetes mellitus ([place]) on their problem list.   Surgical History:  He  has a past surgical history that includes Wisdom tooth extraction; Hernia repair; Colonoscopy; and Rotator cuff repair (Left, 2017 sept 5).   Family History:  His family history includes Cancer (age of onset: 70) in his father; Diabetes in his brother, father, and mother; Hyperlipidemia in his father and mother; Hypertension in his brother, father, and mother.   Social History:   reports that he quit smoking about 18 years ago. His smoking use included cigarettes. He has a 2.40 pack-year smoking history. He has never used smokeless tobacco. He reports current alcohol use. He reports that he does not use drugs.   Review of Systems:  Review of Systems  Constitutional: Negative.   HENT: Negative.   Eyes: Negative.   Respiratory: Negative.   Cardiovascular: Negative.   Gastrointestinal: Negative.   Genitourinary: Negative.   Musculoskeletal: Negative.   Skin: Negative.     Physical Exam: Estimated body mass index is 37.02 kg/m as calculated from the following:   Height as of this encounter: 5\' 10"  (1.778 m).   Weight as of this encounter: 258 lb (117 kg). BP 122/80   Pulse 72   Temp (!) 97.5 F (36.4 C)   Ht 5\' 10"  (1.778 m)   Wt 258 lb (117 kg)   SpO2 96%   BMI 37.02 kg/m  General Appearance: Well nourished, in no apparent distress.  Eyes: PERRLA, EOMs, conjunctiva no swelling or erythema, normal fundi and vessels.  Sinuses: No Frontal/maxillary tenderness  ENT/Mouth: Ext aud canals clear, normal light reflex with TMs without erythema, bulging. Good dentition. No erythema, swelling, or exudate on post pharynx. Tonsils not swollen or erythematous. Hearing normal.  Neck: Supple, thyroid normal. No bruits  Respiratory: Respiratory effort normal, BS equal bilaterally without rales, rhonchi,  wheezing or stridor.  Cardio: RRR without murmurs, prominent S2, no rubs or gallops. Brisk peripheral pulses without edema.  Chest: symmetric, with normal excursions and percussion.  Abdomen: Soft, nontender, no guarding, rebound, hernias, masses, or organomegaly.  Lymphatics: Non tender without lymphadenopathy.  Musculoskeletal: Full ROM all peripheral extremities,5/5 strength, and normal gait.  Skin: Warm, dry without rashes, lesions, ecchymosis. Neuro: Cranial nerves intact, reflexes equal bilaterally. Normal muscle tone, no cerebellar symptoms. Sensation intact.  Psych: Awake and oriented X 3, normal affect, Insight and Judgment appropriate.    Vicie Mutters 10:00 AM [facility]

## 2019-10-09 ENCOUNTER — Other Ambulatory Visit: Payer: Self-pay

## 2019-10-09 ENCOUNTER — Ambulatory Visit (INDEPENDENT_AMBULATORY_CARE_PROVIDER_SITE_OTHER): Payer: BC Managed Care – PPO | Admitting: Physician Assistant

## 2019-10-09 ENCOUNTER — Encounter: Payer: Self-pay | Admitting: Physician Assistant

## 2019-10-09 VITALS — BP 122/80 | HR 72 | Temp 97.5°F | Ht 70.0 in | Wt 258.0 lb

## 2019-10-09 DIAGNOSIS — I10 Essential (primary) hypertension: Secondary | ICD-10-CM | POA: Diagnosis not present

## 2019-10-09 DIAGNOSIS — E119 Type 2 diabetes mellitus without complications: Secondary | ICD-10-CM | POA: Diagnosis not present

## 2019-10-09 DIAGNOSIS — E559 Vitamin D deficiency, unspecified: Secondary | ICD-10-CM | POA: Diagnosis not present

## 2019-10-09 DIAGNOSIS — E291 Testicular hypofunction: Secondary | ICD-10-CM

## 2019-10-09 DIAGNOSIS — E785 Hyperlipidemia, unspecified: Secondary | ICD-10-CM | POA: Diagnosis not present

## 2019-10-09 DIAGNOSIS — E1169 Type 2 diabetes mellitus with other specified complication: Secondary | ICD-10-CM | POA: Diagnosis not present

## 2019-10-09 DIAGNOSIS — E1122 Type 2 diabetes mellitus with diabetic chronic kidney disease: Secondary | ICD-10-CM

## 2019-10-09 DIAGNOSIS — N183 Chronic kidney disease, stage 3 unspecified: Secondary | ICD-10-CM

## 2019-10-09 DIAGNOSIS — E1159 Type 2 diabetes mellitus with other circulatory complications: Secondary | ICD-10-CM | POA: Diagnosis not present

## 2019-10-09 DIAGNOSIS — E349 Endocrine disorder, unspecified: Secondary | ICD-10-CM

## 2019-10-09 DIAGNOSIS — Z79899 Other long term (current) drug therapy: Secondary | ICD-10-CM

## 2019-10-09 DIAGNOSIS — Z794 Long term (current) use of insulin: Secondary | ICD-10-CM

## 2019-10-09 DIAGNOSIS — N182 Chronic kidney disease, stage 2 (mild): Secondary | ICD-10-CM

## 2019-10-09 DIAGNOSIS — N521 Erectile dysfunction due to diseases classified elsewhere: Secondary | ICD-10-CM

## 2019-10-09 MED ORDER — TESTOSTERONE CYPIONATE 200 MG/ML IM SOLN
200.0000 mg | Freq: Once | INTRAMUSCULAR | Status: AC
  Administered 2019-10-09: 200 mg via INTRAMUSCULAR

## 2019-10-09 NOTE — Patient Instructions (Signed)
Please check your sugars at home: check it in in the morning before food Check it before your dinner And for a while check it before bed while we are adjusting your medications  IT IS VERY IMPORTANT TO BRING THIS LOG IN to your office visits.  Please take at least 5-8 units in the morning unless your sugar is below 100   We will likely need to increase this but we will do this slowly because a low sugar is much more dangerous than a high sugar.    Your brain needs 2 things, oxygen and sugar, so lets make sure it gets both.  Please remember only take the insulin WITH food   if you are sick or unable to eat DO NOT take your insulin   If at any time you have a question or concern, call the office or message [place] in [place].   Is your insulin absorbing properly?   Of all the factors that seem to impact our blood glucose on a daily basis, insulin absorption rates can be one of the trickiest to manage. Most of [place] know that insulin is absorbed at different rates depending on where it is injected (or where the infusion set is placed if you're pumping). The stomach is generally the area that provides the fastest absorption, followed by the upper arms, thighs, and upper buttocks. This is general knowledge for most of [place], and if the list were really that simple, it would be fairly straightforward. But this is diabetes - it's never straightforward!!  There are a host of other factors that affect how quickly insulin is absorbed into our system, and many of them can be almost impossible to detect (but some planning and educated guessing can help). Nevertheless, we've got to make our best effort to take these factors into consideration when we manage our blood glucose, because the effects of absorption are very real, and can lead to maddening high or low blood glucose readings that seem to "come out of nowhere."  Let's start by listing what exactly impacts absorption:  1. Site selection. As noted above,  the abdomen is generally the "fastest" site for insulin to absorb, followed by[1] the backs of the upper arms, outer sides of the thighs, and the upper buttocks or hips.  2. Subcutaneous tissue (connective tissue and fat) versus muscle. Insulin should be injected into the subcutaneous tissue layer just under the skin. Our insulin needles are designed to inject insulin into this layer. If insulin is injected into muscle, it will absorb significantly faster. This is why some of [place] pinch up our skin when we inject - it helps ensure that we're not injecting into muscle, but into the fatty layer we're supposed to inject into.  3. Too MUCH subcutaneous tissue (come on, diabetes!!!!). In another maddening little diabetes quirk, insulin can also be thrown off when we inject into a layer of fat that is DEEPER than ideal. Remember how the abdomen was supposedly the quickest route? Well, if you inject in the front of the abdomen, and happen to have a little bit of extra padding there (as I certainly do - it's on my list of things to take care of), that will slow down the absorption. So you want to get into the subcutaneous tissue, but you don't want to inject into a thick layer of it. See?!? Maddening!  4. Scar tissue. Oh, scar tissue, how I hate you!! Scar tissue can wreak havoc with your absorption. Think about  this for a minute - if you take multiple daily injections, you are "stabbing" yourself 4-5 times every day! Yes, it's a tiny needle that you barely feel (usually - sometimes you get a "stinger"), but it is still an "injury" as far as the body is concerned. And the body responds by building scar tissue. Scar tissue is your body's version of an iron gate meant to protect the injured area. Over time, scar tissue can build up, and entire areas of your body can become basically "unusable" for injections or infusion sites. Scar tissue will significantly derail absorption. I have had times when I know I've hit scar  tissue, and a shot of insulin will have no effect for two hours, shooting my blood glucose into the stratosphere. I take my correction dose and it starts to slowly fall back to earth. But then, seemingly out of the blue, the initial insulin FINALLY makes its way through, and I go low as the correction dose and the initial dose from hours ago hit my system simultaneously!  5. Temperature. As noted on this page from [place], heat, such as from a hot bath or sauna, can open up blood vessels, increasing the insulin absorption rate, while cold can decrease the absorption rate.  6. Activity level. Your level of physical activity impacts how quickly insulin is absorbed. I've heard of people who will always go for a half-hour walk after a high-carb meal, because they know that it will help speed up the insulin absorption and head off an unwanted high peak in glucose level. Physical activity is kind of a "double-impact," actually, as it causes your body to burn through the sugar more quickly AND speeds up the absorption of insulin. As we all know, exercise pushes [place] low faster than just about anything else we can do.     Diabetes or even increased sugars put you at 300% increased risk of heart attack and stroke.  ALSO BEING DIABETIC YOU MAY NOT HAVE ANY PAIN WITH A HEART ATTACK.  Even worse of a chance of no pain if you are a woman.  It is very unlikely that you will have any pain with a heart attack. Likely your symptoms will be very subtle, even for very severe disease.  Your symptoms for a heart attack will likely occur when you exert your self or exercise and include: Shortness of breath Sweating Nausea Dizziness Fast or irregular heart beats Fatigue   It makes me feel better if my diabetics get their heart rate up with exercise once or twice a week and pay close attention to your body. If there is ANY change in your exercise capacity or if you have symptoms above, please STOP and call 911 or call to come  to the office.   PLEASE REMEMBER:  Diabetes is preventable! Up to 64 percent of complications and morbidities among individuals with type 2 diabetes can be prevented, delayed, or effectively treated and minimized with regular visits to a health professional, appropriate monitoring and medication, and a healthy diet and lifestyle.   Here is some information to help you keep your heart healthy: Move it! - Aim for 30 mins of activity every day. Take it slowly at first. Talk to [place] before starting any new exercise program.   Lose it.  -Body Mass Index (BMI) can indicate if you need to lose weight. A healthy range is 18.5-24.9. For a BMI calculator, go to [email or web address]  Waist Management -Excess abdominal fat is a risk factor  for heart disease, diabetes, asthma, stroke and more. Ideal waist circumference is less than 35" for women and less than 40" for men.   Eat Right -focus on fruits, vegetables, whole grains, and meals you make yourself. Avoid foods with trans fat and high sugar/sodium content.   Snooze or Snore? - Loud snoring can be a sign of sleep apnea, a significant risk factor for high blood pressure, heart attach, stroke, and heart arrhythmias.  Kick the habit -Quit Smoking! Avoid second hand smoke. A single cigarette raises your blood pressure for 20 mins and increases the risk of heart attack and stroke for the next 24 hours.   Are Aspirin and Supplements right for you? -Add ENTERIC COATED low dose 81 mg Aspirin daily OR can do every other day if you have easy bruising to protect your heart and head. As well as to reduce risk of Colon Cancer by 20 %, Skin Cancer by 26 % , Melanoma by 46% and Pancreatic cancer by 60%  Say "No to Stress -There may be little you can do about problems that cause stress. However, techniques such as long walks, meditation, and exercise can help you manage it.   Start Now! - Make changes one at a time and set reasonable goals to increase your  likelihood of success.     General eating tips  What to Avoid . Avoid added sugars o Often added sugar can be found in processed foods such as many condiments, dry cereals, cakes, cookies, chips, crisps, crackers, candies, sweetened drinks, etc.  o Read labels and AVOID/DECREASE use of foods with the following in their ingredient list: Sugar, fructose, high fructose corn syrup, sucrose, glucose, maltose, dextrose, molasses, cane sugar, brown sugar, any type of syrup, agave nectar, etc.   . Avoid snacking in between meals- drink water or if you feel you need a snack, pick a high water content snack such as cucumbers, watermelon, or any veggie.  Marland Kitchen Avoid foods made with flour o If you are going to eat food made with flour, choose those made with whole-grains; and, minimize your consumption as much as is tolerable . Avoid processed foods o These foods are generally stocked in the middle of the grocery store.  o Focus on shopping on the perimeter of the grocery.  What to Include . Vegetables o GREEN LEAFY VEGETABLES: Kale, spinach, mustard greens, collard greens, cabbage, broccoli, etc. o OTHER: Asparagus, cauliflower, eggplant, carrots, peas, Brussel sprouts, tomatoes, bell peppers, zucchini, beets, cucumbers, etc. . Grains, seeds, and legumes o Beans: kidney beans, black eyed peas, garbanzo beans, black beans, pinto beans, etc. o Whole, unrefined grains: brown rice, barley, bulgur, oatmeal, etc. . Healthy fats  o Avoid highly processed fats such as vegetable oil o Examples of healthy fats: avocado, olives, virgin olive oil, dark chocolate (?72% Cocoa), nuts (peanuts, almonds, walnuts, cashews, pecans, etc.) o Please still do small amount of these healthy fats, they are dense in calories.  . Low - Moderate Intake of Animal Sources of Protein o Meat sources: chicken, [place], salmon, tuna. Limit to 4 ounces of meat at one time or the size of your palm. o Consider limiting dairy sources, but  when choosing dairy focus on: PLAIN [place] yogurt, cottage cheese, high-protein milk . Fruit o Choose berries

## 2019-10-09 NOTE — Progress Notes (Signed)
Patient presents to the office for his Testosterone Cypionate 200 mg/ml IMRIGHTupper outer quadrant. Patient tolerated well and will return in 2 weeks for next injection.

## 2019-10-10 LAB — COMPLETE METABOLIC PANEL WITH GFR
AG Ratio: 1.6 (calc) (ref 1.0–2.5)
ALT: 20 U/L (ref 9–46)
AST: 15 U/L (ref 10–35)
Albumin: 4.4 g/dL (ref 3.6–5.1)
Alkaline phosphatase (APISO): 58 U/L (ref 35–144)
BUN: 23 mg/dL (ref 7–25)
CO2: 26 mmol/L (ref 20–32)
Calcium: 10.1 mg/dL (ref 8.6–10.3)
Chloride: 99 mmol/L (ref 98–110)
Creat: 1.3 mg/dL (ref 0.70–1.33)
GFR, Est African American: 70 mL/min/{1.73_m2} (ref >=60)
GFR, Est Non African American: 61 mL/min/{1.73_m2} (ref >=60)
Globulin: 2.8 g/dL (calc) (ref 1.9–3.7)
Glucose, Bld: 165 mg/dL — ABNORMAL HIGH (ref 65–99)
Potassium: 4.6 mmol/L (ref 3.5–5.3)
Sodium: 137 mmol/L (ref 135–146)
Total Bilirubin: 0.9 mg/dL (ref 0.2–1.2)
Total Protein: 7.2 g/dL (ref 6.1–8.1)

## 2019-10-10 LAB — LIPID PANEL
Cholesterol: 135 mg/dL (ref <200)
HDL: 36 mg/dL — ABNORMAL LOW (ref >=40)
LDL Cholesterol (Calc): 82 mg/dL (calc)
Non-HDL Cholesterol (Calc): 99 mg/dL (calc) (ref <130)
Total CHOL/HDL Ratio: 3.8 (calc) (ref <5.0)
Triglycerides: 85 mg/dL (ref <150)

## 2019-10-10 LAB — CBC WITH DIFFERENTIAL/PLATELET
Absolute Monocytes: 793 cells/uL (ref 200–950)
Basophils Absolute: 43 cells/uL (ref 0–200)
Basophils Relative: 0.7 %
Eosinophils Absolute: 342 cells/uL (ref 15–500)
Eosinophils Relative: 5.6 %
HCT: 56.2 % — ABNORMAL HIGH (ref 38.5–50.0)
Hemoglobin: 18.5 g/dL — ABNORMAL HIGH (ref 13.2–17.1)
Lymphs Abs: 1891 cells/uL (ref 850–3900)
MCH: 26.8 pg — ABNORMAL LOW (ref 27.0–33.0)
MCHC: 32.9 g/dL (ref 32.0–36.0)
MCV: 81.3 fL (ref 80.0–100.0)
MPV: 9.5 fL (ref 7.5–12.5)
Monocytes Relative: 13 %
Neutro Abs: 3032 cells/uL (ref 1500–7800)
Neutrophils Relative %: 49.7 %
Platelets: 265 10*3/uL (ref 140–400)
RBC: 6.91 10*6/uL — ABNORMAL HIGH (ref 4.20–5.80)
RDW: 17.3 % — ABNORMAL HIGH (ref 11.0–15.0)
Total Lymphocyte: 31 %
WBC: 6.1 10*3/uL (ref 3.8–10.8)

## 2019-10-10 LAB — HEMOGLOBIN A1C
Hgb A1c MFr Bld: 9.4 % of total Hgb — ABNORMAL HIGH (ref <5.7)
Mean Plasma Glucose: 223 (calc)
eAG (mmol/L): 12.4 (calc)

## 2019-10-10 LAB — TSH: TSH: 1.06 mIU/L (ref 0.40–4.50)

## 2019-10-10 LAB — MAGNESIUM: Magnesium: 2.1 mg/dL (ref 1.5–2.5)

## 2019-10-10 LAB — VITAMIN D 25 HYDROXY (VIT D DEFICIENCY, FRACTURES): Vit D, 25-Hydroxy: 92 ng/mL (ref 30–100)

## 2019-10-26 ENCOUNTER — Other Ambulatory Visit: Payer: Self-pay

## 2019-10-26 ENCOUNTER — Ambulatory Visit (INDEPENDENT_AMBULATORY_CARE_PROVIDER_SITE_OTHER): Payer: BC Managed Care – PPO

## 2019-10-26 VITALS — BP 120/80 | HR 72 | Temp 97.7°F | Wt 262.0 lb

## 2019-10-26 DIAGNOSIS — E349 Endocrine disorder, unspecified: Secondary | ICD-10-CM | POA: Diagnosis not present

## 2019-10-26 MED ORDER — TESTOSTERONE CYPIONATE 200 MG/ML IM SOLN: INTRAMUSCULAR | 2 refills | Status: DC

## 2019-10-26 MED ORDER — TESTOSTERONE CYPIONATE 200 MG/ML IM SOLN
200.0000 mg | Freq: Once | INTRAMUSCULAR | Status: AC
  Administered 2019-10-26: 200 mg via INTRAMUSCULAR

## 2019-10-26 NOTE — Telephone Encounter (Signed)
Refill request for Testosterone. In que for your review.

## 2019-10-26 NOTE — Progress Notes (Signed)
Patient presents to the office for his Testosterone Cypionate 200 mg/ml IM LEFTupper outer quadrant. Patient tolerated well and will return in 2 weeks for next injection

## 2019-11-21 ENCOUNTER — Other Ambulatory Visit: Payer: Self-pay | Admitting: Internal Medicine

## 2019-11-21 ENCOUNTER — Other Ambulatory Visit: Payer: Self-pay | Admitting: Adult Health

## 2019-11-21 DIAGNOSIS — I10 Essential (primary) hypertension: Secondary | ICD-10-CM

## 2019-11-23 ENCOUNTER — Other Ambulatory Visit: Payer: Self-pay

## 2019-11-23 DIAGNOSIS — Z20822 Contact with and (suspected) exposure to covid-19: Secondary | ICD-10-CM

## 2019-11-24 ENCOUNTER — Other Ambulatory Visit: Payer: Self-pay | Admitting: Internal Medicine

## 2019-11-24 DIAGNOSIS — E1122 Type 2 diabetes mellitus with diabetic chronic kidney disease: Secondary | ICD-10-CM

## 2019-11-24 DIAGNOSIS — Z794 Long term (current) use of insulin: Secondary | ICD-10-CM

## 2019-11-24 DIAGNOSIS — N182 Chronic kidney disease, stage 2 (mild): Secondary | ICD-10-CM

## 2019-11-25 LAB — NOVEL CORONAVIRUS, NAA: SARS-CoV-2, NAA: NOT DETECTED

## 2019-11-26 ENCOUNTER — Other Ambulatory Visit: Payer: Self-pay | Admitting: Internal Medicine

## 2019-12-16 ENCOUNTER — Other Ambulatory Visit: Payer: Self-pay | Admitting: Adult Health

## 2019-12-16 ENCOUNTER — Other Ambulatory Visit: Payer: Self-pay | Admitting: Internal Medicine

## 2020-01-18 ENCOUNTER — Other Ambulatory Visit: Payer: Self-pay | Admitting: Nephrology

## 2020-01-18 DIAGNOSIS — S37019A Minor contusion of unspecified kidney, initial encounter: Secondary | ICD-10-CM

## 2020-01-25 ENCOUNTER — Other Ambulatory Visit: Payer: Self-pay | Admitting: Physician Assistant

## 2020-01-25 DIAGNOSIS — I10 Essential (primary) hypertension: Secondary | ICD-10-CM

## 2020-01-25 DIAGNOSIS — Z794 Long term (current) use of insulin: Secondary | ICD-10-CM

## 2020-01-25 DIAGNOSIS — E1122 Type 2 diabetes mellitus with diabetic chronic kidney disease: Secondary | ICD-10-CM

## 2020-01-25 DIAGNOSIS — N521 Erectile dysfunction due to diseases classified elsewhere: Secondary | ICD-10-CM

## 2020-01-25 DIAGNOSIS — N182 Chronic kidney disease, stage 2 (mild): Secondary | ICD-10-CM

## 2020-01-25 DIAGNOSIS — E119 Type 2 diabetes mellitus without complications: Secondary | ICD-10-CM

## 2020-01-25 DIAGNOSIS — N183 Chronic kidney disease, stage 3 unspecified: Secondary | ICD-10-CM

## 2020-01-25 DIAGNOSIS — E1159 Type 2 diabetes mellitus with other circulatory complications: Secondary | ICD-10-CM

## 2020-01-28 ENCOUNTER — Ambulatory Visit: Payer: BC Managed Care – PPO

## 2020-02-01 ENCOUNTER — Ambulatory Visit: Payer: BC Managed Care – PPO | Attending: Internal Medicine

## 2020-02-01 DIAGNOSIS — Z23 Encounter for immunization: Secondary | ICD-10-CM | POA: Insufficient documentation

## 2020-02-01 NOTE — Progress Notes (Signed)
   Covid-19 Vaccination Clinic  Name:  Russell [name]    MRN: [id number] DOB: 06-18-1961  02/01/2020  Mr. [name] was observed post Covid-19 immunization for 15 minutes without incidence. He was provided with Vaccine Information Sheet and instruction to access the V-Safe system.   Mr. [name] was instructed to call 911 with any severe reactions post vaccine: Marland Kitchen Difficulty breathing  . Swelling of your face and throat  . A fast heartbeat  . A bad rash all over your body  . Dizziness and weakness    Immunizations Administered    Name Date Dose VIS Date Route   Moderna COVID-19 Vaccine 02/01/2020 11:06 AM 0.5 mL 11/10/2019 Intramuscular   Manufacturer: Moderna   Lot: [id number]   [id number]

## 2020-02-08 ENCOUNTER — Other Ambulatory Visit: Payer: Self-pay

## 2020-02-08 MED ORDER — "BD VEO INSULIN SYRINGE U/F 31G X 15/64"" 0.5 ML MISC": 3 refills | Status: DC

## 2020-02-08 NOTE — Telephone Encounter (Signed)
INSULIN NEEDLES WERE REFILL & comment dispense insurance preference

## 2020-02-11 ENCOUNTER — Other Ambulatory Visit: Payer: Self-pay

## 2020-02-11 ENCOUNTER — Ambulatory Visit
Admission: RE | Admit: 2020-02-11 | Discharge: 2020-02-11 | Disposition: A | Discharge status: Home / Self Care | Payer: BC Managed Care – PPO | Source: Ambulatory Visit | Attending: Nephrology | Admitting: Nephrology

## 2020-02-11 DIAGNOSIS — S37019A Minor contusion of unspecified kidney, initial encounter: Secondary | ICD-10-CM

## 2020-02-11 DIAGNOSIS — S37092A Other injury of left kidney, initial encounter: Secondary | ICD-10-CM | POA: Diagnosis not present

## 2020-02-11 MED ORDER — GADOBENATE DIMEGLUMINE 529 MG/ML IV SOLN
20.0000 mL | Freq: Once | INTRAVENOUS | Status: AC | PRN
  Administered 2020-02-11: 20 mL via INTRAVENOUS

## 2020-02-15 ENCOUNTER — Other Ambulatory Visit: Payer: Self-pay

## 2020-02-15 MED ORDER — FREESTYLE LIBRE 14 DAY SENSOR MISC: 3 refills | Status: DC

## 2020-03-01 ENCOUNTER — Ambulatory Visit: Payer: BC Managed Care – PPO | Attending: Family

## 2020-03-01 DIAGNOSIS — Z23 Encounter for immunization: Secondary | ICD-10-CM

## 2020-03-01 NOTE — Progress Notes (Signed)
   Covid-19 Vaccination Clinic  Name:  [name]    MRN: [id number] DOB: 03-13-1961  03/01/2020  Mr. Russell [name] was observed post Covid-19 immunization for 15 minutes without incident. He was provided with Vaccine Information Sheet and instruction to access the V-Safe system.   Mr. Russell [name] was instructed to call 911 with any severe reactions post vaccine: Marland Kitchen Difficulty breathing  . Swelling of face and throat  . A fast heartbeat  . A bad rash all over body  . Dizziness and weakness   Immunizations Administered    Name Date Dose VIS Date Route   Moderna COVID-19 Vaccine 03/01/2020  1:31 PM 0.5 mL 11/10/2019 Intramuscular   Manufacturer: Moderna   [id number]   [id number]

## 2020-03-21 NOTE — Progress Notes (Deleted)
Complete Physical  Assessment and Plan:  Essential hypertension - continue medications, DASH diet, exercise and monitor at home. Call if greater than 130/80.  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  Hyperlipidemia associated with type 2 diabetes mellitus ([place]) -     Lipid panel check lipids decrease fatty foods increase activity.   Controlled type 2 diabetes mellitus with stage 3 chronic kidney disease, with long-term current use of insulin ([place]) Discussed general issues about diabetes pathophysiology and management., Educational material distributed., Suggested low cholesterol diet., Encouraged aerobic exercise., Discussed foot care., Reminded to get yearly retinal exam.  Insulin-requiring or dependent type II diabetes mellitus ([place]) -     Hemoglobin A1c Patient admits to not eating well, will try to be stricter.   CKD stage 2 due to type 2 diabetes mellitus (HCC) -     Hemoglobin A1c Increase fluids, avoid NSAIDS, monitor sugars, will monitor  Morbid obesity (HCC) - follow up 3 months for progress monitoring - increase veggies, decrease carbs - long discussion about weight loss, diet, and exercise  Poor compliance with diet LONG DISCUSSION THAT HAVING HIS SUGARS UNCONTROLLED DECREASES IMMUNE SYSTEM  Medication management -     Magnesium  [name] -     VITAMIN D 25 Hydroxy (Vit-D Deficiency, Fractures) -     Vitamin D, Ergocalciferol, (DRISDOL) 1.25 MG (50000 UT) CAPS capsule;  TAKE ONE CAPSULE EVERY DAY OR AS DIRECTED  Testosterone deficiency -     Testosterone -     testosterone cypionate (DEPOTESTOSTERONE CYPIONATE) injection 200 mg  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   Encounter for general adult medical examination with abnormal findings 1 YEAR  Screening, anemia, deficiency, iron -     Iron,Total/Total Iron Binding Cap -     Ferritin -     Vitamin B12  Screening PSA (prostate specific antigen) -     PSA   Discussed med's effects and SE's. Screening labs and tests as requested with regular follow-up as recommended. Over 40 minutes of exam, counseling, chart review and critical decision making was performed  HPI Patient presents for a complete physical.   Father in [place] had left nephrectomy due to renal cancer, he was there to care give for him and has gotten out of his routine. Dad is doing better, he is 72.   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   His blood pressure has been controlled at home, today their BP is   He does not workout but tried to get 5000 steps a day.  He denies chest pain, shortness of breath, dizziness.   BMI is There is no height or weight on file to calculate BMI., he is working on diet and exercise. Wt Readings from Last 3 Encounters:  10/26/19 262 lb (118.8 kg)  10/09/19 258 lb (117 kg)  09/08/19 256 lb (116.1 kg)  He has been working on diet and exercise for Diabetes  with diabetic chronic kidney disease he is on ACE/ARB Hyperlipidemia not at goal, on zetia and lipitor  he is on bASA every other day due to history of retroperitoneal hematoma   he is on 70/30 insulin he is on 17 units in am and 15 units with evening meal.  metformin 500mg , two tablets twice a day he has continuous glucose monitor which is helping him to better control his glucose.   He has tried to improve  eating at lunch time.   Denies any low sugars, states low is 140-150 and high is 170 and fasting glucose ranges around 160-170  denies paresthesia of the feet, polydipsia, polyuria and visual disturbances.  Eye exam: needs this year  Last A1C was:  Lab Results  Component Value Date   HGBA1C 9.4 (H) 10/09/2019   Last GFR: Lab Results  Component Value Date   GFRAA 70 10/09/2019   Lab Results  Component Value Date   CHOL 135 10/09/2019   HDL 36 (L) 10/09/2019   LDLCALC 82 10/09/2019   TRIG 85 10/09/2019   CHOLHDL 3.8 10/09/2019    Patient is on Vitamin D supplement.   Lab Results  Component Value Date   VD25OH 92 10/09/2019     Last PSA was: Lab Results  Component Value Date   PSA 2.4 03/23/2019   He has a history of testosterone deficiency and is on testosterone replacement. He states that the testosterone helps with his energy, libido, muscle mass. GOT injection today Lab Results  Component Value Date   TESTOSTERONE 959 (H) 03/23/2019     Current Medications:    Current Outpatient Medications (Endocrine & Metabolic):  .  metFORMIN (GLUCOPHAGE-XR) 500 MG 24 hr tablet, Take 2 tablets 2 x /day with Meals for Diabetes .  NOVOLIN 70/30 (70-30) 100 UNIT/ML injection, INJECT UP TO 30 UNITS IN MORNING WITH FOOD & UP TO 30 UNITS WITH EVENING MEAL,ADJUST TO SUGARS .  testosterone cypionate (DEPOTESTOSTERONE CYPIONATE) 200 MG/ML injection, Inject 2 ml into Muscle every 2 weeks  Current Facility-Administered Medications (Endocrine & Metabolic):  .  testosterone cypionate (DEPOTESTOSTERONE CYPIONATE) injection 200 mg  Current Outpatient Medications (Cardiovascular):  .  amLODipine (NORVASC) 10 MG tablet, Take 1 tablet Daily for BP .  atorvastatin (LIPITOR) 20 MG tablet, Take 1 tablet Daily for Cholesterol .  bisoprolol (ZEBETA) 10 MG tablet, Take 1 tablet Daily for BP .  ezetimibe (ZETIA) 10 MG tablet, TAKE 1 TABLET BY MOUTH EVERY DAY .  tadalafil (CIALIS) 20 MG tablet, Take  1/2 to 1 tablet every 2-3 days as needed (Patient taking differently: Take 10-20 mg by mouth daily as needed for erectile dysfunction. Take 1/2 to 1 tablet every 2-3 days as needed) .  telmisartan (MICARDIS) 40 MG tablet, TAKE 1 TABLET BY MOUTH EVERY DAY   Current Outpatient Medications (Respiratory):  .  ciclesonide (OMNARIS) 50 MCG/ACT nasal spray, Place 2 sprays into both nostrils daily as needed for allergies.        Current Outpatient Medications (Other):  .  clotrimazole-betamethasone (LOTRISONE) cream, APPLY 3 TIMES A DAY AS NEEDED .  Continuous Blood Gluc Sensor (FREESTYLE LIBRE 14 DAY SENSOR) MISC, USE AS DIRECTED .  Continuous Blood Gluc Sensor (FREESTYLE LIBRE SENSOR SYSTEM) MISC, 1 device with sensors DX Z79.4 insulin dependent diabetes .  Continuous Blood Gluc Sensor MISC, 1 each by Does not apply route as directed. Use as directed every 10 days. May dispense  FreeStyle [employer] or similar. .  fluconazole (DIFLUCAN) 150 MG tablet, TAKE 1 TABLET DAILY FOR YEAST INFECTION .  glucose blood (FREESTYLE LITE) test strip, Check blood sugar 3 times a day or as directed. DX-E11.22 .  Insulin Syringe-Needle U-100 (BD VEO INSULIN SYRINGE U/F) 31G X 15/64" 0.5 ML MISC, USE 2 X /DAY FOR INSULIN/DISPENSE INSURANCE PREFERENCE DX: E11.22 .  ketoconazole (NIZORAL) 2 % shampoo, APPLY 1 APPLICATION TOPICALLY 2 (TWO) TIMES A WEEK. APPLY & SHAMPOO 2 X /WEEK .  senna-docusate (SENOKOT-S) 8.6-50 MG tablet, Take by mouth. .  Vitamin D, Ergocalciferol, (DRISDOL) 1.25 MG (50000 UT) CAPS capsule, TAKE ONE CAPSULE 2 DAYS A WEEK   Allergies:  Allergies  Allergen Reactions  . Ppd [Tuberculin Purified Protein Derivative] Other (See Comments)    POSITIVE REACTION IN PAST 11/21/11 CXR NEG   Health Maintenance:  Immunization History  Administered Date(s) Administered  . Influenza Split 10/10/2013  . Influenza Whole 09/08/2012  . Influenza-Unspecified 09/22/2018, 09/11/2019, 09/18/2019  .  Moderna SARS-COVID-2 Vaccination 02/01/2020, 03/01/2020  . Pneumococcal-Unspecified 12/31/2011  . Tdap 12/19/2009    Tetanus:2011 Pneumovax: 2003 Prevnar 13: Flu vaccine: 2019 Zostavax:  DEXA: Colonoscopy: Apr 28 2018 EGD: Eye Exam: [place] 2017 Dentist:  DM eye exam 06/2018  Patient Care Team: Unk Pinto, MD as PCP - General (Internal Medicine)  Medical History:  has Nonallergic rhinitis; Obstructive sleep apnea-questioned; Hypertension; Hyperlipidemia associated with type 2 diabetes mellitus ([place]); Type 2 diabetes mellitus, controlled, with renal complications ([place]); [name]; Testosterone deficiency; Morbid obesity ([place]); Poor compliance with diet; Medication management; 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   Surgical History:  He  has a past surgical history that includes Wisdom tooth extraction; Hernia repair; Colonoscopy; and Rotator cuff repair (Left, 2017 sept 5).   Family History:  His family history includes Cancer (age of onset: 76) in his father; Diabetes in his brother, father, and mother; Hyperlipidemia in his father and mother; Hypertension in his brother, father, and mother.   Social History:   reports that he quit smoking about 19 years ago. His smoking use included cigarettes. He has a 2.40 pack-year smoking history. He has never used smokeless tobacco. He reports current alcohol use. He reports that he does not use drugs.   Review of Systems:  Review of Systems  Constitutional: Negative.   HENT: Negative.   Eyes: Negative.   Respiratory: Negative.   Cardiovascular: Negative.   Gastrointestinal: Negative.   Genitourinary: Negative.   Musculoskeletal: Negative.   Skin: Negative.   Neurological: Negative.   Endo/Heme/Allergies: Negative.    Psychiatric/Behavioral: Negative.     Physical Exam: Estimated body mass index is 37.59 kg/m as calculated from the following:   Height as of 10/09/19: 5\' 10"  (1.778 m).   Weight as of 10/26/19: 262 lb (118.8 kg). There were no vitals taken for this visit. General Appearance: Well nourished, in no apparent distress.  Eyes: PERRLA, EOMs, conjunctiva no swelling or erythema, normal fundi and vessels.  Sinuses: No Frontal/maxillary tenderness  ENT/Mouth: Ext aud canals clear, normal light reflex with TMs without erythema, bulging. Good dentition. No erythema, swelling, or exudate on post pharynx. Tonsils not swollen or erythematous. Hearing normal.  Neck: Supple, thyroid normal. No bruits  Respiratory: Respiratory effort normal, BS equal bilaterally without rales, rhonchi, wheezing or stridor.  Cardio: RRR without murmurs,  prominent S2, no rubs or gallops. Brisk peripheral pulses without edema.  Chest: symmetric, with normal excursions and percussion.  Abdomen: Soft, nontender, no guarding, rebound, hernias, masses, or organomegaly.  Lymphatics: Non tender without lymphadenopathy.  Genitourinary: defer Musculoskeletal: Full ROM all peripheral extremities,5/5 strength, and normal gait.  Skin: Warm, dry without rashes, lesions, ecchymosis. Neuro: Cranial nerves intact, reflexes equal bilaterally. Normal muscle tone, no cerebellar symptoms. Sensation intact.  Psych: Awake and oriented X 3, normal affect, Insight and Judgment appropriate.   EKG: WNL, concave ST changes, not different from last year.  AORTA SCAN: N/A  Vicie Mutters 1:46 PM [facility]

## 2020-03-23 ENCOUNTER — Encounter: Payer: BLUE CROSS/BLUE SHIELD | Admitting: Physician Assistant

## 2020-03-27 ENCOUNTER — Other Ambulatory Visit: Payer: Self-pay | Admitting: Physician Assistant

## 2020-03-27 DIAGNOSIS — E559 Vitamin D deficiency, unspecified: Secondary | ICD-10-CM

## 2020-04-08 ENCOUNTER — Ambulatory Visit: Payer: BC Managed Care – PPO | Admitting: Physician Assistant

## 2020-04-08 ENCOUNTER — Other Ambulatory Visit: Payer: Self-pay

## 2020-04-08 ENCOUNTER — Encounter: Payer: Self-pay | Admitting: Physician Assistant

## 2020-04-08 VITALS — BP 126/76 | HR 86 | Temp 97.6°F | Ht 70.0 in | Wt 255.0 lb

## 2020-04-08 DIAGNOSIS — Z136 Encounter for screening for cardiovascular disorders: Secondary | ICD-10-CM | POA: Diagnosis not present

## 2020-04-08 DIAGNOSIS — Z0000 Encounter for general adult medical examination without abnormal findings: Secondary | ICD-10-CM

## 2020-04-08 DIAGNOSIS — E1122 Type 2 diabetes mellitus with diabetic chronic kidney disease: Secondary | ICD-10-CM

## 2020-04-08 DIAGNOSIS — I10 Essential (primary) hypertension: Secondary | ICD-10-CM

## 2020-04-08 DIAGNOSIS — Z1389 Encounter for screening for other disorder: Secondary | ICD-10-CM | POA: Diagnosis not present

## 2020-04-08 DIAGNOSIS — Z91199 Patient's noncompliance with other medical treatment and regimen due to unspecified reason: Secondary | ICD-10-CM

## 2020-04-08 DIAGNOSIS — Z794 Long term (current) use of insulin: Secondary | ICD-10-CM

## 2020-04-08 DIAGNOSIS — I700 Atherosclerosis of aorta: Secondary | ICD-10-CM

## 2020-04-08 DIAGNOSIS — N183 Chronic kidney disease, stage 3 unspecified: Secondary | ICD-10-CM

## 2020-04-08 DIAGNOSIS — Z23 Encounter for immunization: Secondary | ICD-10-CM | POA: Diagnosis not present

## 2020-04-08 DIAGNOSIS — Z0001 Encounter for general adult medical examination with abnormal findings: Secondary | ICD-10-CM

## 2020-04-08 DIAGNOSIS — N401 Enlarged prostate with lower urinary tract symptoms: Secondary | ICD-10-CM | POA: Diagnosis not present

## 2020-04-08 DIAGNOSIS — R350 Frequency of micturition: Secondary | ICD-10-CM | POA: Diagnosis not present

## 2020-04-08 DIAGNOSIS — E291 Testicular hypofunction: Secondary | ICD-10-CM

## 2020-04-08 DIAGNOSIS — Z79899 Other long term (current) drug therapy: Secondary | ICD-10-CM | POA: Diagnosis not present

## 2020-04-08 DIAGNOSIS — Z130 Encounter for screening for diseases of the blood and blood-forming organs and certain disorders involving the immune mechanism: Secondary | ICD-10-CM | POA: Diagnosis not present

## 2020-04-08 DIAGNOSIS — E559 Vitamin D deficiency, unspecified: Secondary | ICD-10-CM | POA: Diagnosis not present

## 2020-04-08 DIAGNOSIS — E119 Type 2 diabetes mellitus without complications: Secondary | ICD-10-CM

## 2020-04-08 DIAGNOSIS — E1169 Type 2 diabetes mellitus with other specified complication: Secondary | ICD-10-CM

## 2020-04-08 DIAGNOSIS — Z9119 Patient's noncompliance with other medical treatment and regimen: Secondary | ICD-10-CM

## 2020-04-08 DIAGNOSIS — G4733 Obstructive sleep apnea (adult) (pediatric): Secondary | ICD-10-CM

## 2020-04-08 DIAGNOSIS — Z13220 Encounter for screening for lipoid disorders: Secondary | ICD-10-CM | POA: Diagnosis not present

## 2020-04-08 DIAGNOSIS — Z131 Encounter for screening for diabetes mellitus: Secondary | ICD-10-CM

## 2020-04-08 DIAGNOSIS — N138 Other obstructive and reflux uropathy: Secondary | ICD-10-CM

## 2020-04-08 DIAGNOSIS — Z1329 Encounter for screening for other suspected endocrine disorder: Secondary | ICD-10-CM | POA: Diagnosis not present

## 2020-04-08 DIAGNOSIS — J310 Chronic rhinitis: Secondary | ICD-10-CM

## 2020-04-08 DIAGNOSIS — N182 Chronic kidney disease, stage 2 (mild): Secondary | ICD-10-CM

## 2020-04-08 DIAGNOSIS — Z125 Encounter for screening for malignant neoplasm of prostate: Secondary | ICD-10-CM | POA: Diagnosis not present

## 2020-04-08 DIAGNOSIS — E1159 Type 2 diabetes mellitus with other circulatory complications: Secondary | ICD-10-CM

## 2020-04-08 DIAGNOSIS — E785 Hyperlipidemia, unspecified: Secondary | ICD-10-CM

## 2020-04-08 DIAGNOSIS — E349 Endocrine disorder, unspecified: Secondary | ICD-10-CM

## 2020-04-08 DIAGNOSIS — N521 Erectile dysfunction due to diseases classified elsewhere: Secondary | ICD-10-CM

## 2020-04-08 DIAGNOSIS — I708 Atherosclerosis of other arteries: Secondary | ICD-10-CM

## 2020-04-08 MED ORDER — TESTOSTERONE CYPIONATE 200 MG/ML IM SOLN
200.0000 mg | INTRAMUSCULAR | Status: DC
  Administered 2020-04-08 – 2020-05-20 (×2): 200 mg via INTRAMUSCULAR

## 2020-04-08 NOTE — Progress Notes (Signed)
Complete Physical  Assessment and Plan:  Essential hypertension - continue medications, DASH diet, exercise and monitor at home. Call if greater than 130/80.  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  Hyperlipidemia associated with type 2 diabetes mellitus ([place]) -     Lipid panel check lipids decrease fatty foods increase activity.   Controlled type 2 diabetes mellitus with stage 3 chronic kidney disease, with long-term current use of insulin ([place]) Discussed general issues about diabetes pathophysiology and management., Educational material distributed., Suggested low cholesterol diet., Encouraged aerobic exercise., Discussed foot care., Reminded to get yearly retinal exam.  Insulin-requiring or dependent type II diabetes mellitus ([place]) -     Hemoglobin A1c Patient admits to not eating well, will try to be stricter.   CKD stage 2 due to type 2 diabetes mellitus (HCC) -     Hemoglobin A1c Increase fluids, avoid NSAIDS, monitor sugars, will monitor  Morbid obesity (HCC) - follow up 3 months for progress monitoring - increase veggies, decrease carbs - long discussion about weight loss, diet, and exercise  Poor compliance with diet LONG DISCUSSION THAT HAVING HIS SUGARS UNCONTROLLED DECREASES IMMUNE SYSTEM  Medication management -     Magnesium  [name] -     VITAMIN D 25 Hydroxy (Vit-D Deficiency, Fractures) -     Vitamin D, Ergocalciferol, (DRISDOL) 1.25 MG (50000 UT) CAPS capsule;  TAKE ONE CAPSULE EVERY DAY OR AS DIRECTED  Testosterone deficiency -     Testosterone -     testosterone cypionate (DEPOTESTOSTERONE CYPIONATE) injection 200 mg  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   BPH with urinary obstruction -     PSA -     Estradiol -     Dihydrotestosterone  Screening, anemia, deficiency, iron -     Iron,Total/Total Iron Binding Cap -     Vitamin B12  Discussed med's effects and SE's. Screening labs and tests as requested with regular follow-up as recommended. Over 40 minutes of exam, counseling, chart review and critical decision making was performed  HPI Patient presents for a complete physical.   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  Father in [place] had left nephrectomy due to renal cancer, he was there to care give for him and has gotten out of his routine. Dad is doing better, he is 30.  He follows with Dr. Clover Mealy, had MRI AB for monitoring of perirenal hematoma that showed atherosclerosis of the aorta/illiac disease.   His blood pressure has been  controlled at home, today their BP is BP: 126/76 He does not workout but tried to get 5000 steps a day.  He denies chest pain, shortness of breath, dizziness.   BMI is Body mass index is 36.59 kg/m., he is working on diet and exercise. Wt Readings from Last 3 Encounters:  04/08/20 255 lb (115.7 kg)  10/26/19 262 lb (118.8 kg)  10/09/19 258 lb (117 kg)    He has been working on  diet and exercise for Diabetes  with diabetic chronic kidney disease he is on ACE/ARB Hyperlipidemia not at goal, on zetia and lipitor  he is on bASA every other day due to history of retroperitoneal hematoma   he is on 70/30 insulin he is on 17 units in am and 15 units with evening meal.  metformin 500mg , two tablets twice a day he has continuous glucose monitor which is helping him to better control his glucose.   He has tried to improve eating at lunch time.   Denies any low sugars, states low is 140-150 and high is 170 and fasting glucose ranges around 160-170  denies paresthesia of the feet, polydipsia, polyuria and visual disturbances.  Eye exam: Dr. Einar Gip 2020, he is due next month- wears glasses Last A1C was:  Lab Results  Component Value Date   HGBA1C 9.4 (H) 10/09/2019   Last GFR: Lab Results  Component Value Date   GFRAA 70 10/09/2019   Lab Results  Component Value Date   CHOL 135 10/09/2019   HDL 36 (L) 10/09/2019   LDLCALC 82 10/09/2019   TRIG 85 10/09/2019   CHOLHDL 3.8 10/09/2019    Patient is on Vitamin D supplement.   Lab Results  Component Value Date   VD25OH 92 10/09/2019     Last PSA was: Lab Results  Component Value Date   PSA 2.4 03/23/2019   He has a history of testosterone deficiency and is on testosterone replacement. He states that the testosterone helps with his energy, libido, muscle mass. GOT injection today Lab Results  Component Value Date   TESTOSTERONE 959 (H) 03/23/2019     Current Medications:    Current Outpatient Medications (Endocrine & Metabolic):  .  metFORMIN (GLUCOPHAGE-XR) 500 MG 24 hr tablet, Take 2 tablets 2 x /day with Meals for Diabetes .  NOVOLIN 70/30 (70-30) 100 UNIT/ML injection, INJECT UP TO 30 UNITS IN MORNING WITH FOOD & UP TO 30 UNITS WITH EVENING MEAL,ADJUST TO SUGARS .  testosterone cypionate (DEPOTESTOSTERONE CYPIONATE) 200 MG/ML injection, Inject 2 ml into Muscle every 2 weeks  Current  Facility-Administered Medications (Endocrine & Metabolic):  .  testosterone cypionate (DEPOTESTOSTERONE CYPIONATE) injection 200 mg .  testosterone cypionate (DEPOTESTOSTERONE CYPIONATE) injection 200 mg  Current Outpatient Medications (Cardiovascular):  .  amLODipine (NORVASC) 10 MG tablet, Take 1 tablet Daily for BP .  atorvastatin (LIPITOR) 20 MG tablet, Take 1 tablet Daily for Cholesterol .  bisoprolol (ZEBETA) 10 MG tablet, Take 1 tablet Daily for BP .  ezetimibe (ZETIA) 10 MG tablet, TAKE 1 TABLET BY MOUTH EVERY DAY .  tadalafil (CIALIS) 20 MG tablet, Take 1/2 to 1 tablet every 2-3 days as needed (Patient taking differently: Take 10-20 mg by mouth daily as needed for erectile dysfunction. Take 1/2 to 1 tablet every 2-3 days as needed) .  telmisartan (MICARDIS) 40 MG tablet, TAKE 1 TABLET BY MOUTH EVERY DAY   Current Outpatient Medications (Respiratory):  .  ciclesonide (OMNARIS) 50 MCG/ACT nasal spray, Place 2 sprays into both  nostrils daily as needed for allergies.        Current Outpatient Medications (Other):  .  clotrimazole-betamethasone (LOTRISONE) cream, APPLY 3 TIMES A DAY AS NEEDED .  Continuous Blood Gluc Sensor (FREESTYLE LIBRE 14 DAY SENSOR) MISC, USE AS DIRECTED .  Continuous Blood Gluc Sensor (FREESTYLE LIBRE SENSOR SYSTEM) MISC, 1 device with sensors DX Z79.4 insulin dependent diabetes .  Continuous Blood Gluc Sensor MISC, 1 each by Does not apply route as directed. Use as directed every 10 days. May dispense FreeStyle [employer] or similar. .  fluconazole (DIFLUCAN) 150 MG tablet, TAKE 1 TABLET DAILY FOR YEAST INFECTION .  glucose blood (FREESTYLE LITE) test strip, Check blood sugar 3 times a day or as directed. DX-E11.22 .  Insulin Syringe-Needle U-100 (BD VEO INSULIN SYRINGE U/F) 31G X 15/64" 0.5 ML MISC, USE 2 X /DAY FOR INSULIN/DISPENSE INSURANCE PREFERENCE DX: E11.22 .  ketoconazole (NIZORAL) 2 % shampoo, APPLY 1 APPLICATION TOPICALLY 2 (TWO) TIMES A  WEEK. APPLY & SHAMPOO 2 X /WEEK .  senna-docusate (SENOKOT-S) 8.6-50 MG tablet, Take by mouth. .  Vitamin D, Ergocalciferol, (DRISDOL) 1.25 MG (50000 UNIT) CAPS capsule, Take 1 capsule 2 x /week on Mon - Thurs   Allergies:  Allergies  Allergen Reactions  . Ppd [Tuberculin Purified Protein Derivative] Other (See Comments)    POSITIVE REACTION IN PAST 11/21/11 CXR NEG   Health Maintenance:  Immunization History  Administered Date(s) Administered  . Influenza Split 10/10/2013  . Influenza Whole 09/08/2012  . Influenza-Unspecified 09/22/2018, 09/11/2019, 09/18/2019  . Moderna SARS-COVID-2 Vaccination 02/01/2020, 03/01/2020  . Pneumococcal-Unspecified 12/31/2011  . 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   Surgical History:  He  has a past surgical history that includes Wisdom tooth extraction; Hernia repair; Colonoscopy; and Rotator cuff repair (Left, 2017 sept 5).   Family History:  His family history includes Cancer (age of onset: 62) in his father; Diabetes in his brother, father, and mother; Hyperlipidemia in his father and mother; Hypertension in his brother, father, and mother.   Social History:   reports that he quit  smoking about 19 years ago. His smoking use included cigarettes. He has a 2.40 pack-year smoking history. He has never used smokeless tobacco. He reports current alcohol use. He reports that he does not use drugs.   Review of Systems:  Review of Systems  Constitutional: Negative.   HENT: Negative.   Eyes: Negative.   Respiratory: Negative.   Cardiovascular: Negative.   Gastrointestinal: Negative.   Genitourinary: Negative.   Musculoskeletal: Negative.   Skin: Negative.   Neurological: Negative.   Endo/Heme/Allergies: Negative.   Psychiatric/Behavioral: Negative.     Physical Exam: Estimated body mass index is 36.59 kg/m as calculated from the following:   Height as of this encounter: 5\' 10"  (1.778 m).   Weight as of this encounter: 255 lb (115.7 kg). BP 126/76  Pulse 86   Temp 97.6 F (36.4 C)   Ht 5\' 10"  (1.778 m)   Wt 255 lb (115.7 kg)   SpO2 98%   BMI 36.59 kg/m  General Appearance: Well nourished, in no apparent distress.  Eyes: PERRLA, EOMs, conjunctiva no swelling or erythema, normal fundi and vessels.  Sinuses: No Frontal/maxillary tenderness  ENT/Mouth: Ext aud canals clear, normal light reflex with TMs without erythema, bulging. Good dentition. No erythema, swelling, or exudate on post pharynx. Tonsils not swollen or erythematous. Hearing normal.  Neck: Supple, thyroid normal. No bruits  Respiratory: Respiratory effort normal, BS equal bilaterally without rales, rhonchi, wheezing or stridor.  Cardio: RRR without murmurs, prominent S2, no rubs or gallops. Brisk peripheral pulses without edema.  Chest: symmetric, with normal excursions and percussion.  Abdomen: Soft, nontender, no guarding, rebound, hernias, masses, or organomegaly.  Lymphatics: Non tender without lymphadenopathy.  Genitourinary: defer Musculoskeletal: Full ROM all peripheral extremities,5/5 strength, and normal gait.  Skin: Warm, dry without rashes, lesions, ecchymosis. Neuro: Cranial nerves  intact, reflexes equal bilaterally. Normal muscle tone, no cerebellar symptoms. Sensation intact.  Psych: Awake and oriented X 3, normal affect, Insight and Judgment appropriate.   EKG: WNL, concave ST changes, not different from last year.  AORTA SCAN: N/A  Vicie Mutters 9:30 AM [facility]

## 2020-04-08 NOTE — Patient Instructions (Addendum)
Check with insurance to see if they cover Dexcom 6 better than the freestyle libre   Atherosclerosis  Atherosclerosis is narrowing and hardening of the arteries. Arteries are blood vessels that carry blood from the heart to all parts of the body. This blood contains oxygen. Arteries can become narrow or clogged with a buildup of fat, cholesterol, calcium, and other substances (plaque). Plaque decreases the amount of blood that can flow through the artery. Atherosclerosis can affect any artery in the body, including:  Heart arteries (coronary artery disease). This may cause a heart attack.  Brain arteries. This may cause a stroke (cerebrovascular accident).  Leg, arm, and pelvis arteries (peripheral artery disease). This may cause pain and numbness.  Kidney arteries. This may cause kidney (renal) failure. Treatment may slow the disease and prevent further damage to the heart, brain, peripheral arteries, and kidneys. What are the causes? Atherosclerosis develops slowly over many years. The inner layers of your arteries become damaged and allow the gradual buildup of plaque. The exact cause of atherosclerosis is not fully understood. Symptoms of atherosclerosis do not occur until the artery becomes narrow or blocked. What increases the risk? The following factors may make you more likely to develop this condition:  High blood pressure.  High cholesterol.  Being middle-aged or older.  Having a family history of atherosclerosis.  Having high blood fats (triglycerides).  Diabetes.  Being overweight.  Smoking tobacco.  Not exercising enough (sedentary lifestyle).  Having a substance in the blood called C-reactive protein (CRP). This is a sign of increased levels of inflammation in the body.  Sleep apnea.  Being stressed.  Drinking too much alcohol. What are the signs or symptoms? This condition may not cause any symptoms. If you have symptoms, they are caused by damage to an  area of your body that is not getting enough blood.  Coronary artery disease may cause chest pain and shortness of breath.  Decreased blood supply to your brain may cause a stroke. Signs of a stroke may include sudden: ? Weakness on one side of the body. ? Confusion. ? Changes in vision. ? Inability to speak or understand speech. ? Loss of balance, coordination, or the ability to walk. ? Severe headache. ? Loss of consciousness.  Peripheral arterial disease may cause pain and numbness, often in the legs and hips.  Renal failure may cause fatigue, nausea, swelling, and itchy skin. How is this diagnosed? This condition is diagnosed based on your medical history and a physical exam. During the exam:  Your health care provider will: ? Check your pulse in different places. ? Listen for a "whooshing" sound over your arteries (bruit).  You may have tests, such as: ? Blood tests to check your levels of cholesterol, triglycerides, and CRP. ? Electrocardiogram (ECG) to check for heart damage. ? Chest X-ray to see if you have an enlarged heart, which is a sign of heart failure. ? Stress test to see how your heart reacts to exercise. ? Echocardiogram to get images of the inside of your heart. ? Ankle-brachial index to compare blood pressure in your arms to blood pressure in your ankles. ? Ultrasound of your peripheral arteries to check blood flow. ? CT scan to check for damage to your heart or brain. ? X-rays of blood vessels after dye has been injected (angiogram) to check blood flow. How is this treated? Treatment starts with lifestyle changes, which may include:  Changing your diet.  Losing weight.  Reducing stress.  Exercising  and being physically active more regularly.  Not smoking. You may also need medicine to:  Lower triglycerides and cholesterol.  Control blood pressure.  Prevent blood clots.  Lower inflammation in your body.  Control your blood sugar. Sometimes,  surgery is needed to:  Remove plaque from an artery (endarterectomy).  Open or widen a narrowed heart artery (angioplasty).  Create a new path for your blood with one of these procedures: ? Heart (coronary) artery bypass graft surgery. ? Peripheral artery bypass graft surgery. Follow these instructions at home: Eating and drinking   Eat a heart-healthy diet. Talk with your health care provider or a diet and nutrition specialist (dietitian) if you need help. A heart-healthy diet involves: ? Limiting unhealthy fats and increasing healthy fats. Some examples of healthy fats are olive oil and canola oil. ? Eating plant-based foods, such as fruits, vegetables, nuts, whole grains, and legumes (such as peas and lentils).  Limit alcohol intake to no more than 1 drink a day for nonpregnant women and 2 drinks a day for men. One drink equals 12 oz of beer, 5 oz of wine, or 1 oz of hard liquor. Lifestyle  Follow an exercise program as told by your health care provider.  Maintain a healthy weight. Lose weight if your health care provider says that you need to do that.  Rest when you are tired.  Learn to manage your stress.  Do not use any products that contain nicotine or tobacco, such as cigarettes and e-cigarettes. If you need help quitting, ask your health care provider.  Do not abuse drugs. General instructions  Take over-the-counter and prescription medicines only as told by your health care provider.  Manage other health conditions as told by your health care provider.  Keep all follow-up visits as told by your health care provider. This is important. Contact a health care provider if:  You have chest pain or discomfort. This includes squeezing chest pain that may feel like indigestion (angina).  You have shortness of breath.  You have an irregular heartbeat.  You have unexplained fatigue.  You have unexplained pain or numbness in an arm, leg, or hip.  You have nausea,  swelling of your hands or feet, and itchy skin. Get help right away if:  You have any symptoms of a heart attack, such as: ? Chest pain. ? Shortness of breath. ? Pain in your neck, jaw, arms, back, or stomach. ? Cold sweat. ? Nausea. ? Light-headedness.  You have any symptoms of a stroke. "BE FAST" is an easy way to remember the main warning signs of a stroke: ? B - Balance. Signs are dizziness, sudden trouble walking, or loss of balance. ? E - Eyes. Signs are trouble seeing or a sudden change in vision. ? F - Face. Signs are sudden weakness or numbness of the face, or the face or eyelid drooping on one side. ? A - Arms. Signs are weakness or numbness in an arm. This happens suddenly and usually on one side of the body. ? S - Speech. Signs are sudden trouble speaking, slurred speech, or trouble understanding what people say. ? T - Time. Time to call emergency services. Write down what time symptoms started.  You have other signs of a stroke, such as: ? A sudden, severe headache with no known cause. ? Nausea or vomiting. ? Seizure. These symptoms may represent a serious problem that is an emergency. Do not wait to see if the symptoms will go away. Get  medical help right away. Call your local emergency services (911 in the [place] Do not drive yourself to the hospital. Summary  Atherosclerosis is narrowing and hardening of the arteries.  Arteries can become narrow or clogged with a buildup of fat, cholesterol, calcium, and other substances (plaque).  This condition may not cause any symptoms. If you do have symptoms, they are caused by damage to an area of your body that is not getting enough blood.  Treatment may include lifestyle changes and medicines. In some cases, surgery is needed. This information is not intended to replace advice given to you by your health care provider. Make sure you discuss any questions you have with your health care provider. Document Revised: 03/07/2018  Document Reviewed: 08/01/2017 Elsevier Patient Education  2020 [place] does your A1C results mean?  Your A1C is a measure of your sugar over the past 3 months   Use this chart as a guide to compare the results of your A1C blood test to your estimated average daily blood sugar:  A1C Range Average Sugar  4.0-6.0% 60-120 mg/dl  6.1-7.0% 121 - 150 mg/dl  7.1-8.0% 151-180 mg/dl  8.1-9.0% 181-210 mg/dl  10.1-11% 211-240 mg/dl  11.1-12.0% 271-300 mg/dl  12.1-13.0% 301-330 mg/dl  13.1-14.0% 331-360 mg/dl  Greater than 14.0% Greater than 360 mg/dl    General eating tips  What to Avoid . Avoid added sugars o Often added sugar can be found in processed foods such as many condiments, dry cereals, cakes, cookies, chips, crisps, crackers, candies, sweetened drinks, etc.  o Read labels and AVOID/DECREASE use of foods with the following in their ingredient list: Sugar, fructose, high fructose corn syrup, sucrose, glucose, maltose, dextrose, molasses, cane sugar, brown sugar, any type of syrup, agave nectar, etc.   . Avoid snacking in between meals- drink water or if you feel you need a snack, pick a high water content snack such as cucumbers, watermelon, or any veggie.  Marland Kitchen Avoid foods made with flour o If you are going to eat food made with flour, choose those made with whole-grains; and, minimize your consumption as much as is tolerable . Avoid processed foods o These foods are generally stocked in the middle of the grocery store.  o Focus on shopping on the perimeter of the grocery.  What to Include . Vegetables o GREEN LEAFY VEGETABLES: Kale, spinach, mustard greens, collard greens, cabbage, broccoli, etc. o OTHER: Asparagus, cauliflower, eggplant, carrots, peas, Brussel sprouts, tomatoes, bell peppers, zucchini, beets, cucumbers, etc. . Grains, seeds, and legumes o Beans: kidney beans, black eyed peas, garbanzo beans, black beans, pinto beans, etc. o Whole, unrefined grains:  brown rice, barley, bulgur, oatmeal, etc. . Healthy fats  o Avoid highly processed fats such as vegetable oil o Examples of healthy fats: avocado, olives, virgin olive oil, dark chocolate (?72% Cocoa), nuts (peanuts, almonds, walnuts, cashews, pecans, etc.) o Please still do small amount of these healthy fats, they are dense in calories.  . Low - Moderate Intake of Animal Sources of Protein o Meat sources: chicken, [place], salmon, tuna. Limit to 4 ounces of meat at one time or the size of your palm. o Consider limiting dairy sources, but when choosing dairy focus on: PLAIN [place] yogurt, cottage cheese, high-protein milk . Fruit o Choose berries

## 2020-04-12 LAB — CBC WITH DIFFERENTIAL/PLATELET
Absolute Monocytes: 736 cells/uL (ref 200–950)
Basophils Absolute: 32 cells/uL (ref 0–200)
Basophils Relative: 0.5 %
Eosinophils Absolute: 192 cells/uL (ref 15–500)
Eosinophils Relative: 3 %
HCT: 43.4 % (ref 38.5–50.0)
Hemoglobin: 14.2 g/dL (ref 13.2–17.1)
Lymphs Abs: 2074 cells/uL (ref 850–3900)
MCH: 27.9 pg (ref 27.0–33.0)
MCHC: 32.7 g/dL (ref 32.0–36.0)
MCV: 85.3 fL (ref 80.0–100.0)
MPV: 9.3 fL (ref 7.5–12.5)
Monocytes Relative: 11.5 %
Neutro Abs: 3366 cells/uL (ref 1500–7800)
Neutrophils Relative %: 52.6 %
Platelets: 324 10*3/uL (ref 140–400)
RBC: 5.09 10*6/uL (ref 4.20–5.80)
RDW: 13.1 % (ref 11.0–15.0)
Total Lymphocyte: 32.4 %
WBC: 6.4 10*3/uL (ref 3.8–10.8)

## 2020-04-12 LAB — LIPID PANEL
Cholesterol: 131 mg/dL (ref <200)
HDL: 31 mg/dL — ABNORMAL LOW (ref >=40)
LDL Cholesterol (Calc): 75 mg/dL (calc)
Non-HDL Cholesterol (Calc): 100 mg/dL (calc) (ref <130)
Total CHOL/HDL Ratio: 4.2 (calc) (ref <5.0)
Triglycerides: 150 mg/dL — ABNORMAL HIGH (ref <150)

## 2020-04-12 LAB — IRON, TOTAL/TOTAL IRON BINDING CAP
%SAT: 30 % (calc) (ref 20–48)
Iron: 91 ug/dL (ref 50–180)
TIBC: 304 mcg/dL (calc) (ref 250–425)

## 2020-04-12 LAB — URINALYSIS, ROUTINE W REFLEX MICROSCOPIC
Bilirubin Urine: NEGATIVE
Hgb urine dipstick: NEGATIVE
Ketones, ur: NEGATIVE
Leukocytes,Ua: NEGATIVE
Nitrite: NEGATIVE
Protein, ur: NEGATIVE
Specific Gravity, Urine: 1.026 (ref 1.001–1.03)
pH: <=5 (ref 5.0–8.0)

## 2020-04-12 LAB — COMPLETE METABOLIC PANEL WITH GFR
AG Ratio: 1.6 (calc) (ref 1.0–2.5)
ALT: 16 U/L (ref 9–46)
AST: 15 U/L (ref 10–35)
Albumin: 4.3 g/dL (ref 3.6–5.1)
Alkaline phosphatase (APISO): 70 U/L (ref 35–144)
BUN: 21 mg/dL (ref 7–25)
CO2: 28 mmol/L (ref 20–32)
Calcium: 9.9 mg/dL (ref 8.6–10.3)
Chloride: 102 mmol/L (ref 98–110)
Creat: 1.16 mg/dL (ref 0.70–1.33)
GFR, Est African American: 81 mL/min/{1.73_m2} (ref >=60)
GFR, Est Non African American: 70 mL/min/{1.73_m2} (ref >=60)
Globulin: 2.7 g/dL (calc) (ref 1.9–3.7)
Glucose, Bld: 194 mg/dL — ABNORMAL HIGH (ref 65–99)
Potassium: 4.1 mmol/L (ref 3.5–5.3)
Sodium: 139 mmol/L (ref 135–146)
Total Bilirubin: 0.5 mg/dL (ref 0.2–1.2)
Total Protein: 7 g/dL (ref 6.1–8.1)

## 2020-04-12 LAB — MAGNESIUM: Magnesium: 2 mg/dL (ref 1.5–2.5)

## 2020-04-12 LAB — TSH: TSH: 0.94 mIU/L (ref 0.40–4.50)

## 2020-04-12 LAB — VITAMIN B12: Vitamin B-12: 482 pg/mL (ref 200–1100)

## 2020-04-12 LAB — VITAMIN D 25 HYDROXY (VIT D DEFICIENCY, FRACTURES): Vit D, 25-Hydroxy: 119 ng/mL — ABNORMAL HIGH (ref 30–100)

## 2020-04-12 LAB — HEMOGLOBIN A1C
Hgb A1c MFr Bld: 11.4 % of total Hgb — ABNORMAL HIGH (ref <5.7)
Mean Plasma Glucose: 280 (calc)
eAG (mmol/L): 15.5 (calc)

## 2020-04-12 LAB — PSA: PSA: 1.4 ng/mL (ref <=4.0)

## 2020-04-12 LAB — MICROALBUMIN / CREATININE URINE RATIO
Creatinine, Urine: 193 mg/dL (ref 20–320)
Microalb Creat Ratio: 8 mcg/mg creat (ref <30)
Microalb, Ur: 1.5 mg/dL

## 2020-04-12 LAB — TESTOSTERONE: Testosterone: 445 ng/dL (ref 250–827)

## 2020-04-12 LAB — ESTRADIOL: Estradiol: 36 pg/mL (ref <=39)

## 2020-04-12 LAB — DIHYDROTESTOSTERONE: Dihydrotestosterone LC/MS/MS: 12 ng/dL (ref 12–65)

## 2020-04-15 ENCOUNTER — Other Ambulatory Visit: Payer: Self-pay | Admitting: Internal Medicine

## 2020-04-22 ENCOUNTER — Other Ambulatory Visit: Payer: Self-pay | Admitting: Internal Medicine

## 2020-04-22 ENCOUNTER — Other Ambulatory Visit: Payer: Self-pay

## 2020-04-22 MED ORDER — NOVOLIN 70/30 (70-30) 100 UNIT/ML ~~LOC~~ SUSP: SUBCUTANEOUS | 3 refills | Status: DC

## 2020-05-20 ENCOUNTER — Ambulatory Visit (INDEPENDENT_AMBULATORY_CARE_PROVIDER_SITE_OTHER): Payer: BC Managed Care – PPO | Admitting: Physician Assistant

## 2020-05-20 ENCOUNTER — Encounter: Payer: Self-pay | Admitting: Physician Assistant

## 2020-05-20 ENCOUNTER — Other Ambulatory Visit: Payer: Self-pay

## 2020-05-20 VITALS — BP 128/76 | HR 61 | Temp 97.7°F | Wt 255.0 lb

## 2020-05-20 DIAGNOSIS — E119 Type 2 diabetes mellitus without complications: Secondary | ICD-10-CM

## 2020-05-20 DIAGNOSIS — N182 Chronic kidney disease, stage 2 (mild): Secondary | ICD-10-CM

## 2020-05-20 DIAGNOSIS — Z794 Long term (current) use of insulin: Secondary | ICD-10-CM | POA: Diagnosis not present

## 2020-05-20 DIAGNOSIS — E1122 Type 2 diabetes mellitus with diabetic chronic kidney disease: Secondary | ICD-10-CM

## 2020-05-20 NOTE — Progress Notes (Signed)
Diabetes Education and Follow-Up Visit  59 y.o.male presents for diabetic education.   with diabetic chronic kidney disease he is on ACE/ARB Hyperlipidemia not at goal, on zetia and lipitor  he is on bASA every other day due to history of retroperitoneal hematoma   he is on 70/30 insulin he is on 18 units in am and 18 units with evening meal. He has not been taking his morning insulin if his sugar is below 120, he has been having some low sugars in the 90's 80's around 9 pm.  He works 530-600AM until 11 pm.  metformin 500mg , two tablets twice a day he has continuous glucose monitor freestyle Elenor Legato which is helping him to better control his glucose.   He has tried to improve eating at lunch time.   Denies any low sugars, states low is 140-150 and high is 170 and fasting glucose ranges around 160-170  denies paresthesia of the feet, polydipsia, polyuria and visual disturbances.  Eye exam: Dr. Einar Gip 2020, he is due next month- wears glasses  Last hemoglobin A1c was: Lab Results  Component Value Date   HGBA1C 11.4 (H) 04/08/2020   HGBA1C 9.4 (H) 10/09/2019   HGBA1C 7.9 (H) 06/30/2019    Body mass index is 36.59 kg/m.   Lab Results  Component Value Date   GFRAA 81 04/08/2020    Lab Results  Component Value Date   CREATININE 1.16 04/08/2020   BUN 21 04/08/2020   NA 139 04/08/2020   K 4.1 04/08/2020   CL 102 04/08/2020   CO2 28 04/08/2020    Lab Results  Component Value Date   MICROALBUR 1.5 04/08/2020    Lab Results  Component Value Date   CHOL 131 04/08/2020   HDL 31 (L) 04/08/2020   LDLCALC 75 04/08/2020   TRIG 150 (H) 04/08/2020   CHOLHDL 4.2 04/08/2020     Problem List has Nonallergic rhinitis; Obstructive sleep apnea-questioned; Hypertension; Hyperlipidemia associated with type 2 diabetes mellitus ([place]); Type 2 diabetes mellitus, controlled, with renal complications ([place]); [name]; Testosterone deficiency; Morbid obesity ([place]); Poor compliance  with diet; Medication management; 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  Medications Current Outpatient Medications on File Prior to Visit  Medication Sig  . amLODipine (NORVASC) 10 MG tablet Take 1 tablet Daily for BP  . atorvastatin (LIPITOR) 20 MG tablet Take 1 tablet Daily for Cholesterol  . bisoprolol (ZEBETA) 10 MG tablet Take 1 tablet Daily for BP  . ciclesonide (OMNARIS) 50 MCG/ACT nasal spray Place 2 sprays into both nostrils daily as needed for allergies.   . clotrimazole-betamethasone (LOTRISONE) cream APPLY 3 TIMES A DAY AS NEEDED  . Continuous Blood Gluc Sensor (FREESTYLE LIBRE 14 DAY SENSOR) MISC USE AS DIRECTED  . Continuous Blood Gluc Sensor (FREESTYLE LIBRE SENSOR SYSTEM) MISC 1 device with sensors DX Z79.4 insulin dependent diabetes  . Continuous Blood Gluc Sensor MISC 1 each by Does not apply route as directed. Use as directed every 10 days. May dispense FreeStyle [employer] or similar.  . ezetimibe (ZETIA) 10 MG tablet TAKE 1 TABLET BY MOUTH EVERY DAY  . fluconazole (DIFLUCAN) 150 MG tablet TAKE 1 TABLET DAILY FOR YEAST INFECTION  . glucose blood (FREESTYLE LITE) test strip Check blood sugar 3 times a day or as directed.  [id number]  . insulin NPH-regular Human (NOVOLIN 70/30) (70-30) 100 UNIT/ML injection INJECT UP TO 30 UNITS IN MORNING WITH FOOD & UP TO 30 UNITS WITH EVENING MEAL,ADJUST TO SUGARS  . Insulin Syringe-Needle U-100 (BD VEO INSULIN SYRINGE U/F) 31G X 15/64" 0.5 ML MISC USE 2 X /DAY FOR INSULIN/DISPENSE INSURANCE PREFERENCE DX: E11.22  . ketoconazole (NIZORAL) 2 % shampoo APPLY 1 APPLICATION TOPICALLY 2 (TWO) TIMES A WEEK. APPLY & SHAMPOO 2 X /WEEK  . metFORMIN (GLUCOPHAGE-XR) 500 MG 24 hr tablet Take 2  tablets 2 x /day with Meals for Diabetes  . senna-docusate (SENOKOT-S) 8.6-50 MG tablet Take by mouth.  . tadalafil (CIALIS) 20 MG tablet Take 1/2 to 1 tablet every 2-3 days as needed (Patient taking differently: Take 10-20 mg by mouth daily as needed for erectile dysfunction. Take 1/2 to 1 tablet every 2-3 days as needed)  . telmisartan (MICARDIS) 40 MG tablet TAKE 1 TABLET BY MOUTH EVERY DAY  . testosterone cypionate (DEPOTESTOSTERONE CYPIONATE) 200 MG/ML injection Inject 2 ml into Muscle every 2 weeks  . Vitamin D, Ergocalciferol, (DRISDOL) 1.25 MG (50000 UNIT) CAPS capsule Take 1 capsule 2 x /week on Mon - Thurs   Current Facility-Administered Medications on File Prior to Visit  Medication  . testosterone cypionate (DEPOTESTOSTERONE CYPIONATE) injection 200 mg  . testosterone cypionate (DEPOTESTOSTERONE CYPIONATE) injection 200 mg    ROS- see HPI  Physical Exam: Blood pressure 128/76, pulse 61, temperature 97.7 F (36.5 C), weight 255 lb (115.7 kg), SpO2 97 %. Body mass index is 36.59 kg/m. General Appearance: Well nourished, in no apparent distress. Eyes: PERRLA, EOMs, conjunctiva no swelling or erythema ENT/Mouth: Ext aud canals clear, TMs without erythema, bulging. No erythema, swelling, or exudate on post pharynx.  Tonsils not swollen or erythematous. Hearing normal.  Respiratory: Respiratory effort normal, BS equal bilaterally without rales, rhonchi, wheezing or stridor.  Cardio: RRR with no MRGs. Brisk peripheral pulses without edema.  Abdomen: Soft, + BS.  Non tender, no guarding, rebound, hernias, masses. Musculoskeletal: Full ROM, 5/5 strength, normal gait.  Skin: Warm, dry without rashes, lesions, ecchymosis.  Neuro: Cranial nerves intact. Normal muscle tone, no cerebellar symptoms. Sensation intact.    Plan and Assessment: Diabetes Education: Reviewed 'ABCs' of diabetes management (respective goals in parentheses):  A1C (<7), blood pressure (<130/80), and cholesterol  (LDL <70) Eye Exam yearly and Dental Exam every 6 months. Dietary recommendations Physical Activity recommendations - Strongly advised him to start checking sugars at different times of the day - check 2 times a day, rotating checks - given sugar log and advised how to fill it and to bring it at next appt  - went over how needs to be with food - needs to base morning insulin off night time sugar and night time insulin off morning sugar - given foot care handout and explained the principles  - given instructions for hypoglycemia management    Future Appointments  Date Time Provider [place]  07/12/2020  8:45 AM Vicie Mutters, PA-C [facility] None  04/10/2021  9:00 AM Vicie Mutters, PA-C [facility] None

## 2020-05-20 NOTE — Patient Instructions (Addendum)
We are starting you on a mix of insulin- it is called 70/30 mixed insulin The 30 of insulin is fast acting and MUST be taken with a meal The 70 of the insulin is an intermediate dose and will last for longer to cover you during the day.   Please check your sugars at home: check it in in the morning before food Check it before your dinner And for a while check it before bed while we are adjusting your medications  IT IS VERY IMPORTANT TO BRING THIS LOG IN to your office visits.   Your pre breakfast/morning insulin is titrated based off pre-dinner glucose reading.  The night time/predinner dose of insulin is based off your morning glucose.   We will likely need to increase this but we will do this slowly because a low sugar is much more dangerous than a high sugar.    Your brain needs 2 things, oxygen and sugar, so lets make sure it gets both.  Please remember only take the insulin WITH food   if you are sick or unable to eat DO NOT take your insulin   If at any time you have a question or concern, call the office or message [place] in [place].   Somogyi effect The brain needs two things: oxygen and sugar. If the blood sugar level drops too low in the early morning hours, hormones (such as growth hormone, cortisol, and catecholamines) are released to make sure you brain can still function. These help reverse the low blood sugar level but may lead to blood sugar levels that are higher than normal in the morning. This is common for patient that take insulin at night or do not ear regular snacks.  Please schedule to get up in the middle of the night to check your blood sugar.   This may be happening to you. Please eat a high protein night time snack.     Bad carbs also include fruit juice, alcohol, and sweet tea. These are empty calories that do not signal to your brain that you are full.   Please remember the good carbs are still carbs which convert into sugar. So please measure them out no  more than 1/2-1 cup of rice, oatmeal, pasta, and beans  Veggies are however free foods! Pile them on.   Not all fruit is created equal. Please see the list below, the fruit at the bottom is higher in sugars than the fruit at the top. Please avoid all dried fruits.

## 2020-05-23 ENCOUNTER — Other Ambulatory Visit: Payer: Self-pay | Admitting: Internal Medicine

## 2020-05-23 ENCOUNTER — Other Ambulatory Visit: Payer: Self-pay | Admitting: Adult Health

## 2020-05-23 DIAGNOSIS — I10 Essential (primary) hypertension: Secondary | ICD-10-CM

## 2020-05-31 DIAGNOSIS — N200 Calculus of kidney: Secondary | ICD-10-CM | POA: Diagnosis not present

## 2020-05-31 DIAGNOSIS — R8271 Bacteriuria: Secondary | ICD-10-CM | POA: Diagnosis not present

## 2020-05-31 DIAGNOSIS — N210 Calculus in bladder: Secondary | ICD-10-CM | POA: Diagnosis not present

## 2020-07-11 NOTE — Progress Notes (Deleted)
3 Month Follow Up  Assessment and Plan  Essential hypertension  DASH diet, exercise and monitor at home. Call if greater than 130/80.  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  Hyperlipidemia associated with type 2 diabetes mellitus ([place]) Continue Lipitor 20mg  -     Lipid panel Discussed dietary and exercise modifications Walks 5,000 steps daily, continue and work on increasing.  Controlled type 2 diabetes mellitus with stage 3 chronic kidney disease, with long-term current use of insulin ([place]) Discussed general issues about diabetes pathophysiology and management.,  Educational material distributed.,  Suggested low cholesterol diet.,  Encouraged aerobic exercise.,  Discussed foot care,  Reminded to get yearly retinal exam.  Insulin-requiring or dependent type II diabetes mellitus ([place]) -     Hemoglobin A1c Discussed dietary and exercise modification  CKD stage 2 due to type 2 diabetes mellitus (HCC) Increase fluids, avoid NSAIDS, monitor sugars, will monitor -     Hemoglobin A1c Increase fluids, avoid NSAIDS, monitor sugars, will monitor  Morbid obesity (HCC) - follow up 3 months for progress monitoring - increase veggies, decrease carbs - long discussion about weight loss, diet, and exercise -Encourage healthy behaviors  Poor compliance with diet Discussed dietary and exercise modifications at length Discussed effect on glucose and over all health & immunity  [name] -     VITAMIN D 25 Hydroxy (Vit-D Deficiency)  Continue supplementation  Testosterone deficiency -     Testosterone Improvement in  overall well being  Obstructive sleep apnea Sleep apnea-needs to wear nightly Continue CPAP, discussed benefits Helping with daytime fatigue Discussed dietary and exercise modifications for weight loss  Medication management -Continued  Discussed med's effects and SE's. Screening labs and tests as requested with regular follow-up as recommended. Over 30 minutes of interview, exam, counseling, chart review and critical decision making was performed Future Appointments  Date Time Provider [place]  07/12/2020  8:45 AM Vicie Mutters, PA-C [facility] None  04/10/2021  9:00 AM Vicie Mutters, PA-C [facility] None    HPI Patient presents for a three month follow up.  Reports overall he is doing well.  He has no health or medication concerns today.  He has history of HTN. HLD, DMII using insulin with CKD, ED, OSA on CPAP, [name] and weight.  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  His blood pressure has been controlled at home, today their BP is   He does not workout but tried to get 5000 steps a day.  He denies chest pain, shortness of breath, dizziness.   He has been working on diet and exercise for diabetes with diabetic chronic kidney disease he is on ACE/ARB Hyperlipidemia not at goal, on zetia and lipitor  he is on bASA every other day due to history of retroperitoneal hematoma   he is on 70/30 insulin he is on 18 units in am and 18 units with evening meal. He has not been taking his morning insulin if his sugar is below 120, he has been having some low sugars in the 90's 80's around 9 pm.  He works 530-600AM until 43  pm.  metformin 500mg , two tablets twice a day he has continuous glucose monitor freestyle Elenor Legato which is helping him to better control his glucose.   He has tried to improve eating at lunch time.   Denies any low sugars, states low is 140-150 and high is 170 and fasting glucose  ranges around 160-170  denies paresthesia of the feet, polydipsia, polyuria and visual disturbances.  Eye exam: needs this year Last A1C was:  Lab Results  Component Value Date   HGBA1C 11.4 (H) 04/08/2020   Lab Results  Component Value Date   CHOL 131 04/08/2020   HDL 31 (L) 04/08/2020   LDLCALC 75 04/08/2020   TRIG 150 (H) 04/08/2020   CHOLHDL 4.2 04/08/2020   Lab Results  Component Value Date   GFRAA 81 04/08/2020   Patient is on Vitamin D supplement.   Lab Results  Component Value Date   VD25OH 119 (H) 04/08/2020   BMI is There is no height or weight on file to calculate BMI., he is working on diet and exercise. Wt Readings from Last 3 Encounters:  05/20/20 255 lb (115.7 kg)  04/08/20 255 lb (115.7 kg)  10/26/19 262 lb (118.8 kg)   He has a history of testosterone deficiency and is on testosterone replacement. He states that the testosterone helps with his energy, libido, muscle mass. Received injection today, defer lab. Lab Results  Component Value Date   TESTOSTERONE 445 04/08/2020     Current Medications:   Current Outpatient Medications (Endocrine & Metabolic):  .  insulin NPH-regular Human (NOVOLIN 70/30) (70-30) 100 UNIT/ML injection, INJECT UP TO 30 UNITS IN MORNING WITH FOOD & UP TO 30 UNITS WITH EVENING MEAL,ADJUST TO SUGARS .  metFORMIN (GLUCOPHAGE-XR) 500 MG 24 hr tablet, Take 2 tablets 2 x /day with Meals for Diabetes .  testosterone cypionate (DEPOTESTOSTERONE CYPIONATE) 200 MG/ML injection, Inject 2 ml into Muscle every 2 weeks  Current Facility-Administered Medications (Endocrine & Metabolic):  .  testosterone cypionate (DEPOTESTOSTERONE CYPIONATE) injection 200 mg .  testosterone cypionate (DEPOTESTOSTERONE CYPIONATE) injection 200 mg  Current Outpatient Medications (Cardiovascular):  .  amLODipine (NORVASC) 10 MG tablet, Take 1 tablet Daily for BP .  atorvastatin (LIPITOR) 20 MG tablet, Take 1 tablet Daily for Cholesterol .  bisoprolol (ZEBETA)  10 MG tablet, TAKE 1 TABLET DAILY FOR BLOOD PRESSURE .  ezetimibe (ZETIA) 10 MG tablet, Take 1 tablet Daily forCholesterol .  tadalafil (CIALIS) 20 MG tablet, Take 1/2 to 1 tablet every 2-3 days as needed (Patient taking differently: Take 10-20 mg by mouth daily as needed for erectile dysfunction. Take 1/2 to 1 tablet every 2-3 days as needed) .  telmisartan (MICARDIS) 40 MG tablet, TAKE 1 TABLET BY MOUTH EVERY DAY   Current Outpatient Medications (Respiratory):  .  ciclesonide (OMNARIS) 50 MCG/ACT nasal spray, Place 2 sprays into both nostrils daily as needed for allergies.        Current Outpatient Medications (Other):  .  clotrimazole-betamethasone (LOTRISONE) cream, APPLY 3 TIMES A DAY AS NEEDED .  Continuous Blood Gluc Sensor (FREESTYLE LIBRE 14 DAY SENSOR) MISC, USE AS DIRECTED .  Continuous Blood Gluc Sensor (FREESTYLE LIBRE SENSOR SYSTEM) MISC, 1 device with sensors DX Z79.4 insulin dependent diabetes .  Continuous Blood Gluc Sensor MISC, 1 each by Does not apply route as directed. Use as directed every 10 days. May dispense FreeStyle [employer] or similar. .  fluconazole (DIFLUCAN) 150 MG tablet, TAKE 1 TABLET DAILY FOR YEAST INFECTION .  glucose blood (FREESTYLE LITE) test strip, Check blood sugar 3 times a day or as directed. DX-E11.22 .  Insulin Syringe-Needle U-100 (BD VEO INSULIN SYRINGE U/F) 31G X 15/64" 0.5 ML MISC, USE 2 X /DAY FOR INSULIN/DISPENSE INSURANCE PREFERENCE DX: E11.22 .  ketoconazole (NIZORAL) 2 % shampoo, APPLY 1 APPLICATION TOPICALLY 2 (TWO) TIMES A WEEK. APPLY & SHAMPOO 2 X /WEEK .  senna-docusate (SENOKOT-S) 8.6-50 MG tablet, Take by mouth. .  Vitamin D, Ergocalciferol, (DRISDOL) 1.25 MG (50000 UNIT) CAPS capsule, Take 1 capsule 2 x /week on Mon - Thurs   Allergies:  Allergies  Allergen Reactions  . 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   Surgical History:  He  has a past surgical history that includes Wisdom tooth extraction; Hernia repair; Colonoscopy; and Rotator cuff repair (Left, 2017 sept 5).   Family History:  His family history includes Cancer (age of onset: 76) in his father; Diabetes in his brother, father, and mother; Hyperlipidemia in his father and mother; Hypertension in his brother, father, and mother.   Social History:   reports that he quit smoking about 19 years ago. His smoking use included cigarettes. He has a 2.40 pack-year smoking history. He has never used smokeless tobacco. He reports current alcohol use. He reports that he does not use drugs.   Review of Systems:  Review of Systems  Constitutional: Negative.   HENT: Negative.   Eyes: Negative.   Respiratory: Negative.   Cardiovascular: Negative.   Gastrointestinal: Negative.   Genitourinary: Negative.   Musculoskeletal: Negative.   Skin: Negative.     Physical Exam: Estimated body mass index is 36.59 kg/m as calculated from the following:   Height as of 04/08/20: 5\' 10"  (1.778 m).   Weight as of 05/20/20: 255 lb (115.7 kg). There were no vitals taken for this visit. General Appearance: Well nourished, in no apparent distress.  Eyes: PERRLA, EOMs, conjunctiva no swelling or erythema, normal fundi and vessels.  Sinuses: No Frontal/maxillary  tenderness  ENT/Mouth: Ext aud canals clear, normal light reflex with TMs without erythema, bulging. Good dentition. No erythema, swelling, or exudate on post pharynx. Tonsils not swollen or erythematous. Hearing normal.  Neck: Supple, thyroid normal. No bruits  Respiratory: Respiratory effort normal, BS equal bilaterally without rales, rhonchi, wheezing or stridor.  Cardio: RRR without murmurs, prominent S2, no rubs or gallops. Brisk peripheral pulses without edema.  Chest: symmetric, with normal excursions and percussion.  Abdomen: Soft, nontender, no guarding, rebound, hernias, masses, or organomegaly.  Lymphatics: Non tender without lymphadenopathy.  Musculoskeletal: Full ROM all peripheral extremities,5/5 strength, and normal gait.  Skin: Warm, dry without rashes, lesions, ecchymosis. Neuro: Cranial nerves intact, reflexes equal bilaterally. Normal muscle tone, no cerebellar symptoms. Sensation intact.  Psych: Awake and oriented X 3, normal affect, Insight and Judgment appropriate.    Vicie Mutters 10:00 AM [facility]

## 2020-07-12 ENCOUNTER — Ambulatory Visit: Payer: BC Managed Care – PPO | Admitting: Physician Assistant

## 2020-07-29 ENCOUNTER — Ambulatory Visit: Payer: BC Managed Care – PPO | Admitting: Physician Assistant

## 2020-07-29 ENCOUNTER — Encounter: Payer: Self-pay | Admitting: Physician Assistant

## 2020-07-29 ENCOUNTER — Other Ambulatory Visit: Payer: Self-pay

## 2020-07-29 VITALS — BP 132/72 | HR 63 | Temp 97.1°F | Resp 12 | Wt 258.0 lb

## 2020-07-29 DIAGNOSIS — I10 Essential (primary) hypertension: Secondary | ICD-10-CM | POA: Diagnosis not present

## 2020-07-29 DIAGNOSIS — E119 Type 2 diabetes mellitus without complications: Secondary | ICD-10-CM | POA: Diagnosis not present

## 2020-07-29 DIAGNOSIS — E1122 Type 2 diabetes mellitus with diabetic chronic kidney disease: Secondary | ICD-10-CM | POA: Diagnosis not present

## 2020-07-29 DIAGNOSIS — N182 Chronic kidney disease, stage 2 (mild): Secondary | ICD-10-CM

## 2020-07-29 DIAGNOSIS — I700 Atherosclerosis of aorta: Secondary | ICD-10-CM

## 2020-07-29 DIAGNOSIS — N521 Erectile dysfunction due to diseases classified elsewhere: Secondary | ICD-10-CM

## 2020-07-29 DIAGNOSIS — I708 Atherosclerosis of other arteries: Secondary | ICD-10-CM

## 2020-07-29 DIAGNOSIS — E1169 Type 2 diabetes mellitus with other specified complication: Secondary | ICD-10-CM

## 2020-07-29 DIAGNOSIS — E559 Vitamin D deficiency, unspecified: Secondary | ICD-10-CM | POA: Diagnosis not present

## 2020-07-29 DIAGNOSIS — E785 Hyperlipidemia, unspecified: Secondary | ICD-10-CM

## 2020-07-29 DIAGNOSIS — N183 Chronic kidney disease, stage 3 unspecified: Secondary | ICD-10-CM

## 2020-07-29 DIAGNOSIS — Z794 Long term (current) use of insulin: Secondary | ICD-10-CM

## 2020-07-29 DIAGNOSIS — E291 Testicular hypofunction: Secondary | ICD-10-CM

## 2020-07-29 DIAGNOSIS — Z79899 Other long term (current) drug therapy: Secondary | ICD-10-CM

## 2020-07-29 DIAGNOSIS — E1159 Type 2 diabetes mellitus with other circulatory complications: Secondary | ICD-10-CM

## 2020-07-29 MED ORDER — FREESTYLE LIBRE 14 DAY SENSOR MISC: 99 refills | Status: DC

## 2020-07-29 MED ORDER — TESTOSTERONE CYPIONATE 200 MG/ML IM SOLN
200.0000 mg | INTRAMUSCULAR | Status: DC
  Administered 2020-07-29: 200 mg via INTRAMUSCULAR

## 2020-07-29 NOTE — Patient Instructions (Addendum)
Find out type of stones Please stop your vitamin D, this was very elevated last time at 110- just stop them, this can contribute  Do lemon water, crystal lite lemonade tea.  LOTS of water.   Can adjust BP meds pending type of stones.   Monitor for low sugars at night or during the night, this can be sweating in your sleep, waking up frequently or nightmares- if that is the case, check your sugars and decrease the night time dose by 2-4 units.      Bad carbs also include fruit juice, alcohol, and sweet tea. These are empty calories that do not signal to your brain that you are full.   Please remember the good carbs are still carbs which convert into sugar. So please measure them out no more than 1/2-1 cup of rice, oatmeal, pasta, and beans  Veggies are however free foods! Pile them on.   Not all fruit is created equal. Please see the list below, the fruit at the bottom is higher in sugars than the fruit at the top. Please avoid all dried fruits.      Somogyi effect The brain needs two things: oxygen and sugar. If the blood sugar level drops too low in the early morning hours, hormones (such as growth hormone, cortisol, and catecholamines) are released to make sure you brain can still function. These help reverse the low blood sugar level but may lead to blood sugar levels that are higher than normal in the morning. This is common for patient that take insulin at night or do not ear regular snacks.  Please schedule to get up in the middle of the night to check your blood sugar.   This may be happening to you. Please eat a high protein night time snack and we will be decreasing your night time insulin as follows:

## 2020-07-29 NOTE — Progress Notes (Signed)
Patient was given TESTO inj in left GLUTE.

## 2020-07-29 NOTE — Progress Notes (Signed)
Patient ID: Russell [name], male   DOB: Aug 14, 1961, 59 y.o.   MRN: [id number]  3 Month Follow Up  Assessment and Plan  Essential hypertension  DASH diet, exercise and monitor at home. Call if greater than 130/80.  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  Hyperlipidemia associated with type 2 diabetes mellitus ([place]) Continue Lipitor 20mg - goal less than 70 -     Lipid panel Discussed dietary and exercise modifications  Controlled type 2 diabetes mellitus with stage 3 chronic kidney disease, with long-term current use of insulin ([place]) Discussed general issues about diabetes pathophysiology and management.,  Educational material distributed.,  Suggested low cholesterol diet.,  Encouraged aerobic exercise.,  Discussed foot care,  Reminded to get yearly retinal exam.  Insulin-requiring or dependent type [name] diabetes mellitus ([place]) -     Hemoglobin A1c Discussed dietary and exercise modification  CKD stage 2 due to type 2 diabetes mellitus (HCC) Increase fluids, avoid NSAIDS, monitor sugars, will monitor -     Hemoglobin A1c Increase fluids, avoid NSAIDS, monitor sugars, will monitor  Morbid obesity ([place]) - follow up 3 months for progress monitoring - increase veggies, decrease carbs - long discussion about weight loss, diet, and exercise -Encourage healthy behaviors  Poor compliance with diet Discussed dietary and exercise modifications at length Discussed effect on glucose and over all health & immunity  [name] -     VITAMIN D 25 Hydroxy (Vit-D Deficiency)  Continue supplementation  Testosterone deficiency -      Testosterone Improvement in overall well being  Obstructive sleep apnea Sleep apnea-needs to wear nightly Continue CPAP, discussed benefits Helping with daytime fatigue Discussed dietary and exercise modifications for weight loss  Medication management -Continued  Discussed med's effects and SE's. Screening labs and tests as requested with regular follow-up as recommended. Over 30 minutes of interview, exam, counseling, chart review and critical decision making was performed Future Appointments  Date Time Provider [place]  04/10/2021  9:00 AM Vicie Mutters, PA-C [facility] None    HPI Patient presents for a three month follow up.  Reports overall he is doing well.  He has no health or medication concerns today.  He has history of HTN. HLD, DMII using insulin with CKD, ED, OSA on CPAP, [name] and weight.  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  He has been passing stones for 2 months, following Dr. Jeffie Pollock, he is on flomax. Unknown type of stones, last vitamin D was elevated. He is not on a fluid pill.   His blood pressure has been controlled at home, today their BP is BP: 132/72 He does not workout but tried to get 5000 steps a day.  He denies chest pain, shortness of breath, dizziness.   He has been working on diet and exercise for diabetes with diabetic chronic kidney disease he is on ACE/ARB Hyperlipidemia not at goal, on zetia and lipitor  he is on bASA every other day due to history of retroperitoneal hematoma   he is on 70/30 insulin he is on 18 units in am and 25 units with evening  meal. H He works 530-600AM until 11 pm.  metformin 500mg , two tablets twice a day he has continuous glucose monitor freestyle Elenor Legato which is helping him to better control his glucose.  NEEDS MORE SENSORES He has tried to improve eating at lunch time.   He has been having sugars 110-130's, no low sugars.   denies paresthesia of the feet, polydipsia, polyuria and visual disturbances.  Eye exam: needs this year Last A1C was:  Lab Results  Component Value Date   HGBA1C 11.4 (H) 04/08/2020   Lab Results  Component Value Date   CHOL 131 04/08/2020   HDL 31 (L) 04/08/2020   LDLCALC 75 04/08/2020   TRIG 150 (H) 04/08/2020   CHOLHDL 4.2 04/08/2020   Lab Results  Component Value Date   GFRAA 81 04/08/2020   Patient is on Vitamin D supplement.   Lab Results  Component Value Date   VD25OH 119 (H) 04/08/2020   BMI is Body mass index is 37.02 kg/m., he is working on diet and exercise. Wt Readings from Last 3 Encounters:  07/29/20 258 lb (117 kg)  05/20/20 255 lb (115.7 kg)  04/08/20 255 lb (115.7 kg)   He has a history of testosterone deficiency and is on testosterone replacement. He states that the testosterone helps with his energy, libido, muscle mass. Received injection today, defer lab. Lab Results  Component Value Date   TESTOSTERONE 445 04/08/2020     Current Medications:   Current Outpatient Medications (Endocrine & Metabolic):  .  insulin NPH-regular Human (NOVOLIN 70/30) (70-30) 100 UNIT/ML injection, INJECT UP TO 30 UNITS IN MORNING WITH FOOD & UP TO 30 UNITS WITH EVENING MEAL,ADJUST TO SUGARS .  metFORMIN (GLUCOPHAGE-XR) 500 MG 24 hr tablet, Take 2 tablets 2 x /day with Meals for Diabetes .  testosterone cypionate (DEPOTESTOSTERONE CYPIONATE) 200 MG/ML injection, Inject 2 ml into Muscle every 2 weeks  Current Facility-Administered Medications (Endocrine & Metabolic):  .  testosterone cypionate (DEPOTESTOSTERONE CYPIONATE) injection 200 mg .  testosterone cypionate (DEPOTESTOSTERONE CYPIONATE) injection 200 mg  Current Outpatient Medications (Cardiovascular):  .  amLODipine (NORVASC) 10 MG tablet, Take 1 tablet Daily for BP .  atorvastatin (LIPITOR) 20 MG tablet, Take 1 tablet Daily for Cholesterol .  bisoprolol (ZEBETA) 10 MG tablet, TAKE 1 TABLET DAILY FOR BLOOD  PRESSURE .  ezetimibe (ZETIA) 10 MG tablet, Take 1 tablet Daily forCholesterol .  tadalafil (CIALIS) 20 MG tablet, Take 1/2 to 1 tablet every 2-3 days as needed (Patient taking differently: Take 10-20 mg by mouth daily as needed for erectile dysfunction. Take 1/2 to 1 tablet every 2-3 days as needed) .  telmisartan (MICARDIS) 40 MG tablet, TAKE 1 TABLET BY MOUTH EVERY DAY   Current Outpatient Medications (Respiratory):  .  ciclesonide (OMNARIS) 50 MCG/ACT nasal spray, Place 2 sprays into both nostrils daily as needed for allergies.        Current Outpatient Medications (Other):  .  clotrimazole-betamethasone (LOTRISONE) cream, APPLY 3 TIMES A DAY AS NEEDED .  Continuous Blood Gluc Sensor (FREESTYLE LIBRE 14 DAY SENSOR) MISC, USE AS DIRECTED .  Continuous Blood Gluc Sensor (FREESTYLE LIBRE SENSOR SYSTEM) MISC, 1 device with sensors DX Z79.4 insulin dependent diabetes .  Continuous Blood Gluc Sensor MISC, 1 each by Does not apply route as directed. Use as directed every 10 days. May dispense FreeStyle [employer] or similar. .  fluconazole (DIFLUCAN) 150 MG tablet, TAKE 1 TABLET DAILY FOR YEAST INFECTION .  glucose blood (FREESTYLE LITE) test  strip, Check blood sugar 3 times a day or as directed. DX-E11.22 .  Insulin Syringe-Needle U-100 (BD VEO INSULIN SYRINGE U/F) 31G X 15/64" 0.5 ML MISC, USE 2 X /DAY FOR INSULIN/DISPENSE INSURANCE PREFERENCE DX: E11.22 .  ketoconazole (NIZORAL) 2 % shampoo, APPLY 1 APPLICATION TOPICALLY 2 (TWO) TIMES A WEEK. APPLY & SHAMPOO 2 X /WEEK .  senna-docusate (SENOKOT-S) 8.6-50 MG tablet, Take by mouth. .  Vitamin D, Ergocalciferol, (DRISDOL) 1.25 MG (50000 UNIT) CAPS capsule, Take 1 capsule 2 x /week on Mon - Thurs   Allergies:  Allergies  Allergen Reactions  . 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   Surgical History:  He  has a past surgical history that includes Wisdom tooth extraction; Hernia repair; Colonoscopy; and Rotator cuff repair (Left, 2017 sept 5).   Family History:  His family history includes Cancer (age of onset: 12) in his father; Diabetes in his brother, father, and mother; Hyperlipidemia in his father and mother; Hypertension in his brother, father, and mother.   Social History:   reports that he quit smoking about 19 years ago. His smoking use included cigarettes. He has a 2.40 pack-year smoking history. He has never used smokeless tobacco. He reports current alcohol use. He reports that he does not use drugs.   Review of Systems:  Review of Systems  Constitutional: Negative.   HENT: Negative.   Eyes: Negative.   Respiratory: Negative.   Cardiovascular: Negative.   Gastrointestinal: Negative.   Genitourinary: Negative.   Musculoskeletal: Negative.   Skin: Negative.     Physical Exam: Estimated body mass index is 37.02 kg/m as calculated from the following:   Height as of 04/08/20: 5\' 10"  (1.778 m).   Weight as of this encounter: 258 lb (117 kg). BP 132/72   Pulse 63   Temp (!) 97.1 F (36.2 C)   Resp 12   Wt 258 lb (117 kg)   BMI 37.02 kg/m  General Appearance: Well nourished, in no apparent distress.  Eyes: PERRLA, EOMs, conjunctiva no swelling or erythema, normal fundi and vessels.   Sinuses: No Frontal/maxillary tenderness  ENT/Mouth: Ext aud canals clear, normal light reflex with TMs without erythema, bulging. Good dentition. No erythema, swelling, or exudate on post pharynx. Tonsils not swollen or erythematous. Hearing normal.  Neck: Supple, thyroid normal. No bruits  Respiratory: Respiratory effort normal, BS equal bilaterally without rales, rhonchi, wheezing or stridor.  Cardio: RRR without murmurs, prominent S2, no rubs or gallops. Brisk peripheral pulses without edema.  Chest: symmetric, with normal excursions and percussion.  Abdomen: Soft, nontender, no guarding, rebound, hernias, masses, or organomegaly.  Lymphatics: Non tender without lymphadenopathy.  Musculoskeletal: Full ROM all peripheral extremities,5/5 strength, and normal gait.  Skin: Warm, dry without rashes, lesions, ecchymosis. Neuro: Cranial nerves intact, reflexes equal bilaterally. Normal muscle tone, no cerebellar symptoms. Sensation intact.  Psych: Awake and oriented X 3, normal affect, Insight and Judgment appropriate.  Vicie Mutters 10:00 AM [facility]

## 2020-07-30 LAB — COMPLETE METABOLIC PANEL WITH GFR
AG Ratio: 1.7 (calc) (ref 1.0–2.5)
ALT: 14 U/L (ref 9–46)
AST: 11 U/L (ref 10–35)
Albumin: 4.2 g/dL (ref 3.6–5.1)
Alkaline phosphatase (APISO): 72 U/L (ref 35–144)
BUN: 14 mg/dL (ref 7–25)
CO2: 29 mmol/L (ref 20–32)
Calcium: 9.5 mg/dL (ref 8.6–10.3)
Chloride: 103 mmol/L (ref 98–110)
Creat: 1.03 mg/dL (ref 0.70–1.33)
GFR, Est African American: 93 mL/min/{1.73_m2} (ref >=60)
GFR, Est Non African American: 80 mL/min/{1.73_m2} (ref >=60)
Globulin: 2.5 g/dL (calc) (ref 1.9–3.7)
Glucose, Bld: 120 mg/dL — ABNORMAL HIGH (ref 65–99)
Potassium: 4.5 mmol/L (ref 3.5–5.3)
Sodium: 139 mmol/L (ref 135–146)
Total Bilirubin: 0.4 mg/dL (ref 0.2–1.2)
Total Protein: 6.7 g/dL (ref 6.1–8.1)

## 2020-07-30 LAB — LIPID PANEL
Cholesterol: 169 mg/dL (ref <200)
HDL: 36 mg/dL — ABNORMAL LOW (ref >=40)
LDL Cholesterol (Calc): 116 mg/dL (calc) — ABNORMAL HIGH
Non-HDL Cholesterol (Calc): 133 mg/dL (calc) — ABNORMAL HIGH (ref <130)
Total CHOL/HDL Ratio: 4.7 (calc) (ref <5.0)
Triglycerides: 80 mg/dL (ref <150)

## 2020-07-30 LAB — CBC WITH DIFFERENTIAL/PLATELET
Absolute Monocytes: 675 cells/uL (ref 200–950)
Basophils Absolute: 32 cells/uL (ref 0–200)
Basophils Relative: 0.6 %
Eosinophils Absolute: 308 cells/uL (ref 15–500)
Eosinophils Relative: 5.7 %
HCT: 44.9 % (ref 38.5–50.0)
Hemoglobin: 14.8 g/dL (ref 13.2–17.1)
Lymphs Abs: 1841 cells/uL (ref 850–3900)
MCH: 27.6 pg (ref 27.0–33.0)
MCHC: 33 g/dL (ref 32.0–36.0)
MCV: 83.6 fL (ref 80.0–100.0)
MPV: 9.5 fL (ref 7.5–12.5)
Monocytes Relative: 12.5 %
Neutro Abs: 2543 cells/uL (ref 1500–7800)
Neutrophils Relative %: 47.1 %
Platelets: 334 10*3/uL (ref 140–400)
RBC: 5.37 10*6/uL (ref 4.20–5.80)
RDW: 13.1 % (ref 11.0–15.0)
Total Lymphocyte: 34.1 %
WBC: 5.4 10*3/uL (ref 3.8–10.8)

## 2020-07-30 LAB — HEMOGLOBIN A1C
Hgb A1c MFr Bld: 9.1 % of total Hgb — ABNORMAL HIGH (ref <5.7)
Mean Plasma Glucose: 214 (calc)
eAG (mmol/L): 11.9 (calc)

## 2020-07-30 LAB — MAGNESIUM: Magnesium: 2 mg/dL (ref 1.5–2.5)

## 2020-07-30 LAB — VITAMIN D 25 HYDROXY (VIT D DEFICIENCY, FRACTURES): Vit D, 25-Hydroxy: 78 ng/mL (ref 30–100)

## 2020-07-30 LAB — TSH: TSH: 1.15 mIU/L (ref 0.40–4.50)

## 2020-08-01 DIAGNOSIS — N5201 Erectile dysfunction due to arterial insufficiency: Secondary | ICD-10-CM | POA: Diagnosis not present

## 2020-08-01 DIAGNOSIS — N401 Enlarged prostate with lower urinary tract symptoms: Secondary | ICD-10-CM | POA: Diagnosis not present

## 2020-08-01 DIAGNOSIS — N210 Calculus in bladder: Secondary | ICD-10-CM | POA: Diagnosis not present

## 2020-08-01 DIAGNOSIS — R351 Nocturia: Secondary | ICD-10-CM | POA: Diagnosis not present

## 2020-08-08 DIAGNOSIS — N210 Calculus in bladder: Secondary | ICD-10-CM | POA: Diagnosis not present

## 2020-08-16 DIAGNOSIS — R82994 Hypercalciuria: Secondary | ICD-10-CM | POA: Diagnosis not present

## 2020-08-16 DIAGNOSIS — N210 Calculus in bladder: Secondary | ICD-10-CM | POA: Diagnosis not present

## 2020-08-22 ENCOUNTER — Other Ambulatory Visit: Payer: Self-pay | Admitting: Adult Health

## 2020-08-22 DIAGNOSIS — N183 Chronic kidney disease, stage 3 unspecified: Secondary | ICD-10-CM

## 2020-08-22 DIAGNOSIS — E119 Type 2 diabetes mellitus without complications: Secondary | ICD-10-CM

## 2020-08-22 DIAGNOSIS — I10 Essential (primary) hypertension: Secondary | ICD-10-CM

## 2020-08-22 DIAGNOSIS — E1159 Type 2 diabetes mellitus with other circulatory complications: Secondary | ICD-10-CM

## 2020-08-22 DIAGNOSIS — N182 Chronic kidney disease, stage 2 (mild): Secondary | ICD-10-CM

## 2020-08-22 DIAGNOSIS — Z794 Long term (current) use of insulin: Secondary | ICD-10-CM

## 2020-08-22 DIAGNOSIS — E1122 Type 2 diabetes mellitus with diabetic chronic kidney disease: Secondary | ICD-10-CM

## 2020-09-01 DIAGNOSIS — E119 Type 2 diabetes mellitus without complications: Secondary | ICD-10-CM | POA: Diagnosis not present

## 2020-09-01 DIAGNOSIS — I10 Essential (primary) hypertension: Secondary | ICD-10-CM | POA: Diagnosis not present

## 2020-09-01 DIAGNOSIS — S37019A Minor contusion of unspecified kidney, initial encounter: Secondary | ICD-10-CM | POA: Diagnosis not present

## 2020-09-01 DIAGNOSIS — N179 Acute kidney failure, unspecified: Secondary | ICD-10-CM | POA: Diagnosis not present

## 2020-09-06 DIAGNOSIS — R351 Nocturia: Secondary | ICD-10-CM | POA: Diagnosis not present

## 2020-09-06 DIAGNOSIS — N401 Enlarged prostate with lower urinary tract symptoms: Secondary | ICD-10-CM | POA: Diagnosis not present

## 2020-09-23 DIAGNOSIS — Z23 Encounter for immunization: Secondary | ICD-10-CM | POA: Diagnosis not present

## 2020-11-01 ENCOUNTER — Other Ambulatory Visit: Payer: Self-pay | Admitting: Internal Medicine

## 2020-11-01 DIAGNOSIS — N183 Chronic kidney disease, stage 3 unspecified: Secondary | ICD-10-CM

## 2020-11-01 DIAGNOSIS — Z794 Long term (current) use of insulin: Secondary | ICD-10-CM

## 2020-11-01 DIAGNOSIS — E119 Type 2 diabetes mellitus without complications: Secondary | ICD-10-CM

## 2020-11-01 DIAGNOSIS — E1122 Type 2 diabetes mellitus with diabetic chronic kidney disease: Secondary | ICD-10-CM

## 2020-11-01 DIAGNOSIS — I10 Essential (primary) hypertension: Secondary | ICD-10-CM

## 2020-11-01 DIAGNOSIS — N182 Chronic kidney disease, stage 2 (mild): Secondary | ICD-10-CM

## 2020-11-01 DIAGNOSIS — E1159 Type 2 diabetes mellitus with other circulatory complications: Secondary | ICD-10-CM

## 2020-11-02 ENCOUNTER — Ambulatory Visit: Payer: BC Managed Care – PPO | Admitting: Adult Health Nurse Practitioner
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
## 2020-11-08 ENCOUNTER — Other Ambulatory Visit: Payer: Self-pay | Admitting: Adult Health Nurse Practitioner

## 2020-11-08 ENCOUNTER — Ambulatory Visit: Payer: BC Managed Care – PPO | Admitting: Adult Health Nurse Practitioner

## 2020-11-08 ENCOUNTER — Other Ambulatory Visit: Payer: Self-pay

## 2020-11-08 ENCOUNTER — Encounter: Payer: Self-pay | Admitting: Adult Health Nurse Practitioner

## 2020-11-08 VITALS — BP 148/92 | HR 76 | Temp 97.5°F | Wt 258.0 lb

## 2020-11-08 DIAGNOSIS — I10 Essential (primary) hypertension: Secondary | ICD-10-CM | POA: Diagnosis not present

## 2020-11-08 DIAGNOSIS — Z9989 Dependence on other enabling machines and devices: Secondary | ICD-10-CM

## 2020-11-08 DIAGNOSIS — E785 Hyperlipidemia, unspecified: Secondary | ICD-10-CM

## 2020-11-08 DIAGNOSIS — I700 Atherosclerosis of aorta: Secondary | ICD-10-CM | POA: Diagnosis not present

## 2020-11-08 DIAGNOSIS — G4733 Obstructive sleep apnea (adult) (pediatric): Secondary | ICD-10-CM

## 2020-11-08 DIAGNOSIS — E119 Type 2 diabetes mellitus without complications: Secondary | ICD-10-CM | POA: Diagnosis not present

## 2020-11-08 DIAGNOSIS — Z9119 Patient's noncompliance with other medical treatment and regimen: Secondary | ICD-10-CM

## 2020-11-08 DIAGNOSIS — E1122 Type 2 diabetes mellitus with diabetic chronic kidney disease: Secondary | ICD-10-CM | POA: Diagnosis not present

## 2020-11-08 DIAGNOSIS — Z91199 Patient's noncompliance with other medical treatment and regimen due to unspecified reason: Secondary | ICD-10-CM

## 2020-11-08 DIAGNOSIS — E1159 Type 2 diabetes mellitus with other circulatory complications: Secondary | ICD-10-CM | POA: Diagnosis not present

## 2020-11-08 DIAGNOSIS — N183 Chronic kidney disease, stage 3 unspecified: Secondary | ICD-10-CM

## 2020-11-08 DIAGNOSIS — Z79899 Other long term (current) drug therapy: Secondary | ICD-10-CM

## 2020-11-08 DIAGNOSIS — E349 Endocrine disorder, unspecified: Secondary | ICD-10-CM

## 2020-11-08 DIAGNOSIS — E559 Vitamin D deficiency, unspecified: Secondary | ICD-10-CM

## 2020-11-08 DIAGNOSIS — E1169 Type 2 diabetes mellitus with other specified complication: Secondary | ICD-10-CM | POA: Diagnosis not present

## 2020-11-08 DIAGNOSIS — E291 Testicular hypofunction: Secondary | ICD-10-CM

## 2020-11-08 DIAGNOSIS — Z794 Long term (current) use of insulin: Secondary | ICD-10-CM

## 2020-11-08 DIAGNOSIS — I708 Atherosclerosis of other arteries: Secondary | ICD-10-CM

## 2020-11-08 DIAGNOSIS — N182 Chronic kidney disease, stage 2 (mild): Secondary | ICD-10-CM

## 2020-11-08 DIAGNOSIS — N521 Erectile dysfunction due to diseases classified elsewhere: Secondary | ICD-10-CM

## 2020-11-08 MED ORDER — TESTOSTERONE CYPIONATE 200 MG/ML IM SOLN: INTRAMUSCULAR | 2 refills | Status: DC

## 2020-11-08 NOTE — Patient Instructions (Signed)
  We will contact you in 1-3 days with your lab results.  Continue to keep close contact with your friend regarding your recent experience.  Please contact us if you are not sleep, or those around you notice a change in your behavior.  Glad to  Hear you are not resorting to alcohol as this will worsen your personal health.  Continue to get back on tract with your medications to improve your health.  Walking is excellent for exercise as well as our mental health.  Headspace is an excellent application you can download.    GENERAL HEALTH GOALS  Know what a healthy weight is for you (roughly BMI <25) and aim to maintain this  Aim for 7+ servings of fruits and vegetables daily  70-80+ fluid ounces of water or unsweet tea for healthy kidneys  Limit to max 1 drink of alcohol per day; avoid smoking/tobacco  Limit animal fats in diet for cholesterol and heart health - choose grass fed whenever available  Avoid highly processed foods, and foods high in saturated/trans fats  Aim for low stress - take time to unwind and care for your mental health  Aim for 150 min of moderate intensity exercise weekly for heart health, and weights twice weekly for bone health  Aim for 7-9 hours of sleep daily

## 2020-11-09 LAB — CBC WITH DIFFERENTIAL/PLATELET
Absolute Monocytes: 670 cells/uL (ref 200–950)
Basophils Absolute: 27 cells/uL (ref 0–200)
Basophils Relative: 0.4 %
Eosinophils Absolute: 281 cells/uL (ref 15–500)
Eosinophils Relative: 4.2 %
HCT: 43.7 % (ref 38.5–50.0)
Hemoglobin: 14.7 g/dL (ref 13.2–17.1)
Lymphs Abs: 2010 cells/uL (ref 850–3900)
MCH: 27.9 pg (ref 27.0–33.0)
MCHC: 33.6 g/dL (ref 32.0–36.0)
MCV: 82.9 fL (ref 80.0–100.0)
MPV: 9.4 fL (ref 7.5–12.5)
Monocytes Relative: 10 %
Neutro Abs: 3712 cells/uL (ref 1500–7800)
Neutrophils Relative %: 55.4 %
Platelets: 344 10*3/uL (ref 140–400)
RBC: 5.27 10*6/uL (ref 4.20–5.80)
RDW: 13.7 % (ref 11.0–15.0)
Total Lymphocyte: 30 %
WBC: 6.7 10*3/uL (ref 3.8–10.8)

## 2020-11-09 LAB — HEMOGLOBIN A1C
Hgb A1c MFr Bld: 11.7 % of total Hgb — ABNORMAL HIGH (ref <5.7)
Mean Plasma Glucose: 289 (calc)
eAG (mmol/L): 16 (calc)

## 2020-11-09 LAB — COMPLETE METABOLIC PANEL WITH GFR
AG Ratio: 1.4 (calc) (ref 1.0–2.5)
ALT: 20 U/L (ref 9–46)
AST: 14 U/L (ref 10–35)
Albumin: 4.2 g/dL (ref 3.6–5.1)
Alkaline phosphatase (APISO): 85 U/L (ref 35–144)
BUN: 21 mg/dL (ref 7–25)
CO2: 28 mmol/L (ref 20–32)
Calcium: 10 mg/dL (ref 8.6–10.3)
Chloride: 98 mmol/L (ref 98–110)
Creat: 1.18 mg/dL (ref 0.70–1.33)
GFR, Est African American: 78 mL/min/{1.73_m2} (ref >=60)
GFR, Est Non African American: 68 mL/min/{1.73_m2} (ref >=60)
Globulin: 3 g/dL (calc) (ref 1.9–3.7)
Glucose, Bld: 285 mg/dL — ABNORMAL HIGH (ref 65–99)
Potassium: 4.5 mmol/L (ref 3.5–5.3)
Sodium: 134 mmol/L — ABNORMAL LOW (ref 135–146)
Total Bilirubin: 0.5 mg/dL (ref 0.2–1.2)
Total Protein: 7.2 g/dL (ref 6.1–8.1)

## 2020-11-09 LAB — LIPID PANEL
Cholesterol: 145 mg/dL (ref <200)
HDL: 31 mg/dL — ABNORMAL LOW (ref >=40)
LDL Cholesterol (Calc): 88 mg/dL (calc)
Non-HDL Cholesterol (Calc): 114 mg/dL (calc) (ref <130)
Total CHOL/HDL Ratio: 4.7 (calc) (ref <5.0)
Triglycerides: 165 mg/dL — ABNORMAL HIGH (ref <150)

## 2020-11-14 ENCOUNTER — Other Ambulatory Visit: Payer: Self-pay | Admitting: Adult Health Nurse Practitioner

## 2020-11-14 DIAGNOSIS — Z794 Long term (current) use of insulin: Secondary | ICD-10-CM

## 2020-11-14 DIAGNOSIS — E119 Type 2 diabetes mellitus without complications: Secondary | ICD-10-CM

## 2020-11-14 MED ORDER — GLIPIZIDE 5 MG PO TABS: 5.0000 mg | ORAL_TABLET | Freq: Two times a day (BID) | ORAL | 11 refills | Status: DC

## 2020-11-14 NOTE — Progress Notes (Signed)
Added Glipizide 5mg  BID.  Discussed medication and side effects whith patient.  Garnet Sierras, Laqueta Jean, DNP [facility] 11/14/2020  8:20 AM

## 2020-11-17 ENCOUNTER — Other Ambulatory Visit: Payer: Self-pay | Admitting: Internal Medicine

## 2020-12-15 DIAGNOSIS — Z03818 Encounter for observation for suspected exposure to other biological agents ruled out: Secondary | ICD-10-CM | POA: Diagnosis not present

## 2020-12-20 ENCOUNTER — Other Ambulatory Visit: Payer: Self-pay | Admitting: Adult Health

## 2020-12-20 DIAGNOSIS — Z1152 Encounter for screening for COVID-19: Secondary | ICD-10-CM | POA: Diagnosis not present

## 2020-12-21 ENCOUNTER — Other Ambulatory Visit: Payer: Self-pay | Admitting: Internal Medicine

## 2020-12-21 DIAGNOSIS — E1122 Type 2 diabetes mellitus with diabetic chronic kidney disease: Secondary | ICD-10-CM

## 2020-12-21 DIAGNOSIS — N182 Chronic kidney disease, stage 2 (mild): Secondary | ICD-10-CM

## 2020-12-21 DIAGNOSIS — Z794 Long term (current) use of insulin: Secondary | ICD-10-CM

## 2020-12-21 DIAGNOSIS — I10 Essential (primary) hypertension: Secondary | ICD-10-CM

## 2021-01-28 ENCOUNTER — Other Ambulatory Visit: Payer: Self-pay | Admitting: Internal Medicine

## 2021-01-28 DIAGNOSIS — I10 Essential (primary) hypertension: Secondary | ICD-10-CM

## 2021-01-28 DIAGNOSIS — N183 Chronic kidney disease, stage 3 unspecified: Secondary | ICD-10-CM

## 2021-01-28 DIAGNOSIS — N182 Chronic kidney disease, stage 2 (mild): Secondary | ICD-10-CM

## 2021-01-28 DIAGNOSIS — E1122 Type 2 diabetes mellitus with diabetic chronic kidney disease: Secondary | ICD-10-CM

## 2021-01-28 DIAGNOSIS — E1159 Type 2 diabetes mellitus with other circulatory complications: Secondary | ICD-10-CM

## 2021-01-28 DIAGNOSIS — Z794 Long term (current) use of insulin: Secondary | ICD-10-CM

## 2021-01-28 DIAGNOSIS — N521 Erectile dysfunction due to diseases classified elsewhere: Secondary | ICD-10-CM

## 2021-01-28 DIAGNOSIS — E119 Type 2 diabetes mellitus without complications: Secondary | ICD-10-CM

## 2021-02-12 ENCOUNTER — Other Ambulatory Visit: Payer: Self-pay | Admitting: Adult Health Nurse Practitioner

## 2021-02-13 ENCOUNTER — Ambulatory Visit: Payer: BC Managed Care – PPO | Admitting: Adult Health Nurse Practitioner

## 2021-02-20 ENCOUNTER — Other Ambulatory Visit: Payer: Self-pay | Admitting: Internal Medicine

## 2021-02-20 DIAGNOSIS — N182 Chronic kidney disease, stage 2 (mild): Secondary | ICD-10-CM

## 2021-02-20 DIAGNOSIS — E119 Type 2 diabetes mellitus without complications: Secondary | ICD-10-CM

## 2021-02-20 DIAGNOSIS — Z794 Long term (current) use of insulin: Secondary | ICD-10-CM

## 2021-02-20 DIAGNOSIS — E1122 Type 2 diabetes mellitus with diabetic chronic kidney disease: Secondary | ICD-10-CM

## 2021-02-20 DIAGNOSIS — I10 Essential (primary) hypertension: Secondary | ICD-10-CM

## 2021-02-20 DIAGNOSIS — E1159 Type 2 diabetes mellitus with other circulatory complications: Secondary | ICD-10-CM

## 2021-02-20 DIAGNOSIS — N183 Chronic kidney disease, stage 3 unspecified: Secondary | ICD-10-CM

## 2021-02-23 ENCOUNTER — Encounter: Payer: Self-pay | Admitting: Adult Health Nurse Practitioner

## 2021-02-23 ENCOUNTER — Other Ambulatory Visit: Payer: Self-pay

## 2021-02-23 ENCOUNTER — Ambulatory Visit: Payer: BC Managed Care – PPO | Admitting: Adult Health Nurse Practitioner

## 2021-02-23 VITALS — BP 136/80 | HR 63 | Temp 97.7°F | Wt 257.0 lb

## 2021-02-23 DIAGNOSIS — I10 Essential (primary) hypertension: Secondary | ICD-10-CM

## 2021-02-23 DIAGNOSIS — Z794 Long term (current) use of insulin: Secondary | ICD-10-CM

## 2021-02-23 DIAGNOSIS — E291 Testicular hypofunction: Secondary | ICD-10-CM

## 2021-02-23 DIAGNOSIS — N183 Chronic kidney disease, stage 3 unspecified: Secondary | ICD-10-CM

## 2021-02-23 DIAGNOSIS — R300 Dysuria: Secondary | ICD-10-CM

## 2021-02-23 DIAGNOSIS — N521 Erectile dysfunction due to diseases classified elsewhere: Secondary | ICD-10-CM

## 2021-02-23 DIAGNOSIS — E785 Hyperlipidemia, unspecified: Secondary | ICD-10-CM

## 2021-02-23 DIAGNOSIS — I708 Atherosclerosis of other arteries: Secondary | ICD-10-CM

## 2021-02-23 DIAGNOSIS — E1165 Type 2 diabetes mellitus with hyperglycemia: Secondary | ICD-10-CM

## 2021-02-23 DIAGNOSIS — E349 Endocrine disorder, unspecified: Secondary | ICD-10-CM

## 2021-02-23 DIAGNOSIS — Z79899 Other long term (current) drug therapy: Secondary | ICD-10-CM

## 2021-02-23 DIAGNOSIS — Z91199 Patient's noncompliance with other medical treatment and regimen due to unspecified reason: Secondary | ICD-10-CM

## 2021-02-23 DIAGNOSIS — N182 Chronic kidney disease, stage 2 (mild): Secondary | ICD-10-CM

## 2021-02-23 DIAGNOSIS — E1159 Type 2 diabetes mellitus with other circulatory complications: Secondary | ICD-10-CM

## 2021-02-23 DIAGNOSIS — Z9119 Patient's noncompliance with other medical treatment and regimen: Secondary | ICD-10-CM

## 2021-02-23 DIAGNOSIS — G4733 Obstructive sleep apnea (adult) (pediatric): Secondary | ICD-10-CM

## 2021-02-23 DIAGNOSIS — E1169 Type 2 diabetes mellitus with other specified complication: Secondary | ICD-10-CM | POA: Diagnosis not present

## 2021-02-23 DIAGNOSIS — E1122 Type 2 diabetes mellitus with diabetic chronic kidney disease: Secondary | ICD-10-CM

## 2021-02-23 DIAGNOSIS — I700 Atherosclerosis of aorta: Secondary | ICD-10-CM

## 2021-02-23 DIAGNOSIS — E559 Vitamin D deficiency, unspecified: Secondary | ICD-10-CM

## 2021-02-23 MED ORDER — TESTOSTERONE CYPIONATE 200 MG/ML IM SOLN
200.0000 mg | INTRAMUSCULAR | Status: DC
Start: 2021-02-23 — End: 2022-04-14
  Administered 2021-02-23 – 2021-05-31 (×2): 200 mg via INTRAMUSCULAR

## 2021-02-23 MED ORDER — FLUCONAZOLE 150 MG PO TABS: ORAL_TABLET | ORAL | 0 refills | Status: DC

## 2021-02-23 NOTE — Patient Instructions (Addendum)
Colace, Docusate Sodium 100mg , take one tablet daily to help soften the stool.  IF it is too much then try every other day.    Ask insurance and pharmacy about shingrix - it is a 2 part shot that we will not be getting in the office.   Suggest getting AFTER covid vaccines, have to wait at least a month This shot can make you feel bad due to such good immune response it can trigger some inflammation so take tylenol or aleve day of or day after and plan on resting.   Can go to [email or web address] for more information  Shingrix Vaccination  Two vaccines are licensed and recommended to prevent shingles in the [place] Zoster vaccine live (ZVL, Zostavax) has been in use since 2006. Recombinant zoster vaccine (RZV, Shingrix), has been in use since 2017 and is recommended by ACIP as the preferred shingles vaccine.  What Everyone Should Know about Shingles Vaccine (Shingrix) One of the Recommended Vaccines by Disease Shingles vaccination is the only way to protect against shingles and postherpetic neuralgia (PHN), the most common complication from shingles. CDC recommends that healthy adults 50 years and older get two doses of the shingles vaccine called Shingrix (recombinant zoster vaccine), separated by 2 to 6 months, to prevent shingles and the complications from the disease. Your doctor or pharmacist can give you Shingrix as a shot in your upper arm. Shingrix provides strong protection against shingles and PHN. Two doses of Shingrix is more than 90% effective at preventing shingles and PHN. Protection stays above 85% for at least the first four years after you get vaccinated. Shingrix is the preferred vaccine, over Zostavax (zoster vaccine live), a shingles vaccine in use since 2006. Zostavax may still be used to prevent shingles in healthy adults 60 years and older. For example, you could use Zostavax if a person is allergic to Shingrix, prefers Zostavax,  or requests immediate vaccination and Shingrix is unavailable. Who Should Get Shingrix? Healthy adults 50 years and older should get two doses of Shingrix, separated by 2 to 6 months. You should get Shingrix even if in the past you . had shingles  . received Zostavax  . are not sure if you had chickenpox There is no maximum age for getting Shingrix. If you had shingles in the past, you can get Shingrix to help prevent future occurrences of the disease. There is no specific length of time that you need to wait after having shingles before you can receive Shingrix, but generally you should make sure the shingles rash has gone away before getting vaccinated. You can get Shingrix whether or not you remember having had chickenpox in the past. Studies show that more than 99% of Americans 40 years and older have had chickenpox, even if they don't remember having the disease. Chickenpox and shingles are related because they are caused by the same virus (varicella zoster virus). After a person recovers from chickenpox, the virus stays dormant (inactive) in the body. It can reactivate years later and cause shingles. If you had Zostavax in the recent past, you should wait at least eight weeks before getting Shingrix. Talk to your healthcare provider to determine the best time to get Shingrix. Shingrix is available in [employer] and pharmacies. To find doctor's offices or pharmacies near you that offer the vaccine, visit HealthMap Vaccine FinderExternal. If you have questions about Shingrix, talk with your healthcare provider. Vaccine for Those 96 Years and Older  Shingrix reduces the risk of shingles and PHN  by more than 90% in people 21 and older. CDC recommends the vaccine for healthy adults 15 and older.  Who Should Not Get Shingrix? You should not get Shingrix if you: . have ever had a severe allergic reaction to any component of the vaccine or after a dose of Shingrix  . tested negative for  immunity to varicella zoster virus. If you test negative, you should get chickenpox vaccine.  . currently have shingles  . currently are pregnant or breastfeeding. Women who are pregnant or breastfeeding should wait to get Shingrix.  Marland Kitchen receive specific antiviral drugs (acyclovir, famciclovir, or valacyclovir) 24 hours before vaccination (avoid use of these antiviral drugs for 14 days after vaccination)- zoster vaccine live only If you have a minor acute (starts suddenly) illness, such as a cold, you may get Shingrix. But if you have a moderate or severe acute illness, you should usually wait until you recover before getting the vaccine. This includes anyone with a temperature of 101.4F or higher. The side effects of the Shingrix are temporary, and usually last 2 to 3 days. While you may experience pain for a few days after getting Shingrix, the pain will be less severe than having shingles and the complications from the disease. How Well Does Shingrix Work? Two doses of Shingrix provides strong protection against shingles and postherpetic neuralgia (PHN), the most common complication of shingles. . In adults 49 to 59 years old who got two doses, Shingrix was 97% effective in preventing shingles; among adults 70 years and older, Shingrix was 91% effective.  . In adults 60 to 59 years old who got two doses, Shingrix was 91% effective in preventing PHN; among adults 70 years and older, Shingrix was 89% effective. Shingrix protection remained high (more than 85%) in people 70 years and older throughout the four years following vaccination. Since your risk of shingles and PHN increases as you get older, it is important to have strong protection against shingles in your older years. Top of Page  What Are the Possible Side Effects of Shingrix? Studies show that Shingrix is safe. The vaccine helps your body create a strong defense against shingles. As a result, you are likely to have temporary side effects  from getting the shots. The side effects may affect your ability to do normal daily activities for 2 to 3 days. Most people got a sore arm with mild or moderate pain after getting Shingrix, and some also had redness and swelling where they got the shot. Some people felt tired, had muscle pain, a headache, shivering, fever, stomach pain, or nausea. About 1 out of 6 people who got Shingrix experienced side effects that prevented them from doing regular activities. Symptoms went away on their own in about 2 to 3 days. Side effects were more common in younger people. You might have a reaction to the first or second dose of Shingrix, or both doses. If you experience side effects, you may choose to take over-the-counter pain medicine such as ibuprofen or acetaminophen. If you experience side effects from Shingrix, you should report them to the Vaccine Adverse Event Reporting System (VAERS). Your doctor might file this report, or you can do it yourself through the VAERS websiteExternal, or by calling [phone]. If you have any questions about side effects from Shingrix, talk with your doctor. The shingles vaccine does not contain thimerosal (a preservative containing mercury). Top of Page  When Should I See a Doctor Because of the Side Effects I Experience From Shingrix?  In clinical trials, Shingrix was not associated with serious adverse events. In fact, serious side effects from vaccines are extremely rare. For example, for every 1 million doses of a vaccine given, only one or two people may have a severe allergic reaction. Signs of an allergic reaction happen within minutes or hours after vaccination and include hives, swelling of the face and throat, difficulty breathing, a fast heartbeat, dizziness, or weakness. If you experience these or any other life-threatening symptoms, see a doctor right away. Shingrix causes a strong response in your immune system, so it may produce short-term side effects more  intense than you are used to from other vaccines. These side effects can be uncomfortable, but they are expected and usually go away on their own in 2 or 3 days. Top of Page  How Can I Pay For Shingrix? There are several ways shingles vaccine may be paid for: Medicare . Medicare Part D plans cover the shingles vaccine, but there may be a cost to you depending on your plan. There may be a copay for the vaccine, or you may need to pay in full then get reimbursed for a certain amount.  . Medicare Part B does not cover the shingles vaccine. Medicaid . Medicaid may or may not cover the vaccine. Contact your insurer to find out. Private health insurance . Many private health insurance plans will cover the vaccine, but there may be a cost to you depending on your plan. Contact your insurer to find out. Vaccine assistance programs . Some pharmaceutical companies provide vaccines to eligible adults who cannot afford them. You may want to check with the vaccine manufacturer, [employer], about Shingrix. If you do not currently have health insurance, learn more about affordable health coverage optionsExternal. To find doctor's offices or pharmacies near you that offer the vaccine, visit HealthMap Vaccine FinderExternal.
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
## 2021-02-24 LAB — CBC WITH DIFFERENTIAL/PLATELET
Absolute Monocytes: 703 cells/uL (ref 200–950)
Basophils Absolute: 28 cells/uL (ref 0–200)
Basophils Relative: 0.4 %
Eosinophils Absolute: 199 cells/uL (ref 15–500)
Eosinophils Relative: 2.8 %
HCT: 44.3 % (ref 38.5–50.0)
Hemoglobin: 13.9 g/dL (ref 13.2–17.1)
Lymphs Abs: 2386 cells/uL (ref 850–3900)
MCH: 26.6 pg — ABNORMAL LOW (ref 27.0–33.0)
MCHC: 31.4 g/dL — ABNORMAL LOW (ref 32.0–36.0)
MCV: 84.9 fL (ref 80.0–100.0)
MPV: 10.1 fL (ref 7.5–12.5)
Monocytes Relative: 9.9 %
Neutro Abs: 3784 cells/uL (ref 1500–7800)
Neutrophils Relative %: 53.3 %
Platelets: 357 10*3/uL (ref 140–400)
RBC: 5.22 10*6/uL (ref 4.20–5.80)
RDW: 13.5 % (ref 11.0–15.0)
Total Lymphocyte: 33.6 %
WBC: 7.1 10*3/uL (ref 3.8–10.8)

## 2021-02-24 LAB — LIPID PANEL
Cholesterol: 126 mg/dL (ref <200)
HDL: 31 mg/dL — ABNORMAL LOW (ref >=40)
LDL Cholesterol (Calc): 74 mg/dL (calc)
Non-HDL Cholesterol (Calc): 95 mg/dL (calc) (ref <130)
Total CHOL/HDL Ratio: 4.1 (calc) (ref <5.0)
Triglycerides: 119 mg/dL (ref <150)

## 2021-02-24 LAB — COMPLETE METABOLIC PANEL WITH GFR
AG Ratio: 1.5 (calc) (ref 1.0–2.5)
ALT: 10 U/L (ref 9–46)
AST: 10 U/L (ref 10–35)
Albumin: 4.1 g/dL (ref 3.6–5.1)
Alkaline phosphatase (APISO): 78 U/L (ref 35–144)
BUN: 22 mg/dL (ref 7–25)
CO2: 29 mmol/L (ref 20–32)
Calcium: 9.7 mg/dL (ref 8.6–10.3)
Chloride: 100 mmol/L (ref 98–110)
Creat: 1.22 mg/dL (ref 0.70–1.33)
GFR, Est African American: 75 mL/min/{1.73_m2} (ref >=60)
GFR, Est Non African American: 65 mL/min/{1.73_m2} (ref >=60)
Globulin: 2.7 g/dL (calc) (ref 1.9–3.7)
Glucose, Bld: 228 mg/dL — ABNORMAL HIGH (ref 65–99)
Potassium: 4.6 mmol/L (ref 3.5–5.3)
Sodium: 137 mmol/L (ref 135–146)
Total Bilirubin: 0.3 mg/dL (ref 0.2–1.2)
Total Protein: 6.8 g/dL (ref 6.1–8.1)

## 2021-02-24 LAB — HEMOGLOBIN A1C
Hgb A1c MFr Bld: 11.4 % of total Hgb — ABNORMAL HIGH (ref <5.7)
Mean Plasma Glucose: 280 mg/dL
eAG (mmol/L): 15.5 mmol/L

## 2021-03-27 ENCOUNTER — Other Ambulatory Visit: Payer: Self-pay | Admitting: Adult Health

## 2021-04-10 ENCOUNTER — Encounter: Payer: BC Managed Care – PPO | Admitting: Adult Health

## 2021-04-21 ENCOUNTER — Other Ambulatory Visit: Payer: Self-pay | Admitting: Internal Medicine

## 2021-04-24 ENCOUNTER — Other Ambulatory Visit: Payer: Self-pay

## 2021-04-24 MED ORDER — "BD VEO INSULIN SYRINGE U/F 31G X 15/64"" 0.5 ML MISC": 3 refills | Status: DC

## 2021-05-15 DIAGNOSIS — Z86010 Personal history of colonic polyps: Secondary | ICD-10-CM | POA: Insufficient documentation

## 2021-05-15 NOTE — Progress Notes (Signed)
Complete Physical  Assessment and Plan:   Encounter for general adult medical examination with abnormal findings 1 YEAR  Atherosclerosis of aortic bifurcation and common iliac arteries ([place]) Per MRI 2021 Control blood pressure, cholesterol, glucose, increase exercise.   Essential hypertension - continue medications, DASH diet, exercise and monitor at home. Call if greater than 130/80.  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  Discussed general issues about diabetes pathophysiology and management., Educational material distributed., Suggested low cholesterol diet., Encouraged aerobic exercise., Discussed foot care., Reminded to get yearly retinal exam. -     Hemoglobin A1c  Hyperlipidemia associated with type 2 diabetes mellitus (HCC) -     Lipid panel check lipids decrease fatty foods increase activity.   Uncontrolled type 2 diabetes mellitus with stage 2 chronic kidney disease, with long-term current use of insulin ([place]) Discussed general issues about diabetes pathophysiology and management., Educational material distributed., Suggested low cholesterol diet., Encouraged aerobic exercise., Discussed foot care., Reminded to get yearly retinal exam. - Ozempic starting dose with taper given; 6 weeks samples - stop glipizide, goal to taper down insulin and improve weight -     Hemoglobin A1c  CKD stage 2 due to type 2 diabetes mellitus (HCC) -     Hemoglobin A1c Increase fluids, avoid NSAIDS, monitor sugars, will monitor  Morbid obesity (HCC) - follow up 3 months for progress monitoring - increase veggies, decrease carbs - long  discussion about weight loss, diet, and exercise - ozempic started - plan to reduce insulin  Poor compliance with diet Motivated, improved with continuous glucose monitoring Discuss at each visit  Medication management -     Magnesium  [name] -     VITAMIN D 25 Hydroxy (Vit-D Deficiency, Fractures)   Testosterone deficiency -     Testosterone -     testosterone cypionate (DEPOTESTOSTERONE CYPIONATE) injection 200 mg  Nonallergic rhinitis MONITOR  Obstructive sleep apnea-questioned Sleep apnea- continue CPAP, weight loss advised CPAP recalled, refer back to sleep specialist for new machine/may need repeat study   Screening prostate cancer        -     PSA  Left cervical lymph nodes tender ? R/t insect bite; check CBC, monitor; follow up if not resolving  Recurrent kidney stones/uric acid On flomax with benefit; [facility] following Check Uric acid levels  History of adenomatous polyp of colon Due for follow up; referral placed for GI     Orders Placed This Encounter  Procedures  . CBC with Differential/Platelet  . COMPLETE METABOLIC PANEL WITH GFR  . Magnesium  . Lipid panel  . TSH  . Hemoglobin A1c  . VITAMIN D 25 Hydroxy (Vit-D Deficiency, Fractures)  . Testosterone  . PSA  . Microalbumin / creatinine urine ratio  . Urinalysis, Routine w reflex microscopic  . Uric acid  . Ambulatory referral to Sleep Studies  . Ambulatory referral to Gastroenterology  . EKG 12-Lead  . HM DIABETES FOOT EXAM    Discussed med's effects and SE's. Screening labs and tests as requested with regular follow-up as recommended. Over  40 minutes of exam, counseling, chart review and critical decision making was performed  Future Appointments  Date Time Provider El[place]  05/31/2021  9:00 AM [facility] NURSE [facility] None  08/23/2021 11:30 AM Liane Comber, NP [facility] None  05/17/2022 10:00 AM Liane Comber, NP [facility] None     HPI 60  y.o. male patient presents for a complete physical. 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   He is married, 3 children, 4 grandchildren.  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   Reports was at ball game last week, believes was bitten by insect on scalp, R lymph notes have been tender since that time.   Father in [place] had left nephrectomy due to renal cancer, he was there to care give for him and has gotten out of his routine. Dad is doing better, he is 82.    Severe OSA per sleep study in 2014, he is on CPAP, reports was recalled  BMI is Body mass index is 37.12 kg/m., he has been working on diet and exercise. Wt Readings from Last 3 Encounters:  05/17/21 255 lb (115.7 kg)  02/23/21 257 lb (116.6 kg)  11/08/20 258 lb (117 kg)   He follows with Dr. Clover Mealy for perirenal hematoma, had MRI AB 02/2020 for monitoring that showed atherosclerosis of the aorta/illiac disease.   His blood pressure has been controlled at home, today their BP is BP: 118/78 He does not workout but tried to get 5000 steps a day.  He denies chest pain, shortness of breath, dizziness.   He has been working on diet and exercise for Diabetes  with diabetic chronic kidney disease he is on ACE/ARB Hyperlipidemia -  on zetia and lipitor  he is on bASA every other day due  to history of retroperitoneal hematoma   he is on 70/30 insulin he is taking variable, 17-25 units in am depending on glucose reading and 8-18 units with evening meal.  metformin 500mg , two tablets twice a day Also taking glipizide 5 mg BID with meals  he has continuous glucose monitor which is helping him to better control his glucose.   He has tried to improve eating at lunch time.   Denies any low sugars, states low is 140-150 and high is 170 and fasting glucose ranges around 160-170  denies paresthesia of the feet, polydipsia, polyuria and visual disturbances.  Last A1C was:  Lab Results  Component Value Date   HGBA1C 11.4 (H) 02/23/2021   Follows with Dr. Moshe Cipro due to hx of periphephretic hematoma following stone. Last GFR: Lab Results  Component Value Date   GFRAA 75 02/23/2021   Lab Results  Component Value Date   CHOL 126 02/23/2021   HDL 31 (L) 02/23/2021   LDLCALC 74 02/23/2021   TRIG 119 02/23/2021   CHOLHDL 4.1 02/23/2021   Patient is on Vitamin D supplement.   Lab Results  Component Value Date   VD25OH 78 07/29/2020     Denies LUTs.  Does take flomax daily due to frequent kidney stones, follows with [facility] Marjie Skiff, ? PA. Last PSA was: Lab Results  Component Value Date   PSA 1.4 04/08/2020   He has a history of testosterone deficiency and is on testosterone replacement. He is prescribed 400 mg q2w, but hasn't been keeping up with this, last injection was 02/23/2021. He states that the testosterone helps with his energy. Lab Results  Component Value Date   TESTOSTERONE 445 04/08/2020      Current Medications:    Current Outpatient Medications (Endocrine & Metabolic):  .  metFORMIN (GLUCOPHAGE-XR) 500 MG 24 hr tablet, Take     2 tablets     2 x /day       with Meals       for Diabetes (Dx - e11.9) .  Semaglutide,0.25 or 0.5MG /DOS, (OZEMPIC, 0.25 OR 0.5 MG/DOSE,) 2 MG/1.5ML SOPN, Start by injecting 0.25 mg into skin of stomach once weekly;  if doing well increase to 0.5 mg in 4 weeks. Marland Kitchen  testosterone cypionate (DEPOTESTOSTERONE CYPIONATE) 200 MG/ML injection, Inject 2 ml into Muscle every 2 weeks .  insulin NPH-regular Human (NOVOLIN 70/30) (70-30) 100 UNIT/ML injection, Inject up to 30 Units 2 x /day with Meals for Diabetes  Current Facility-Administered Medications (Endocrine & Metabolic):  .  testosterone cypionate (DEPOTESTOSTERONE CYPIONATE) injection 200 mg .  testosterone cypionate (DEPOTESTOSTERONE CYPIONATE) injection 200 mg .  testosterone cypionate (DEPOTESTOSTERONE CYPIONATE) injection 200 mg .  testosterone cypionate (DEPOTESTOSTERONE CYPIONATE) injection 200 mg  Current Outpatient Medications (Cardiovascular):  .  amLODipine (NORVASC) 10 MG tablet, TAKE 1 TABLET DAILY FOR BLOOD PRESSURE .  atorvastatin (LIPITOR) 20 MG tablet, TAKE 1 TABLET BY MOUTH EVERY DAY FOR CHOLESTEROL .  bisoprolol (ZEBETA) 10 MG tablet, TAKE 1 TABLET DAILY FOR BLOOD PRESSURE (DX - I10) .  ezetimibe (ZETIA) 10 MG tablet, TAKE 1 TABLET BY DAILY FOR CHOLESTEROL .  indapamide (LOZOL) 1.25 MG tablet, Take 1.25 mg by mouth daily. .  tadalafil (CIALIS) 20 MG tablet, Take 1/2 to 1 tablet every 2-3 days as needed (Patient taking differently: Take 10-20 mg by mouth daily as needed for erectile dysfunction. Take 1/2 to 1 tablet every 2-3 days as needed) .  telmisartan (MICARDIS) 40 MG tablet, TAKE 1 TABLET DAILY FOR BLOOD PRESSURE & DIABETIC KIDNEY PROTECTION   Current Outpatient Medications (Respiratory):  .  ciclesonide (OMNARIS) 50 MCG/ACT nasal spray, Place 2 sprays into both nostrils daily as needed for allergies.        Current Outpatient Medications (Other):  .  clotrimazole-betamethasone (LOTRISONE) cream, APPLY 3 TIMES A DAY AS NEEDED .  Continuous Blood Gluc Sensor (FREESTYLE LIBRE 14 DAY SENSOR) MISC, Apply 1 sensor once every 2 weeks DX Z79.4 insulin dependent diabetes .  Continuous Blood Gluc Sensor MISC, 1 each by Does not apply  route as directed. Use as directed every 10 days. May dispense FreeStyle [employer] or similar. .  fluconazole (DIFLUCAN) 150 MG tablet, Take one tablet as needed for yeast. .  glucose blood (FREESTYLE LITE) test strip, Check blood sugar 3 times a day or as directed. DX-E11.22 .  Insulin Syringe-Needle U-100 (BD VEO INSULIN SYRINGE U/F) 31G X 15/64" 0.5 ML MISC, USE 2 X /DAY FOR INSULIN/DISPENSE INSURANCE PREFERENCE DX: E11.22 .  ketoconazole (NIZORAL) 2 % shampoo, APPLY 1 APPLICATION TOPICALLY 2 (TWO) TIMES A WEEK. APPLY & SHAMPOO 2 X /WEEK .  senna-docusate (SENOKOT-S) 8.6-50 MG tablet, Take by mouth. .  tamsulosin (FLOMAX) 0.4  MG CAPS capsule, Take 0.4 mg by mouth daily. .  Vitamin D, Ergocalciferol, (DRISDOL) 1.25 MG (50000 UNIT) CAPS capsule, Take 1 capsule 2 x /week on Mon - Thurs   Allergies:  Allergies  Allergen Reactions  . Ppd [Tuberculin Purified Protein Derivative] Other (See Comments)    POSITIVE REACTION IN PAST 11/21/11 CXR NEG   Health Maintenance:  Immunization History  Administered Date(s) Administered  . Influenza Split 10/10/2013  . Influenza Whole 09/08/2012  . Influenza-Unspecified 09/22/2018, 09/11/2019, 09/18/2019  . Moderna Sars-Covid-2 Vaccination 02/01/2020, 03/01/2020  . Pneumococcal-Unspecified 12/31/2011  . Tdap 12/19/2009, 04/08/2020    Tetanus: 2021 Pneumovax: 2003 Prevnar 13: -  Flu vaccine: 2020 Shingrix:  Covid 19: 2/2, moderna - had booster, info requested  Colonoscopy: 04/28/2018, Dr. Havery Moros, adenomatous polyps, 3 year recall. - referred back  EGD:  Eye Exam: Dr. 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   Surgical History:  He  has a past surgical history that includes Wisdom tooth extraction; Hernia repair; Colonoscopy; and Rotator cuff repair (Left, 2017 sept 5).   Family History:  His family history includes Cancer (age of onset: 65) in his father; Diabetes in his brother, father, and mother; Diabetes Mellitus II in his paternal grandfather; Hyperlipidemia in his father and mother; Hypertension in his brother, father, and mother; Myelodysplastic syndrome in his mother.   Social History:   reports that he quit smoking about 20 years ago. His smoking use included cigarettes. He has a 2.40 pack-year smoking history. He has never used smokeless tobacco. He reports current alcohol use. He reports that he does not use drugs.   Review of Systems:  Review of Systems  Constitutional: Negative.  Negative for malaise/fatigue and weight loss.  HENT: Negative.  Negative for hearing loss and tinnitus.   Eyes: Negative.  Negative for blurred vision and double vision.  Respiratory: Negative.  Negative for cough, shortness of breath and wheezing.   Cardiovascular: Negative.  Negative for chest pain, palpitations, orthopnea, claudication and leg swelling.  Gastrointestinal: Negative.  Negative for abdominal pain, blood in stool, constipation, diarrhea, heartburn, melena, nausea and vomiting.  Genitourinary: Negative.   Musculoskeletal: Negative.  Negative for joint pain and myalgias.  Skin: Negative.  Negative for rash.  Neurological: Negative.  Negative for dizziness, tingling, sensory change,  weakness and headaches.  Endo/Heme/Allergies: Negative.  Negative for polydipsia.  Psychiatric/Behavioral: Negative.   All other systems reviewed and are negative.   Physical Exam: Estimated body mass index is 37.12 kg/m as calculated from the following:   Height as of this encounter: 5' 9.5" (1.765 m).   Weight as of this encounter: 255 lb (115.7 kg). BP 118/78   Pulse 78   Temp (!) 97.1 F (36.2 C)   Ht 5' 9.5" (1.765 m)   Wt 255 lb (115.7 kg)   SpO2 96%   BMI 37.12 kg/m  General Appearance: Well nourished, well dressed obese  AA adult male in no apparent distress.  Eyes: PERRLA, EOMs, conjunctiva no swelling or erythema Sinuses: No Frontal/maxillary tenderness  ENT/Mouth: Ext aud canals clear, normal light reflex with TMs without erythema, bulging. Good dentition. No erythema, swelling, or exudate on post pharynx. Tonsils not swollen or erythematous. Hearing normal.  Neck: Supple, thyroid normal. No bruits  Respiratory: Respiratory effort normal, BS equal bilaterally without rales, rhonchi, wheezing or stridor.  Cardio: RRR without murmurs, prominent S2, no rubs or gallops. Brisk peripheral pulses without edema.  Chest: symmetric, with normal excursions and percussion.  Abdomen: Soft,obese abdomen, nontender, no guarding, rebound, hernias, masses, or organomegaly.  Lymphatics: Tender right upper anterior cervical chain with single palpable lymph node; otherwise without lymphadenopathy.  Genitourinary: declines, doing self exams, no concerns, urology follows Musculoskeletal: Full ROM all peripheral extremities,5/5 strength, and normal gait.  Skin: Warm, dry without rashes, lesions, ecchymosis. Neuro: Cranial nerves intact, reflexes equal bilaterally. Normal muscle tone, no cerebellar symptoms. Sensation intact to monofilament bil feet.  Psych: Awake and oriented X 3, normal affect, Insight and Judgment appropriate.   EKG: NSR, NSCPT  Russell [name] 12:42 PM [facility]

## 2021-05-17 ENCOUNTER — Encounter: Payer: Self-pay | Admitting: Adult Health

## 2021-05-17 ENCOUNTER — Other Ambulatory Visit: Payer: Self-pay

## 2021-05-17 ENCOUNTER — Ambulatory Visit (INDEPENDENT_AMBULATORY_CARE_PROVIDER_SITE_OTHER): Payer: BC Managed Care – PPO | Admitting: Adult Health

## 2021-05-17 VITALS — BP 118/78 | HR 78 | Temp 97.1°F | Ht 69.5 in | Wt 255.0 lb

## 2021-05-17 DIAGNOSIS — N182 Chronic kidney disease, stage 2 (mild): Secondary | ICD-10-CM

## 2021-05-17 DIAGNOSIS — N210 Calculus in bladder: Secondary | ICD-10-CM

## 2021-05-17 DIAGNOSIS — IMO0002 Reserved for concepts with insufficient information to code with codable children: Secondary | ICD-10-CM

## 2021-05-17 DIAGNOSIS — N200 Calculus of kidney: Secondary | ICD-10-CM | POA: Insufficient documentation

## 2021-05-17 DIAGNOSIS — Z79899 Other long term (current) drug therapy: Secondary | ICD-10-CM | POA: Diagnosis not present

## 2021-05-17 DIAGNOSIS — N521 Erectile dysfunction due to diseases classified elsewhere: Secondary | ICD-10-CM

## 2021-05-17 DIAGNOSIS — Z794 Long term (current) use of insulin: Secondary | ICD-10-CM | POA: Insufficient documentation

## 2021-05-17 DIAGNOSIS — I10 Essential (primary) hypertension: Secondary | ICD-10-CM

## 2021-05-17 DIAGNOSIS — R350 Frequency of micturition: Secondary | ICD-10-CM

## 2021-05-17 DIAGNOSIS — E1122 Type 2 diabetes mellitus with diabetic chronic kidney disease: Secondary | ICD-10-CM

## 2021-05-17 DIAGNOSIS — I708 Atherosclerosis of other arteries: Secondary | ICD-10-CM

## 2021-05-17 DIAGNOSIS — Z13220 Encounter for screening for lipoid disorders: Secondary | ICD-10-CM | POA: Diagnosis not present

## 2021-05-17 DIAGNOSIS — Z125 Encounter for screening for malignant neoplasm of prostate: Secondary | ICD-10-CM

## 2021-05-17 DIAGNOSIS — E785 Hyperlipidemia, unspecified: Secondary | ICD-10-CM

## 2021-05-17 DIAGNOSIS — Z136 Encounter for screening for cardiovascular disorders: Secondary | ICD-10-CM

## 2021-05-17 DIAGNOSIS — I700 Atherosclerosis of aorta: Secondary | ICD-10-CM | POA: Diagnosis not present

## 2021-05-17 DIAGNOSIS — Z1389 Encounter for screening for other disorder: Secondary | ICD-10-CM

## 2021-05-17 DIAGNOSIS — E1129 Type 2 diabetes mellitus with other diabetic kidney complication: Secondary | ICD-10-CM

## 2021-05-17 DIAGNOSIS — N401 Enlarged prostate with lower urinary tract symptoms: Secondary | ICD-10-CM | POA: Diagnosis not present

## 2021-05-17 DIAGNOSIS — Z1329 Encounter for screening for other suspected endocrine disorder: Secondary | ICD-10-CM

## 2021-05-17 DIAGNOSIS — E349 Endocrine disorder, unspecified: Secondary | ICD-10-CM

## 2021-05-17 DIAGNOSIS — Z86010 Personal history of colonic polyps: Secondary | ICD-10-CM

## 2021-05-17 DIAGNOSIS — Z860101 Personal history of adenomatous and serrated colon polyps: Secondary | ICD-10-CM

## 2021-05-17 DIAGNOSIS — E559 Vitamin D deficiency, unspecified: Secondary | ICD-10-CM

## 2021-05-17 DIAGNOSIS — Z131 Encounter for screening for diabetes mellitus: Secondary | ICD-10-CM

## 2021-05-17 DIAGNOSIS — E1169 Type 2 diabetes mellitus with other specified complication: Secondary | ICD-10-CM

## 2021-05-17 DIAGNOSIS — Z0000 Encounter for general adult medical examination without abnormal findings: Secondary | ICD-10-CM

## 2021-05-17 DIAGNOSIS — G4733 Obstructive sleep apnea (adult) (pediatric): Secondary | ICD-10-CM

## 2021-05-17 DIAGNOSIS — E1159 Type 2 diabetes mellitus with other circulatory complications: Secondary | ICD-10-CM

## 2021-05-17 MED ORDER — NOVOLIN 70/30 (70-30) 100 UNIT/ML ~~LOC~~ SUSP: SUBCUTANEOUS | 1 refills | Status: DC

## 2021-05-17 MED ORDER — OZEMPIC (0.25 OR 0.5 MG/DOSE) 2 MG/1.5ML ~~LOC~~ SOPN: PEN_INJECTOR | SUBCUTANEOUS | 0 refills | Status: DC

## 2021-05-17 MED ORDER — TESTOSTERONE CYPIONATE 200 MG/ML IM SOLN
200.0000 mg | Freq: Once | INTRAMUSCULAR | Status: AC
Start: 2021-05-17 — End: 2021-05-17
  Administered 2021-05-17: 200 mg via INTRAMUSCULAR

## 2021-05-17 NOTE — Progress Notes (Signed)
Patient here for a Testosterone injection 200 mg/ml 2 ml IM injection inLEFTupper outer quadrant. Patient tolerated well and will return in 2 weeks for next injection.

## 2021-05-17 NOTE — Patient Instructions (Signed)
Russell [name] , Thank you for taking time to come for your Annual Wellness Visit. I appreciate your ongoing commitment to your health goals. Please review the following plan we discussed and let me know if I can assist you in the future.   These are the goals we discussed: Goals    . HEMOGLOBIN A1C < 8.0    . LDL CALC < 70    . Weight (lb) < 230 lb (104.3 kg)       This is a list of the screening recommended for you and due dates:  Health Maintenance  Topic Date Due  . Pneumococcal vaccine  Never done  . Pneumococcal Vaccination (1 of 4 - PCV13) Never done  . Zoster (Shingles) Vaccine (1 of 2) Never done  . Eye exam for diabetics  06/12/2019  . COVID-19 Vaccine (3 - Booster for Moderna series) 08/01/2020  . Colon Cancer Screening  04/28/2021  . Flu Shot  07/10/2021  . Hemoglobin A1C  08/26/2021  . Complete foot exam   05/17/2022  . Tetanus Vaccine  04/08/2030  . Hepatitis C Screening: USPSTF Recommendation to screen - Ages 70-79 yo.  Completed  . HIV Screening  Completed  . HPV Vaccine  Aged Out     Starting ozempic - 0.25 mg/week x 4 weeks, then 0.5 mg/week if tolerating. Contact for refill after the first 0.5 mg dose if tolerating well. Store in [place].   STOP glipizide - monitor glucose and taper down as glucose comes down  Semaglutide injection solution What is this medicine? SEMAGLUTIDE (Sem a GLOO tide) is used to improve blood sugar control in adults with type 2 diabetes. This medicine may be used with other diabetes medicines. This drug may also reduce the risk of heart attack or stroke if you have type 2 diabetes and risk factors for heart disease. This medicine may be used for other purposes; ask your health care provider or pharmacist if you have questions. COMMON BRAND NAME(S): OZEMPIC What should I tell my health care provider before I take this medicine? They need to know if you have any of these conditions:  endocrine tumors (MEN 2) or if someone in your  family had these tumors  eye disease, vision problems  history of pancreatitis  kidney disease  stomach problems  thyroid cancer or if someone in your family had thyroid cancer  an unusual or allergic reaction to semaglutide, other medicines, foods, dyes, or preservatives  pregnant or trying to get pregnant  breast-feeding How should I use this medicine? This medicine is for injection under the skin of your upper leg (thigh), stomach area, or upper arm. It is given once every week (every 7 days). You will be taught how to prepare and give this medicine. Use exactly as directed. Take your medicine at regular intervals. Do not take it more often than directed. If you use this medicine with insulin, you should inject this medicine and the insulin separately. Do not mix them together. Do not give the injections right next to each other. Change (rotate) injection sites with each injection. It is important that you put your used needles and syringes in a special sharps container. Do not put them in a trash can. If you do not have a sharps container, call your pharmacist or healthcare provider to get one. A special MedGuide will be given to you by the pharmacist with each prescription and refill. Be sure to read this information carefully each time. This drug comes  with INSTRUCTIONS FOR USE. Ask your pharmacist for directions on how to use this drug. Read the information carefully. Talk to your pharmacist or health care provider if you have questions. Talk to your pediatrician regarding the use of this medicine in children. Special care may be needed. Overdosage: If you think you have taken too much of this medicine contact a poison control center or emergency room at once. NOTE: This medicine is only for you. Do not share this medicine with others. What if I miss a dose? If you miss a dose, take it as soon as you can within 5 days after the missed dose. Then take your next dose at your regular  weekly time. If it has been longer than 5 days after the missed dose, do not take the missed dose. Take the next dose at your regular time. Do not take double or extra doses. If you have questions about a missed dose, contact your health care provider for advice. What may interact with this medicine?  other medicines for diabetes Many medications may cause changes in blood sugar, these include:  alcohol containing beverages  antiviral medicines for HIV or AIDS  aspirin and aspirin-like drugs  certain medicines for blood pressure, heart disease, irregular heart beat  chromium  diuretics  male hormones, such as estrogens or progestins, birth control pills  fenofibrate  gemfibrozil  isoniazid  lanreotide  male hormones or anabolic steroids  MAOIs like Carbex, Eldepryl, Marplan, Nardil, and Parnate  medicines for weight loss  medicines for allergies, asthma, cold, or cough  medicines for depression, anxiety, or psychotic disturbances  niacin  nicotine  NSAIDs, medicines for pain and inflammation, like ibuprofen or naproxen  octreotide  pasireotide  pentamidine  phenytoin  probenecid  quinolone antibiotics such as ciprofloxacin, levofloxacin, ofloxacin  some herbal dietary supplements  steroid medicines such as prednisone or cortisone  sulfamethoxazole; trimethoprim  thyroid hormones Some medications can hide the warning symptoms of low blood sugar (hypoglycemia). You may need to monitor your blood sugar more closely if you are taking one of these medications. These include:  beta-blockers, often used for high blood pressure or heart problems (examples include atenolol, metoprolol, propranolol)  clonidine  guanethidine  reserpine This list may not describe all possible interactions. Give your health care provider a list of all the medicines, herbs, non-prescription drugs, or dietary supplements you use. Also tell them if you smoke, drink alcohol,  or use illegal drugs. Some items may interact with your medicine. What should I watch for while using this medicine? Visit your doctor or health care professional for regular checks on your progress. Drink plenty of fluids while taking this medicine. Check with your doctor or health care professional if you get an attack of severe diarrhea, nausea, and vomiting. The loss of too much body fluid can make it dangerous for you to take this medicine. A test called the HbA1C (A1C) will be monitored. This is a simple blood test. It measures your blood sugar control over the last 2 to 3 months. You will receive this test every 3 to 6 months. Learn how to check your blood sugar. Learn the symptoms of low and high blood sugar and how to manage them. Always carry a quick-source of sugar with you in case you have symptoms of low blood sugar. Examples include hard sugar candy or glucose tablets. Make sure others know that you can choke if you eat or drink when you develop serious symptoms of low blood sugar,  such as seizures or unconsciousness. They must get medical help at once. Tell your doctor or health care professional if you have high blood sugar. You might need to change the dose of your medicine. If you are sick or exercising more than usual, you might need to change the dose of your medicine. Do not skip meals. Ask your doctor or health care professional if you should avoid alcohol. Many nonprescription cough and cold products contain sugar or alcohol. These can affect blood sugar. Pens should never be shared. Even if the needle is changed, sharing may result in passing of viruses like hepatitis or HIV. Wear a medical ID bracelet or chain, and carry a card that describes your disease and details of your medicine and dosage times. Do not become pregnant while taking this medicine. Women should inform their doctor if they wish to become pregnant or think they might be pregnant. There is a potential for serious  side effects to an unborn child. Talk to your health care professional or pharmacist for more information. What side effects may I notice from receiving this medicine? Side effects that you should report to your doctor or health care professional as soon as possible:  allergic reactions like skin rash, itching or hives, swelling of the face, lips, or tongue  breathing problems  changes in vision  diarrhea that continues or is severe  lump or swelling on the neck  severe nausea  signs and symptoms of infection like fever or chills; cough; sore throat; pain or trouble passing urine  signs and symptoms of low blood sugar such as feeling anxious, confusion, dizziness, increased hunger, unusually weak or tired, sweating, shakiness, cold, irritable, headache, blurred vision, fast heartbeat, loss of consciousness  signs and symptoms of kidney injury like trouble passing urine or change in the amount of urine  trouble swallowing  unusual stomach upset or pain  vomiting Side effects that usually do not require medical attention (report to your doctor or health care professional if they continue or are bothersome):  constipation  diarrhea  nausea  pain, redness, or irritation at site where injected  stomach upset This list may not describe all possible side effects. Call your doctor for medical advice about side effects. You may report side effects to FDA at 1-800-FDA-1088. Where should I keep my medicine? Keep out of the reach of children. Store unopened pens in a refrigerator between 2 and 8 degrees C (36 and 46 degrees F). Do not freeze. Protect from light and heat. After you first use the pen, it can be stored for 56 days at room temperature between 15 and 30 degrees C (59 and 86 degrees F) or in a refrigerator. Throw away your used pen after 56 days or after the expiration date, whichever comes first. Do not store your pen with the needle attached. If the needle is left on,  medicine may leak from the pen. NOTE: This sheet is a summary. It may not cover all possible information. If you have questions about this medicine, talk to your doctor, pharmacist, or health care provider.  2021 Elsevier/Gold Standard (2019-08-11 09:41:51)

## 2021-05-18 ENCOUNTER — Encounter: Payer: Self-pay | Admitting: Adult Health

## 2021-05-18 DIAGNOSIS — E790 Hyperuricemia without signs of inflammatory arthritis and tophaceous disease: Secondary | ICD-10-CM | POA: Insufficient documentation

## 2021-05-18 LAB — COMPLETE METABOLIC PANEL WITH GFR
AG Ratio: 1.4 (calc) (ref 1.0–2.5)
ALT: 16 U/L (ref 9–46)
AST: 12 U/L (ref 10–35)
Albumin: 4.3 g/dL (ref 3.6–5.1)
Alkaline phosphatase (APISO): 65 U/L (ref 35–144)
BUN/Creatinine Ratio: 23 (calc) — ABNORMAL HIGH (ref 6–22)
BUN: 27 mg/dL — ABNORMAL HIGH (ref 7–25)
CO2: 29 mmol/L (ref 20–32)
Calcium: 10.2 mg/dL (ref 8.6–10.3)
Chloride: 101 mmol/L (ref 98–110)
Creat: 1.2 mg/dL (ref 0.70–1.33)
GFR, Est African American: 77 mL/min/{1.73_m2} (ref >=60)
GFR, Est Non African American: 66 mL/min/{1.73_m2} (ref >=60)
Globulin: 3 g/dL (calc) (ref 1.9–3.7)
Glucose, Bld: 117 mg/dL — ABNORMAL HIGH (ref 65–99)
Potassium: 4.2 mmol/L (ref 3.5–5.3)
Sodium: 138 mmol/L (ref 135–146)
Total Bilirubin: 0.5 mg/dL (ref 0.2–1.2)
Total Protein: 7.3 g/dL (ref 6.1–8.1)

## 2021-05-18 LAB — LIPID PANEL
Cholesterol: 121 mg/dL (ref <200)
HDL: 30 mg/dL — ABNORMAL LOW (ref >=40)
LDL Cholesterol (Calc): 70 mg/dL (calc)
Non-HDL Cholesterol (Calc): 91 mg/dL (calc) (ref <130)
Total CHOL/HDL Ratio: 4 (calc) (ref <5.0)
Triglycerides: 127 mg/dL (ref <150)

## 2021-05-18 LAB — URINALYSIS, ROUTINE W REFLEX MICROSCOPIC
Bilirubin Urine: NEGATIVE
Glucose, UA: NEGATIVE
Hgb urine dipstick: NEGATIVE
Ketones, ur: NEGATIVE
Leukocytes,Ua: NEGATIVE
Nitrite: NEGATIVE
Protein, ur: NEGATIVE
Specific Gravity, Urine: 1.022 (ref 1.001–1.035)
pH: 5.5 (ref 5.0–8.0)

## 2021-05-18 LAB — HEMOGLOBIN A1C
Hgb A1c MFr Bld: 8.5 % of total Hgb — ABNORMAL HIGH (ref <5.7)
Mean Plasma Glucose: 197 mg/dL
eAG (mmol/L): 10.9 mmol/L

## 2021-05-18 LAB — CBC WITH DIFFERENTIAL/PLATELET
Absolute Monocytes: 663 cells/uL (ref 200–950)
Basophils Absolute: 31 cells/uL (ref 0–200)
Basophils Relative: 0.6 %
Eosinophils Absolute: 270 cells/uL (ref 15–500)
Eosinophils Relative: 5.3 %
HCT: 44.1 % (ref 38.5–50.0)
Hemoglobin: 14.1 g/dL (ref 13.2–17.1)
Lymphs Abs: 1423 cells/uL (ref 850–3900)
MCH: 26.7 pg — ABNORMAL LOW (ref 27.0–33.0)
MCHC: 32 g/dL (ref 32.0–36.0)
MCV: 83.4 fL (ref 80.0–100.0)
MPV: 9.6 fL (ref 7.5–12.5)
Monocytes Relative: 13 %
Neutro Abs: 2713 cells/uL (ref 1500–7800)
Neutrophils Relative %: 53.2 %
Platelets: 324 10*3/uL (ref 140–400)
RBC: 5.29 10*6/uL (ref 4.20–5.80)
RDW: 14.4 % (ref 11.0–15.0)
Total Lymphocyte: 27.9 %
WBC: 5.1 10*3/uL (ref 3.8–10.8)

## 2021-05-18 LAB — TESTOSTERONE: Testosterone: 205 ng/dL — ABNORMAL LOW (ref 250–827)

## 2021-05-18 LAB — VITAMIN D 25 HYDROXY (VIT D DEFICIENCY, FRACTURES): Vit D, 25-Hydroxy: 66 ng/mL (ref 30–100)

## 2021-05-18 LAB — URIC ACID: Uric Acid, Serum: 8.1 mg/dL — ABNORMAL HIGH (ref 4.0–8.0)

## 2021-05-18 LAB — MICROALBUMIN / CREATININE URINE RATIO
Creatinine, Urine: 133 mg/dL (ref 20–320)
Microalb Creat Ratio: 5 mcg/mg creat (ref <30)
Microalb, Ur: 0.6 mg/dL

## 2021-05-18 LAB — PSA: PSA: 1.9 ng/mL (ref <=4.00)

## 2021-05-18 LAB — MAGNESIUM: Magnesium: 1.9 mg/dL (ref 1.5–2.5)

## 2021-05-18 LAB — TSH: TSH: 0.95 mIU/L (ref 0.40–4.50)

## 2021-05-23 ENCOUNTER — Other Ambulatory Visit: Payer: Self-pay

## 2021-05-23 ENCOUNTER — Ambulatory Visit: Payer: BC Managed Care – PPO | Admitting: Internal Medicine

## 2021-05-23 ENCOUNTER — Encounter: Payer: Self-pay | Admitting: Internal Medicine

## 2021-05-23 VITALS — BP 112/78 | HR 79 | Temp 97.9°F | Resp 17 | Ht 70.0 in | Wt 260.2 lb

## 2021-05-23 DIAGNOSIS — L03811 Cellulitis of head [any part, except face]: Secondary | ICD-10-CM | POA: Diagnosis not present

## 2021-05-23 MED ORDER — DEXAMETHASONE 2 MG PO TABS: ORAL_TABLET | ORAL | 1 refills | Status: DC

## 2021-05-23 MED ORDER — DOXYCYCLINE HYCLATE 100 MG PO CAPS: ORAL_CAPSULE | ORAL | 1 refills | Status: DC

## 2021-05-23 NOTE — Progress Notes (Signed)
Future Appointments  Date Time Provider [place]  05/31/2021  9:00 AM [facility] NURSE [facility] None  08/23/2021 11:30 AM Liane Comber, NP [facility] None  05/17/2022 10:00 AM Liane Comber, NP [facility] None    History of Present Illness:    Patient presents with a rash on the right side of his scalp for 2 weeks.   Medications  Current Outpatient Medications (Endocrine & Metabolic):    insulin NPH-regular Human (NOVOLIN 70/30) (70-30) 100 UNIT/ML injection, Inject up to 30 Units 2 x /day with Meals for Diabetes   metFORMIN (GLUCOPHAGE-XR) 500 MG 24 hr tablet, Take     2 tablets     2 x /day       with Meals       for Diabetes (Dx - e11.9)   Semaglutide,0.25 or 0.5MG /DOS, (OZEMPIC, 0.25 OR 0.5 MG/DOSE,) 2 MG/1.5ML SOPN, Start by injecting 0.25 mg into skin of stomach once weekly; if doing well increase to 0.5 mg in 4 weeks.   testosterone cypionate (DEPOTESTOSTERONE CYPIONATE) 200 MG/ML injection, Inject 2 ml into Muscle every 2 weeks  Current Facility-Administered Medications (Endocrine & Metabolic):    testosterone cypionate (DEPOTESTOSTERONE CYPIONATE) injection 200 mg   testosterone cypionate (DEPOTESTOSTERONE CYPIONATE) injection 200 mg   testosterone cypionate (DEPOTESTOSTERONE CYPIONATE) injection 200 mg   testosterone cypionate (DEPOTESTOSTERONE CYPIONATE) injection 200 mg  Current Outpatient Medications (Cardiovascular):    amLODipine (NORVASC) 10 MG tablet, TAKE 1 TABLET DAILY FOR BLOOD PRESSURE   atorvastatin (LIPITOR) 20 MG tablet, TAKE 1 TABLET BY MOUTH EVERY DAY FOR CHOLESTEROL   bisoprolol (ZEBETA) 10 MG tablet, TAKE 1 TABLET DAILY FOR BLOOD PRESSURE (DX - I10)   ezetimibe (ZETIA) 10 MG tablet, TAKE 1 TABLET BY DAILY FOR CHOLESTEROL   indapamide (LOZOL) 1.25 MG tablet, Take 1.25 mg by mouth daily.   tadalafil (CIALIS) 20 MG tablet, Take 1/2 to 1 tablet every 2-3 days as needed (Patient taking differently: Take 10-20 mg by mouth daily as needed for  erectile dysfunction. Take 1/2 to 1 tablet every 2-3 days as needed)   telmisartan (MICARDIS) 40 MG tablet, TAKE 1 TABLET DAILY FOR BLOOD PRESSURE & DIABETIC KIDNEY PROTECTION   Current Outpatient Medications (Respiratory):    ciclesonide (OMNARIS) 50 MCG/ACT nasal spray, Place 2 sprays into both nostrils daily as needed for allergies.        Current Outpatient Medications (Other):    clotrimazole-betamethasone (LOTRISONE) cream, APPLY 3 TIMES A DAY AS NEEDED   Continuous Blood Gluc Sensor (FREESTYLE LIBRE 14 DAY SENSOR) MISC, Apply 1 sensor once every 2 weeks DX Z79.4 insulin dependent diabetes   Continuous Blood Gluc Sensor MISC, 1 each by Does not apply route as directed. Use as directed every 10 days. May dispense FreeStyle [employer] or similar.   fluconazole (DIFLUCAN) 150 MG tablet, Take one tablet as needed for yeast.   glucose blood (FREESTYLE LITE) test strip, Check blood sugar 3 times a day or as directed. DX-E11.22   Insulin Syringe-Needle U-100 (BD VEO INSULIN SYRINGE U/F) 31G X 15/64" 0.5 ML MISC, USE 2 X /DAY FOR INSULIN/DISPENSE INSURANCE PREFERENCE DX: E11.22   ketoconazole (NIZORAL) 2 % shampoo, APPLY 1 APPLICATION TOPICALLY 2 (TWO) TIMES A WEEK. APPLY & SHAMPOO 2 X /WEEK   senna-docusate (SENOKOT-S) 8.6-50 MG tablet, Take by mouth.   tamsulosin (FLOMAX) 0.4 MG CAPS capsule, Take 0.4 mg by mouth daily.   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   Observations/Objective:  BP 112/78   Pulse 79   Temp 97.9 F (36.6 C)   Resp 17   Ht 5\' 10"  (1.778 m)   Wt 260 lb 3.2 oz (118 kg)   SpO2 99%   BMI 37.33 kg/m   HEENT - WNL. SKIN - An area of ~ 2.5" x 3.5" of the right parietal scalp is slightly raised or "swollen" and tender. No vesiculation or rash is noted and no pustules are seen.  No lymphangitic streaking and no noted sub-occipital or post auricular lymph nodes are appreciated.  Assessment and Plan:  1. Cellulitis of scalp  - dexamethasone (DECADRON) 2 MG tablet; Take 1 tab 3 x day - 3 days, then 2 x day - 3 days, then 1 tab daily  Dispense: 20 tablet; Refill: 1  - doxycycline (VIBRAMYCIN) 100 MG capsule; Take 1 capsule 2 x /day with meals for Infection  Dispense: 30 capsule; Refill: 1   Follow Up Instructions:      I discussed the assessment and treatment plan with the patient. The patient was provided an opportunity to ask questions and all were answered. The patient agreed with the plan and demonstrated an understanding of the instructions.       The patient was advised to call back or seek an in-person evaluation if the symptoms worsen or if the condition fails to improve as anticipated.   Kirtland Bouchard, MD

## 2021-05-26 ENCOUNTER — Ambulatory Visit: Payer: BC Managed Care – PPO | Attending: Internal Medicine

## 2021-05-26 ENCOUNTER — Other Ambulatory Visit: Payer: Self-pay

## 2021-05-26 ENCOUNTER — Other Ambulatory Visit (HOSPITAL_BASED_OUTPATIENT_CLINIC_OR_DEPARTMENT_OTHER): Payer: Self-pay

## 2021-05-26 DIAGNOSIS — Z23 Encounter for immunization: Secondary | ICD-10-CM

## 2021-05-26 MED ORDER — COVID-19 MRNA VACC (MODERNA) 100 MCG/0.5ML IM SUSP
INTRAMUSCULAR | 0 refills | Status: DC
  Filled 2021-05-26: qty 0.25, 1d supply, fill #0

## 2021-05-26 NOTE — Progress Notes (Signed)
   Covid-19 Vaccination Clinic  Name:  Joshia Kitchings    MRN: [id number] DOB: Apr 20, 1961  05/26/2021  Mr. [name] was observed post Covid-19 immunization for 15 minutes without incident. He was provided with Vaccine Information Sheet and instruction to access the V-Safe system.   Mr. [name] was instructed to call 911 with any severe reactions post vaccine: Difficulty breathing  Swelling of face and throat  A fast heartbeat  A bad rash all over body  Dizziness and weakness   Immunizations Administered     Name Date Dose VIS Date Route   Moderna Covid-19 Booster Vaccine 05/26/2021 12:58 PM 0.25 mL 09/28/2020 Intramuscular   Manufacturer: Moderna   Lot: [id number]   [place]: [id number]

## 2021-05-31 ENCOUNTER — Other Ambulatory Visit: Payer: Self-pay

## 2021-05-31 ENCOUNTER — Ambulatory Visit (INDEPENDENT_AMBULATORY_CARE_PROVIDER_SITE_OTHER): Payer: BC Managed Care – PPO

## 2021-05-31 DIAGNOSIS — E349 Endocrine disorder, unspecified: Secondary | ICD-10-CM | POA: Diagnosis not present

## 2021-05-31 NOTE — Progress Notes (Signed)
Patient here for a Testosterone injection 200 mg/ml  2 ml IM injection in RIGHT upper outer quadrant. Patient tolerated well and will return in 2 weeks for next injection.

## 2021-06-21 ENCOUNTER — Other Ambulatory Visit: Payer: Self-pay

## 2021-06-21 ENCOUNTER — Ambulatory Visit (INDEPENDENT_AMBULATORY_CARE_PROVIDER_SITE_OTHER): Payer: BC Managed Care – PPO

## 2021-06-21 DIAGNOSIS — E349 Endocrine disorder, unspecified: Secondary | ICD-10-CM | POA: Diagnosis not present

## 2021-06-21 DIAGNOSIS — IMO0002 Reserved for concepts with insufficient information to code with codable children: Secondary | ICD-10-CM

## 2021-06-21 DIAGNOSIS — E1129 Type 2 diabetes mellitus with other diabetic kidney complication: Secondary | ICD-10-CM

## 2021-06-21 MED ORDER — TESTOSTERONE CYPIONATE 200 MG/ML IM SOLN
200.0000 mg | Freq: Once | INTRAMUSCULAR | Status: AC
  Administered 2021-06-21: 200 mg via INTRAMUSCULAR

## 2021-06-21 MED ORDER — OZEMPIC (0.25 OR 0.5 MG/DOSE) 2 MG/1.5ML ~~LOC~~ SOPN: PEN_INJECTOR | SUBCUTANEOUS | 0 refills | Status: DC

## 2021-06-21 NOTE — Progress Notes (Signed)
Patient here for a Testosterone injection 200 mg/ml  2 ml IM injection in LEFT upper outer quadrant. Patient tolerated well and will return in 2 weeks for next injection.

## 2021-06-28 ENCOUNTER — Other Ambulatory Visit: Payer: Self-pay | Admitting: Adult Health

## 2021-06-28 DIAGNOSIS — IMO0002 Reserved for concepts with insufficient information to code with codable children: Secondary | ICD-10-CM

## 2021-06-28 DIAGNOSIS — E1129 Type 2 diabetes mellitus with other diabetic kidney complication: Secondary | ICD-10-CM

## 2021-06-28 DIAGNOSIS — E1165 Type 2 diabetes mellitus with hyperglycemia: Secondary | ICD-10-CM

## 2021-07-05 ENCOUNTER — Other Ambulatory Visit: Payer: Self-pay

## 2021-07-05 ENCOUNTER — Ambulatory Visit (INDEPENDENT_AMBULATORY_CARE_PROVIDER_SITE_OTHER): Payer: BC Managed Care – PPO

## 2021-07-05 VITALS — HR 86 | Temp 97.7°F | Wt 262.8 lb

## 2021-07-05 DIAGNOSIS — E349 Endocrine disorder, unspecified: Secondary | ICD-10-CM | POA: Diagnosis not present

## 2021-07-05 MED ORDER — TESTOSTERONE CYPIONATE 200 MG/ML IM SOLN
400.0000 mg | Freq: Once | INTRAMUSCULAR | Status: AC
  Administered 2021-07-05: 400 mg via INTRAMUSCULAR

## 2021-07-05 NOTE — Progress Notes (Signed)
Patient here for a Testosterone injection 200 mg/ml  2 ml IM injection in RIGHT upper outer quadrant. Patient tolerated well and will return in 2 weeks for next injection.

## 2021-07-06 ENCOUNTER — Other Ambulatory Visit: Payer: Self-pay | Admitting: Adult Health Nurse Practitioner

## 2021-07-10 ENCOUNTER — Other Ambulatory Visit: Payer: Self-pay | Admitting: Adult Health

## 2021-07-10 DIAGNOSIS — IMO0002 Reserved for concepts with insufficient information to code with codable children: Secondary | ICD-10-CM

## 2021-07-10 DIAGNOSIS — E1129 Type 2 diabetes mellitus with other diabetic kidney complication: Secondary | ICD-10-CM

## 2021-07-10 DIAGNOSIS — E1165 Type 2 diabetes mellitus with hyperglycemia: Secondary | ICD-10-CM

## 2021-07-10 MED ORDER — OZEMPIC (0.25 OR 0.5 MG/DOSE) 2 MG/1.5ML ~~LOC~~ SOPN: PEN_INJECTOR | SUBCUTANEOUS | 0 refills | Status: DC

## 2021-07-14 ENCOUNTER — Other Ambulatory Visit: Payer: Self-pay | Admitting: Adult Health Nurse Practitioner

## 2021-07-14 DIAGNOSIS — I10 Essential (primary) hypertension: Secondary | ICD-10-CM

## 2021-07-19 ENCOUNTER — Ambulatory Visit: Payer: BC Managed Care – PPO

## 2021-08-10 ENCOUNTER — Other Ambulatory Visit: Payer: Self-pay | Admitting: Adult Health

## 2021-08-10 DIAGNOSIS — E1165 Type 2 diabetes mellitus with hyperglycemia: Secondary | ICD-10-CM

## 2021-08-10 DIAGNOSIS — E1129 Type 2 diabetes mellitus with other diabetic kidney complication: Secondary | ICD-10-CM

## 2021-08-10 DIAGNOSIS — IMO0002 Reserved for concepts with insufficient information to code with codable children: Secondary | ICD-10-CM

## 2021-08-10 MED ORDER — OZEMPIC (1 MG/DOSE) 4 MG/3ML ~~LOC~~ SOPN: 1.0000 mg | PEN_INJECTOR | SUBCUTANEOUS | 1 refills | Status: DC

## 2021-08-16 DIAGNOSIS — N210 Calculus in bladder: Secondary | ICD-10-CM | POA: Diagnosis not present

## 2021-08-21 DIAGNOSIS — R82994 Hypercalciuria: Secondary | ICD-10-CM | POA: Diagnosis not present

## 2021-08-21 DIAGNOSIS — R351 Nocturia: Secondary | ICD-10-CM | POA: Diagnosis not present

## 2021-08-21 DIAGNOSIS — N401 Enlarged prostate with lower urinary tract symptoms: Secondary | ICD-10-CM | POA: Diagnosis not present

## 2021-08-21 DIAGNOSIS — N210 Calculus in bladder: Secondary | ICD-10-CM | POA: Diagnosis not present

## 2021-08-23 ENCOUNTER — Ambulatory Visit: Payer: BC Managed Care – PPO | Admitting: Adult Health

## 2021-08-24 ENCOUNTER — Encounter: Payer: Self-pay | Admitting: Gastroenterology

## 2021-09-02 ENCOUNTER — Other Ambulatory Visit: Payer: Self-pay | Admitting: Adult Health

## 2021-09-05 DIAGNOSIS — N179 Acute kidney failure, unspecified: Secondary | ICD-10-CM | POA: Diagnosis not present

## 2021-09-05 DIAGNOSIS — I10 Essential (primary) hypertension: Secondary | ICD-10-CM | POA: Diagnosis not present

## 2021-09-05 DIAGNOSIS — E119 Type 2 diabetes mellitus without complications: Secondary | ICD-10-CM | POA: Diagnosis not present

## 2021-09-05 DIAGNOSIS — S37019A Minor contusion of unspecified kidney, initial encounter: Secondary | ICD-10-CM | POA: Diagnosis not present

## 2021-09-22 DIAGNOSIS — Z23 Encounter for immunization: Secondary | ICD-10-CM | POA: Diagnosis not present

## 2021-09-28 ENCOUNTER — Ambulatory Visit: Payer: BC Managed Care – PPO | Admitting: Adult Health

## 2021-10-02 ENCOUNTER — Other Ambulatory Visit: Payer: Self-pay

## 2021-10-02 ENCOUNTER — Ambulatory Visit: Payer: BC Managed Care – PPO | Attending: Internal Medicine

## 2021-10-02 ENCOUNTER — Other Ambulatory Visit (HOSPITAL_BASED_OUTPATIENT_CLINIC_OR_DEPARTMENT_OTHER): Payer: Self-pay

## 2021-10-02 DIAGNOSIS — Z23 Encounter for immunization: Secondary | ICD-10-CM

## 2021-10-02 MED ORDER — MODERNA COVID-19 BIVAL BOOSTER 50 MCG/0.5ML IM SUSP
INTRAMUSCULAR | 0 refills | Status: DC
  Filled 2021-10-02: qty 0.5, 1d supply, fill #0

## 2021-10-02 NOTE — Progress Notes (Signed)
   Covid-19 Vaccination Clinic  Name:  [name]    MRN: [id number] DOB: July 22, 1961  10/02/2021  Mr. Russell [name] was observed post Covid-19 immunization for 15 minutes without incident. He was provided with Vaccine Information Sheet and instruction to access the V-Safe system.   Mr. Russell [name] was instructed to call 911 with any severe reactions post vaccine: Difficulty breathing  Swelling of face and throat  A fast heartbeat  A bad rash all over body  Dizziness and weakness   Immunizations Administered     Name Date Dose VIS Date Route   Moderna Covid-19 vaccine Bivalent Booster 10/02/2021 11:29 AM 0.5 mL 07/22/2021 Intramuscular   Manufacturer: Moderna   Lot: [id number]   [place]: [id number]

## 2021-10-19 ENCOUNTER — Encounter: Payer: Self-pay | Admitting: Internal Medicine

## 2021-12-12 ENCOUNTER — Other Ambulatory Visit: Payer: Self-pay | Admitting: Physician Assistant

## 2021-12-16 ENCOUNTER — Other Ambulatory Visit: Payer: Self-pay | Admitting: Adult Health

## 2021-12-19 ENCOUNTER — Other Ambulatory Visit: Payer: Self-pay

## 2021-12-19 ENCOUNTER — Encounter: Payer: Self-pay | Admitting: Gastroenterology

## 2021-12-19 MED ORDER — FREESTYLE LIBRE 14 DAY SENSOR MISC: 99 refills | Status: DC

## 2021-12-27 ENCOUNTER — Encounter: Payer: Self-pay | Admitting: Neurology

## 2021-12-27 ENCOUNTER — Ambulatory Visit: Payer: BC Managed Care – PPO | Admitting: Neurology

## 2021-12-27 VITALS — BP 112/66 | HR 76 | Ht 70.0 in | Wt 253.8 lb

## 2021-12-27 DIAGNOSIS — G4733 Obstructive sleep apnea (adult) (pediatric): Secondary | ICD-10-CM

## 2021-12-27 DIAGNOSIS — R519 Headache, unspecified: Secondary | ICD-10-CM | POA: Diagnosis not present

## 2021-12-27 DIAGNOSIS — E669 Obesity, unspecified: Secondary | ICD-10-CM

## 2021-12-27 DIAGNOSIS — G4719 Other hypersomnia: Secondary | ICD-10-CM

## 2021-12-27 DIAGNOSIS — R351 Nocturia: Secondary | ICD-10-CM

## 2021-12-27 NOTE — Progress Notes (Signed)
Subjective:    Patient ID: Russell [name] is a 61 y.o. male.  HPI    Star Age, MD, PhD [facility] [address]O. [place], [place] [address]  Dear Caryl Pina,   I saw your patient, Russell [name], upon your kind request in my sleep clinic today for initial consultation of his sleep disorder, in particular, evaluation of his prior diagnosis of obstructive sleep apnea.  The patient is unaccompanied today.  As you know, Russell [name] is a 61 year old right-handed gentleman with an underlying medical history of diabetes, hypertension, hyperlipidemia, hypogonadism, sinusitis, allergic rhinitis, and obesity, who reports snoring, excessive daytime somnolence and recurrent headaches.  He was previously diagnosed with obstructive sleep apnea and placed on positive airway pressure treatment.  He had a home sleep test on 01/07/2013 which indicated severe obstructive sleep apnea with an AHI of 35.3/h, average oxygen saturation was 92%, nadir was 73%. His CPAP machine was recalled. He stopped using his machine in April 2022.  He had a Programmer, systems.  He did not bring his machine.  He will see reports that sometimes it would blow very hard and sometimes it would randomly turn off.  He got his original machine which is an AutoPap machine in 2014 and also got original supplies and never actually had replacement supplies, he did not establish with the DME company.  He used a full facemask.  Bedtime is around 1 AM and rise time around 5:30 AM.  He has used Afrin in the past for nasal congestion but no longer uses Afrin on a regular basis.  He drinks caffeine in the form of coffee occasionally, none daily.  He drinks alcohol occasionally.  He quit smoking over 20 years ago.  He lives with his wife, he has 3 grown children and 4 grandchildren, youngest daughter is 44 and is in Sports coach school in [place].  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  He wakes up with a dry mouth.  When he was on AutoPap therapy he had good results overall.  He is not aware of any family history of sleep apnea.  He has experienced morning headaches and has nocturia about once or twice per average night.   I reviewed your office note from 05/17/2021.  His Epworth sleepiness score is 14 out of 24 fatigue severity score is 23 out of 63.   His Past Medical History Is Significant For: Past Medical History:  Diagnosis Date   Allergic rhinitis    Allergy    Chronic sinusitis    Diabetes mellitus ([place])    Hyperlipidemia    Hypertension    Hypogonadism male    Perinephric hematoma 07/16/2015    His Past Surgical History Is Significant For: Past Surgical History:  Procedure Laterality Date   COLONOSCOPY     HERNIA REPAIR     ROTATOR CUFF REPAIR Left 2017 sept 5   WISDOM TOOTH EXTRACTION      His Family History Is Significant For: Family History  Problem Relation Age of Onset   Diabetes Mother    Hypertension Mother    Hyperlipidemia Mother    Myelodysplastic syndrome Mother    Diabetes Father    Hypertension Father    Hyperlipidemia Father    Cancer Father 92       kidney   Diabetes Brother    Hypertension Brother    Diabetes Mellitus [name] Paternal Merchant navy officer  Colon cancer Neg Hx    Esophageal cancer Neg Hx    Rectal cancer Neg Hx    Stomach cancer Neg Hx    Liver cancer Neg Hx    Pancreatic cancer Neg Hx    Prostate cancer Neg Hx    Sleep apnea Neg Hx     His Social History Is Significant For: Social History   Socioeconomic History   Marital status: Divorced    Spouse name: Not on file   Number of children: Not on file   Years of education: Not on file   Highest education level: Not on file  Occupational History   Not on file  Tobacco Use   Smoking status: Former    Packs/day: 0.30    Years: 8.00    Pack years: 2.40    Types: Cigarettes    Quit date: 12/10/2000    Years since quitting: 21.0   Smokeless  tobacco: Never  Vaping Use   Vaping Use: Never used  Substance and Sexual Activity   Alcohol use: Yes    Comment: occ   Drug use: No   Sexual activity: Yes    Partners: Female    Birth control/protection: None  Other Topics Concern   Not on file  Social History Narrative   Not on file   Social Determinants of Health   Financial Resource Strain: Not on file  Food Insecurity: Not on file  Transportation Needs: Not on file  Physical Activity: Not on file  Stress: Not on file  Social Connections: Not on file    His Allergies Are:  Allergies  Allergen Reactions   Other     Ppd Black Rubber Mix   Ppd [Tuberculin Purified Protein Derivative] Other (See Comments)    POSITIVE REACTION IN PAST 11/21/11 CXR NEG  :   His Current Medications Are:  Outpatient Encounter Medications as of 12/27/2021  Medication Sig   amLODipine (NORVASC) 10 MG tablet TAKE 1 TABLET DAILY FOR BLOOD PRESSURE   atorvastatin (LIPITOR) 20 MG tablet TAKE 1 TABLET BY MOUTH EVERY DAY FOR CHOLESTEROL   bisoprolol (ZEBETA) 10 MG tablet Take 1  tablet  Daily  for BP / Patient knows to take by mouth   ciclesonide (OMNARIS) 50 MCG/ACT nasal spray Place 2 sprays into both nostrils daily as needed for allergies.    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 Use as directed every 10 days. May dispense FreeStyle [employer] or similar.   COVID-19 mRNA bivalent vaccine, Moderna, (MODERNA COVID-19 BIVAL BOOSTER) 50 MCG/0.5ML injection Inject into the muscle.   COVID-19 mRNA vaccine, Moderna, 100 MCG/0.5ML injection Inject into the muscle.   dexamethasone (DECADRON) 2 MG tablet Take 1 tab 3 x day - 3 days, then 2 x day - 3 days, then 1 tab daily   doxycycline  (VIBRAMYCIN) 100 MG capsule Take 1 capsule 2 x /day with meals for Infection   ezetimibe (ZETIA) 10 MG tablet TAKE 1 TABLET BY DAILY FOR CHOLESTEROL   fluconazole (DIFLUCAN) 150 MG tablet Take one tablet as needed for yeast.   glucose blood (FREESTYLE LITE) test strip Check blood sugar 3 times a day or as directed. DX-E11.22   indapamide (LOZOL) 1.25 MG tablet Take 1.25 mg  by mouth daily.   insulin NPH-regular Human (NOVOLIN 70/30) (70-30) 100 UNIT/ML injection Inject up to 30 Units 2 x /day with Meals for Diabetes   Insulin Syringe-Needle U-100 (INSULIN SYRINGE .5CC/31GX5/16") 31G X 5/16" 0.5 ML MISC USE TWICE DAILY FOR INSULIN INJECTIONS   ketoconazole (NIZORAL) 2 % shampoo APPLY 1 APPLICATION TOPICALLY 2 (TWO) TIMES A WEEK. APPLY & SHAMPOO 2 X /WEEK   metFORMIN (GLUCOPHAGE-XR) 500 MG 24 hr tablet Take     2 tablets     2 x /day       with Meals       for Diabetes (Dx - e11.9)   Semaglutide, 1 MG/DOSE, (OZEMPIC, 1 MG/DOSE,) 4 MG/3ML SOPN Inject 1 mg into the skin once a week.   senna-docusate (SENOKOT-S) 8.6-50 MG tablet Take by mouth.   tadalafil (CIALIS) 20 MG tablet Take 1/2 to 1 tablet every 2-3 days as needed (Patient taking differently: Take 10-20 mg by mouth daily as needed for erectile dysfunction. Take 1/2 to 1 tablet every 2-3 days as needed)   tamsulosin (FLOMAX) 0.4 MG CAPS capsule Take 0.4 mg by mouth daily.   telmisartan (MICARDIS) 40 MG tablet TAKE 1 TABLET DAILY FOR BLOOD PRESSURE & DIABETIC KIDNEY PROTECTION   testosterone cypionate (DEPOTESTOSTERONE CYPIONATE) 200 MG/ML injection Inject 2 ml into Muscle every 2 weeks   Vitamin D, Ergocalciferol, (DRISDOL) 1.25 MG (50000 UNIT) CAPS capsule Take 1 capsule 2 x /week on Mon - Thurs   Facility-Administered Encounter Medications as of 12/27/2021  Medication   testosterone cypionate (DEPOTESTOSTERONE CYPIONATE) injection 200 mg   testosterone cypionate (DEPOTESTOSTERONE CYPIONATE) injection 200 mg   testosterone cypionate  (DEPOTESTOSTERONE CYPIONATE) injection 200 mg   testosterone cypionate (DEPOTESTOSTERONE CYPIONATE) injection 200 mg  :   Review of Systems:  Out of a complete 14 point review of systems, all are reviewed and negative with the exception of these symptoms as listed below:  Review of Systems  Neurological:        Pt is here for sleep consult  Pt states he had a sleep study done 10 years ago pt  does have a CPAP (phillips) pt stop using it April 2022 because of recall of CPAP Pt states that he is getting headaches in the am and snoring and some fatigue. . Pt states he has sore throats and dry mouth when he wakes up in the morning.   ESS:14 FSS:23    Objective:  Neurological Exam  Physical Exam Physical Examination:   Vitals:   12/27/21 1014  BP: 112/66  Pulse: 76    General Examination: The patient is a very pleasant 61 y.o. male in no acute distress. He appears well-developed and well-nourished and well groomed.   HEENT: Normocephalic, atraumatic, pupils are equal, round and reactive to light, extraocular tracking is good without limitation to gaze excursion or nystagmus noted. Hearing is grossly intact. Face is symmetric with normal facial animation. Speech is clear with no dysarthria noted. There is no hypophonia. There is no lip, neck/head, jaw or voice tremor. Neck is supple with full range of passive and active motion. There are no carotid bruits on auscultation. Oropharynx exam reveals: Mild mouth dryness, good dental.  Moderate airway crowding.  Neck circumference of 18 and three-quarter inches.  Tongue protrudes centrally and palate elevates symmetrically.   Chest: Clear to auscultation without wheezing, rhonchi or crackles noted.  Heart: S1+S2+0, regular and normal without murmurs, rubs or gallops noted.   Abdomen: Soft, non-tender and non-distended.  Extremities: There is no pitting edema in the distal lower extremities bilaterally.   Skin: Warm and dry without  trophic changes noted.   Musculoskeletal: exam reveals no obvious joint deformities.   Neurologically:  Mental status: The patient is awake, alert and oriented in all 4 spheres. His immediate and remote memory, attention, language skills and fund of knowledge are appropriate. There is no evidence of aphasia, agnosia, apraxia or anomia. Speech is clear with normal prosody and enunciation. Thought process is linear. Mood is normal and affect is normal.  Cranial nerves [name] - XII are as described above under HEENT exam.  Motor exam: Normal bulk, strength and tone is noted. There is no tremor. Fine motor skills and coordination: grossly intact.  Cerebellar testing: No dysmetria or intention tremor. There is no truncal or gait ataxia.  Sensory exam: intact to light touch in the upper and lower extremities.  Gait, station and balance: He stands easily. No veering to one side is noted. No leaning to one side is noted. Posture is age-appropriate and stance is narrow based. Gait shows normal stride length and normal pace. No problems turning are noted.   Assessment and Plan:  In summary, Russell [name] is a very pleasant 61 y.o.-year old male with an underlying medical history of diabetes, hypertension, hyperlipidemia, hypogonadism, sinusitis, allergic rhinitis, and obesity, who presents for evaluation of his obstructive sleep apnea.  He was diagnosed with severe obstructive sleep apnea in 2014.  He has had some weight fluctuation.  He quit using his AutoPap machine in April 2022.   I had a long chat with the patient about my findings and the diagnosis of OSA, its prognosis and treatment options. We talked about medical treatments, surgical interventions and non-pharmacological approaches. I explained in particular the risks and ramifications of untreated moderate to severe OSA, especially with respect to developing cardiovascular disease down the Road, including congestive heart failure, difficult to treat  hypertension, cardiac arrhythmias, or stroke. Even type 2 diabetes has, in part, been linked to untreated OSA. Symptoms of untreated OSA include daytime sleepiness, memory problems, mood irritability and mood disorder such as depression and anxiety, lack of energy, as well as recurrent headaches, especially morning headaches. We talked about trying to maintain a healthy lifestyle in general, as well as the importance of weight control. We also talked about the importance of good sleep hygiene. I recommended the following at this time: sleep study.  I outlined the differences between a laboratory attended sleep study versus home sleep test.  I explained the sleep test procedure to the patient and also outlined possible surgical and non-surgical treatment options of OSA, including the use of a custom-made dental device (which would require a referral to a specialist dentist or oral surgeon), upper airway surgical options, such as traditional UPPP or a novel less invasive surgical option in the form of Inspire hypoglossal nerve stimulation (which would involve a referral to an ENT surgeon). I also explained the CPAP treatment option to the patient, who indicated that he would be willing to continue with positive airway pressure treatment.  He should be eligible for new equipment.  We will have him get plugged in with a local DME company. We will keep him posted as to his test results by phone call and plan a follow-up in this clinic accordingly.  I answered all his questions today and he was in agreement.   Thank you very much for allowing me to participate in the care of this  nice patient. If I can be of any further assistance to you please do not hesitate to call me at [phone].  Sincerely,   Star Age, MD, PhD

## 2021-12-27 NOTE — Patient Instructions (Signed)
Thank you for choosing [facility] for your sleep related care! It was nice to meet you today! I appreciate that you entrust me with your sleep related healthcare concerns. I hope, I was able to address at least some of your concerns today, and that I can help you feel reassured and also get better.    Here is what we discussed today and what we came up with as our plan for you:    Based on your symptoms and your exam I believe you are still at risk for obstructive sleep apnea and would benefit from re-evaluation as it has been many years and you need a new machine. Therefore, I think we should proceed with a sleep study to determine how severe your sleep apnea is. If you have more than mild OSA, I want you to consider ongoing treatment with CPAP. Please remember, the risks and ramifications of moderate to severe obstructive sleep apnea or OSA are: Cardiovascular disease, including congestive heart failure, stroke, difficult to control hypertension, arrhythmias, and even type 2 diabetes has been linked to untreated OSA. Sleep apnea causes disruption of sleep and sleep deprivation in most cases, which, in turn, can cause recurrent headaches, problems with memory, mood, concentration, focus, and vigilance. Most people with untreated sleep apnea report excessive daytime sleepiness, which can affect their ability to drive. Please do not drive if you feel sleepy.   I will likely see you back after your sleep study to go over the test results and where to go from there. We will call you after your sleep study to advise about the results (most likely, you will hear from [place], my nurse) and to set up an appointment at the time, as necessary.    Our sleep lab administrative assistant will call you to schedule your sleep study. If you don't hear back from her by about 2 weeks from now, please feel free to call her at [phone]. You can leave a message with your phone number and concerns, if  you get the voicemail box. She will call back as soon as possible.

## 2022-01-19 ENCOUNTER — Telehealth: Payer: Self-pay

## 2022-01-19 NOTE — Telephone Encounter (Signed)
-----   Message from Yetta Flock, MD sent at 01/19/2022  1:19 PM EST ----- Regarding: RE: direct colon? I guess it depends if he is having any symptoms.  Not sure if or when to reach out and see if there is any specific symptoms he wants to be evaluated for?  I do not see any significant cardiac history in the chart for him but if he wants to see me in the office first that is fine. ----- Message ----- From: Roetta Sessions, CMA Sent: 01/19/2022   7:00 AM EST To: Yetta Flock, MD Subject: direct colon?                                  Please review. Should this patient be scheduled for direct colon? He was due last year.  On your schedule for Monday, 2-13 OV  Thx, Jan

## 2022-01-19 NOTE — Telephone Encounter (Signed)
Called patient and LM asking him to clarify if he wanted to come in for OV or be direct booked for colonoscopy since he is overdue and having some bleeding.

## 2022-01-22 ENCOUNTER — Encounter: Payer: Self-pay | Admitting: Gastroenterology

## 2022-01-22 ENCOUNTER — Ambulatory Visit: Payer: BC Managed Care – PPO | Admitting: Gastroenterology

## 2022-01-22 VITALS — BP 114/70 | HR 68 | Ht 70.0 in | Wt 252.5 lb

## 2022-01-22 DIAGNOSIS — Z86010 Personal history of colonic polyps: Secondary | ICD-10-CM

## 2022-01-22 DIAGNOSIS — K625 Hemorrhage of anus and rectum: Secondary | ICD-10-CM

## 2022-01-22 DIAGNOSIS — K602 Anal fissure, unspecified: Secondary | ICD-10-CM | POA: Diagnosis not present

## 2022-01-22 DIAGNOSIS — L309 Dermatitis, unspecified: Secondary | ICD-10-CM

## 2022-01-22 DIAGNOSIS — K5900 Constipation, unspecified: Secondary | ICD-10-CM | POA: Diagnosis not present

## 2022-01-22 MED ORDER — PLENVU 140 G PO SOLR: 1.0000 | ORAL | 0 refills | Status: DC

## 2022-01-22 MED ORDER — AMBULATORY NON FORMULARY MEDICATION: 0 refills | Status: AC

## 2022-01-22 MED ORDER — CITRUCEL PO POWD: 1.0000 | Freq: Every day | ORAL | Status: DC

## 2022-01-22 NOTE — Patient Instructions (Addendum)
If you are age 60 or older, your body mass index should be between 23-30. Your Body mass index is 36.23 kg/m. If this is out of the aforementioned range listed, please consider follow up with your Primary Care Provider.  If you are age 68 or younger, your body mass index should be between 19-25. Your Body mass index is 36.23 kg/m. If this is out of the aformentioned range listed, please consider follow up with your Primary Care Provider.   ________________________________________________________  The  GI providers would like to encourage you to use [facility] to communicate with providers for non-urgent requests or questions.  Due to long hold times on the telephone, sending your provider a message by [facility] may be a faster and more efficient way to get a response.  Please allow 48 business hours for a response.  Please remember that this is for non-urgent requests.  _______________________________________________________  Russell [name] have been scheduled for a colonoscopy. Please follow written instructions given to you at your visit today.  Please pick up your prep supplies at the pharmacy within the next 1-3 days. If you use inhalers (even only as needed), please bring them with you on the day of your procedure.   We have sent the following prescription to [facility] (located at [address] [place], [place], [place] [address]), for you to pick up at your convenience:  Diltiazem gel with 2% lidocaine  Apply a pea sized amount into your rectum three times daily for 4 to 6 weeks or until healed  Please purchase the following medications over the counter and take as directed: Citrucel: use as directed once daily Desitin cream: apply to the perianal area daily as needed  Thank you for entrusting me with your care and for choosing [place], Dr. La Rose Cellar

## 2022-01-22 NOTE — Progress Notes (Signed)
HPI :  61 year old male who was last seen in our office in May 2019, here to reestablish his care, referred by Dr. Unk Pinto to discuss surveillance colonoscopy, as well as rectal bleeding and pain.  He had a colonoscopy with me in May 2019 showing 3 small adenomas as well as internal hemorrhoids.  He has no family history of colon cancer.  He reports since the spring and summer of last year he has had intermittent rectal bleeding.  Specifically after passing a hard stool he will feel pain in his perianal area and he states it looks like a "fresh cut" and sees bright red blood on the toilet paper and somewhat in the toilet.  He uses senna every few days or so does not take anything routinely.  He states about 50% of his stools are hard and has some straining.  No abdominal pains.  He denies any change in his health otherwise, has been feeling well at baseline.  No cardiopulmonary symptoms.  He does have diabetes and history of hypertension.   Colonoscopy 04/28/2018 - The perianal and digital rectal examinations were normal. - A 3 mm polyp was found in the ascending colon found in the retroflexed position. The polyp was sessile. The polyp was removed with a cold biopsy forceps. Resection and retrieval were complete. - Two sessile polyps were found in the descending colon. The polyps were 3 mm in size. These polyps were removed with a cold snare. Resection and retrieval were complete. - Internal hemorrhoids were found during retroflexion. - The exam was otherwise without abnormality.  Surgical [P], descending and ascending, polyp (2) - TUBULAR ADENOMA (X3 FRAGMENTS). - NO HIGH GRADE DYSPLASIA OR MALIGNANCY.  Past Medical History:  Diagnosis Date   Allergic rhinitis    Allergy    Chronic sinusitis    Diabetes mellitus ([place])    Hyperlipidemia    Hypertension    Hypogonadism male    Perinephric hematoma 07/16/2015     Past Surgical History:  Procedure Laterality Date   COLONOSCOPY      HERNIA REPAIR     ROTATOR CUFF REPAIR Left 2017 sept 26   WISDOM TOOTH EXTRACTION     Family History  Problem Relation Age of Onset   Diabetes Mother    Hypertension Mother    Hyperlipidemia Mother    Myelodysplastic syndrome Mother    Diabetes Father    Hypertension Father    Hyperlipidemia Father    Cancer Father 17       kidney   Diabetes Brother    Hypertension Brother    Diabetes Mellitus II Paternal Grandfather    Colon cancer Neg Hx    Esophageal cancer Neg Hx    Rectal cancer Neg Hx    Stomach cancer Neg Hx    Liver cancer Neg Hx    Pancreatic cancer Neg Hx    Prostate cancer Neg Hx    Sleep apnea Neg Hx    Social History   Tobacco Use   Smoking status: Former    Packs/day: 0.30    Years: 8.00    Pack years: 2.40    Types: Cigarettes    Quit date: 12/10/2000    Years since quitting: 21.1   Smokeless tobacco: Never  Vaping Use   Vaping Use: Never used  Substance Use Topics   Alcohol use: Yes    Comment: occ   Drug use: No   Current Outpatient Medications  Medication Sig Dispense Refill   amLODipine (  NORVASC) 10 MG tablet TAKE 1 TABLET DAILY FOR BLOOD PRESSURE 90 tablet 3   atorvastatin (LIPITOR) 20 MG tablet TAKE 1 TABLET BY MOUTH EVERY DAY FOR CHOLESTEROL 90 tablet 3   bisoprolol (ZEBETA) 10 MG tablet Take 1  tablet  Daily  for BP / Patient knows to take by mouth 90 tablet 3   ciclesonide (OMNARIS) 50 MCG/ACT nasal spray Place 2 sprays into both nostrils daily as needed for allergies.      clotrimazole-betamethasone (LOTRISONE) cream APPLY 3 TIMES A DAY AS NEEDED 30 g 1   Continuous Blood Gluc Sensor (FREESTYLE LIBRE 14 DAY SENSOR) MISC Apply 1 sensor once every 2 weeks DX Z79.4 insulin dependent diabetes 2 each PRN   Continuous Blood Gluc Sensor MISC 1 each by Does not apply route as directed. Use as directed every 10 days. May dispense FreeStyle [employer] or similar. 10 each 6   ezetimibe (ZETIA) 10 MG tablet TAKE 1 TABLET BY DAILY FOR  CHOLESTEROL 90 tablet 1   fluconazole (DIFLUCAN) 150 MG tablet Take one tablet as needed for yeast. 7 tablet 0   glucose blood (FREESTYLE LITE) test strip Check blood sugar 3 times a day or as directed. DX-E11.22 300 each 3   indapamide (LOZOL) 1.25 MG tablet Take 1.25 mg by mouth daily.     insulin NPH-regular Human (NOVOLIN 70/30) (70-30) 100 UNIT/ML injection Inject up to 30 Units 2 x /day with Meals for Diabetes 90 mL 1   Insulin Syringe-Needle U-100 (INSULIN SYRINGE .5CC/31GX5/16") 31G X 5/16" 0.5 ML MISC USE TWICE DAILY FOR INSULIN INJECTIONS 100 each 3   ketoconazole (NIZORAL) 2 % shampoo APPLY 1 APPLICATION TOPICALLY 2 (TWO) TIMES A WEEK. APPLY & SHAMPOO 2 X /WEEK 120 mL 12   metFORMIN (GLUCOPHAGE-XR) 500 MG 24 hr tablet Take     2 tablets     2 x /day       with Meals       for Diabetes (Dx - e11.9) 360 tablet 3   Semaglutide, 1 MG/DOSE, (OZEMPIC, 1 MG/DOSE,) 4 MG/3ML SOPN Inject 1 mg into the skin once a week. 9 mL 1   senna-docusate (SENOKOT-S) 8.6-50 MG tablet Take 1 tablet by mouth every other day.     tamsulosin (FLOMAX) 0.4 MG CAPS capsule Take 0.4 mg by mouth daily.     telmisartan (MICARDIS) 40 MG tablet TAKE 1 TABLET DAILY FOR BLOOD PRESSURE & DIABETIC KIDNEY PROTECTION 90 tablet 3   testosterone cypionate (DEPOTESTOSTERONE CYPIONATE) 200 MG/ML injection Inject 2 ml into Muscle every 2 weeks 10 mL 2   Current Facility-Administered Medications  Medication Dose Route Frequency Provider Last Rate Last Admin   testosterone cypionate (DEPOTESTOSTERONE CYPIONATE) injection 200 mg  200 mg Intramuscular Q14 Days Vladimir Crofts, PA-C   200 mg at 09/08/19 1137   testosterone cypionate (DEPOTESTOSTERONE CYPIONATE) injection 200 mg  200 mg Intramuscular Q14 Days Vladimir Crofts, PA-C   200 mg at 05/20/20 1059   testosterone cypionate (DEPOTESTOSTERONE CYPIONATE) injection 200 mg  200 mg Intramuscular Q14 Days Vladimir Crofts, PA-C   200 mg at 07/29/20 1032   testosterone cypionate  (DEPOTESTOSTERONE CYPIONATE) injection 200 mg  200 mg Intramuscular Q14 Days McClanahan, Danton Sewer, NP   200 mg at 05/31/21 8657   Allergies  Allergen Reactions   Other     Ppd Black Rubber Mix   Ppd [Tuberculin Purified Protein Derivative] Other (See Comments)    POSITIVE REACTION IN PAST 11/21/11 CXR NEG  Review of Systems: All systems reviewed and negative except where noted in HPI.    Lab Results  Component Value Date   WBC 5.1 05/17/2021   HGB 14.1 05/17/2021   HCT 44.1 05/17/2021   MCV 83.4 05/17/2021   PLT 324 05/17/2021    Lab Results  Component Value Date   CREATININE 1.20 05/17/2021   BUN 27 (H) 05/17/2021   NA 138 05/17/2021   K 4.2 05/17/2021   CL 101 05/17/2021   CO2 29 05/17/2021    Lab Results  Component Value Date   ALT 16 05/17/2021   AST 12 05/17/2021   ALKPHOS 77 05/30/2017   BILITOT 0.5 05/17/2021     Physical Exam: BP 114/70    Pulse 68    Ht 5\' 10"  (1.778 m)    Wt 252 lb 8 oz (114.5 kg)    BMI 36.23 kg/m  Constitutional: Pleasant,well-developed, male in no acute distress. HEENT: Normocephalic and atraumatic. Conjunctivae are normal. No scleral icterus. Neck supple.  Cardiovascular: Normal rate, regular rhythm.  Pulmonary/chest: Effort normal and breath sounds normal.  Abdominal: Soft, nondistended, nontender. There are no masses palpable.  Perianal exam -  Leon Standby - posterior midline anal fissure, perianal dermatitis noted on posterior side only. Internal DRE not done due to fissure noted Extremities: no edema Lymphadenopathy: No cervical adenopathy noted. Neurological: Alert and oriented to person place and time. Skin: Skin is warm and dry. No rashes noted. Psychiatric: Normal mood and affect. Behavior is normal.   ASSESSMENT AND PLAN: 61 year old male here to reestablish care for the following:  Rectal bleeding Constipation Anal fissure Perianal dermatitis History of colon polyps  Reviewed his history and exam  findings.  He has a posterior midline anal fissure which I suspect is the cause of his rectal pain and bleeding.  We discussed what this is.  Recommend Citrucel once daily to keep stools soft and prevent straining, minimize time spent on the toilet.  Recommended diltiazem/lidocaine ointment, apply pea-sized amount PR 3 times daily for 4 weeks or so or until fissure has healed.  He does have perianal dermatitis along the posterior midline perianal area.  Recommend some Desitin cream applied to the area a few times daily, if no better he should contact us.  We discussed his colonoscopy results and when he wanted to have a surveillance colonoscopy, guidelines recommend anywhere between 3 and 5 years from his last exam.  Given his recent bleeding he wanted to proceed with that now, about 4 years from his last exam, will wait a month or 2 and allow him to heal up from his fissure.  Discussed risks and benefits and he wants to proceed.  Plan: - start Citrucel once daily - start Diltiazem / lidocaine ointment peas sized amount PR TID for 4 weeks or until fissure healed - start Desitin cream to perianal area for rash - schedule Colonoscopy at the [place], MD [facility]  CC: Unk Pinto, MD

## 2022-01-26 ENCOUNTER — Other Ambulatory Visit: Payer: Self-pay | Admitting: Internal Medicine

## 2022-01-26 DIAGNOSIS — Z794 Long term (current) use of insulin: Secondary | ICD-10-CM

## 2022-01-31 ENCOUNTER — Other Ambulatory Visit: Payer: Self-pay | Admitting: Internal Medicine

## 2022-01-31 DIAGNOSIS — E1122 Type 2 diabetes mellitus with diabetic chronic kidney disease: Secondary | ICD-10-CM

## 2022-02-05 ENCOUNTER — Ambulatory Visit (INDEPENDENT_AMBULATORY_CARE_PROVIDER_SITE_OTHER): Payer: BC Managed Care – PPO | Admitting: Neurology

## 2022-02-05 DIAGNOSIS — G4719 Other hypersomnia: Secondary | ICD-10-CM

## 2022-02-05 DIAGNOSIS — R519 Headache, unspecified: Secondary | ICD-10-CM

## 2022-02-05 DIAGNOSIS — G4733 Obstructive sleep apnea (adult) (pediatric): Secondary | ICD-10-CM | POA: Diagnosis not present

## 2022-02-05 DIAGNOSIS — E669 Obesity, unspecified: Secondary | ICD-10-CM

## 2022-02-05 DIAGNOSIS — R351 Nocturia: Secondary | ICD-10-CM

## 2022-02-07 NOTE — Addendum Note (Signed)
Addended by: Star Age on: 02/07/2022 05:55 PM   Modules accepted: Orders

## 2022-02-07 NOTE — Procedures (Signed)
° °  [facility]  HOME SLEEP TEST (Watch PAT) REPORT  STUDY DATE: 02/05/2022  DOB: September 01, 1961  MRN: [id number]  ORDERING CLINICIAN: Star Age, MD, PhD   REFERRING CLINICIAN: Liane Comber, NP  CLINICAL INFORMATION/HISTORY: 61 year old right-handed gentleman with an underlying medical history of diabetes, hypertension, hyperlipidemia, hypogonadism, sinusitis, allergic rhinitis, and obesity, who reports snoring, excessive daytime somnolence and recurrent headaches.  He was previously diagnosed with obstructive sleep apnea and placed on positive airway pressure treatment.  He stopped using his positive airway pressure device about a year ago due to a recall of the machine.  Epworth sleepiness score: 14/24.  BMI: 36.3 kg/m  FINDINGS:   Sleep Summary:   Total Recording Time (hours, min): 8 hours, 16 minutes  Total Sleep Time (hours, min):  6 hours, 45 minutes   Percent REM (%):    22.8%   Respiratory Indices:   Calculated pAHI (per hour):  24.1/hour         REM pAHI:    35.2/hour       NREM pAHI: 20.8/hour  Oxygen Saturation Statistics:    Oxygen Saturation (%) Mean: 94%   Minimum oxygen saturation (%):                 81%   O2 Saturation Range (%): 81-99%    O2 Saturation (minutes) <=88%: 3.1 min  Pulse Rate Statistics:   Pulse Mean (bpm):    74/min    Pulse Range (57-119/min)   IMPRESSION: OSA (obstructive sleep apnea)   RECOMMENDATION:  This home sleep test demonstrates moderate obstructive sleep apnea with a total AHI of 24.1/hour and O2 nadir of 81%.  Mild to moderate snoring was detected, at times louder.  Treatment with positive airway pressure is recommended. The patient will be advised to proceed with an autoPAP titration/trial at home for now. A full night titration study may be considered to optimize treatment settings, if needed down the road. Please note that untreated obstructive sleep apnea may carry additional perioperative  morbidity. Patients with significant obstructive sleep apnea should receive perioperative PAP therapy and the surgeons and particularly the anesthesiologist should be informed of the diagnosis and the severity of the sleep disordered breathing.  Alternative treatment options may include dental treatment with an oral appliance through dentistry or orthodontics, or surgical treatment with inspire, hypoglossal nerve stimulator.  Concomitant weight loss is recommended. The patient should be cautioned not to drive, work at heights, or operate dangerous or heavy equipment when tired or sleepy. Review and reiteration of good sleep hygiene measures should be pursued with any patient. Other causes of the patient's symptoms, including circadian rhythm disturbances, an underlying mood disorder, medication effect and/or an underlying medical problem cannot be ruled out based on this test. Clinical correlation is recommended. The patient and his referring provider will be notified of the test results. The patient will be seen in follow up in sleep clinic at [facility].  I certify that I have reviewed the raw data recording prior to the issuance of this report in accordance with the standards of the American Academy of Sleep Medicine (AASM).   INTERPRETING PHYSICIAN:   Star Age, MD, PhD  Board Certified in Neurology and Sleep Medicine  [facility] [address] [place],  [address] [phone]

## 2022-02-07 NOTE — Progress Notes (Signed)
° °  [facility]  HOME SLEEP TEST (Watch PAT) REPORT  STUDY DATE: 02/05/2022  DOB: 01/02/1961  MRN: [id number]  ORDERING CLINICIAN: Star Age, MD, PhD   REFERRING CLINICIAN: Liane Comber, NP  CLINICAL INFORMATION/HISTORY: 61 year old right-handed gentleman with an underlying medical history of diabetes, hypertension, hyperlipidemia, hypogonadism, sinusitis, allergic rhinitis, and obesity, who reports snoring, excessive daytime somnolence and recurrent headaches.  He was previously diagnosed with obstructive sleep apnea and placed on positive airway pressure treatment.  He stopped using his positive airway pressure device about a year ago due to a recall of the machine.  Epworth sleepiness score: 14/24.  BMI: 36.3 kg/m  FINDINGS:   Sleep Summary:   Total Recording Time (hours, min): 8 hours, 16 minutes  Total Sleep Time (hours, min):  6 hours, 45 minutes   Percent REM (%):    22.8%   Respiratory Indices:   Calculated pAHI (per hour):  24.1/hour         REM pAHI:    35.2/hour       NREM pAHI: 20.8/hour  Oxygen Saturation Statistics:    Oxygen Saturation (%) Mean: 94%   Minimum oxygen saturation (%):                 81%   O2 Saturation Range (%): 81-99%    O2 Saturation (minutes) <=88%: 3.1 min  Pulse Rate Statistics:   Pulse Mean (bpm):    74/min    Pulse Range (57-119/min)   IMPRESSION: OSA (obstructive sleep apnea)   RECOMMENDATION:  This home sleep test demonstrates moderate obstructive sleep apnea with a total AHI of 24.1/hour and O2 nadir of 81%.  Mild to moderate snoring was detected, at times louder.  Treatment with positive airway pressure is recommended. The patient will be advised to proceed with an autoPAP titration/trial at home for now. A full night titration study may be considered to optimize treatment settings, if needed down the road. Please note that untreated obstructive sleep apnea may carry additional perioperative  morbidity. Patients with significant obstructive sleep apnea should receive perioperative PAP therapy and the surgeons and particularly the anesthesiologist should be informed of the diagnosis and the severity of the sleep disordered breathing.  Alternative treatment options may include dental treatment with an oral appliance through dentistry or orthodontics, or surgical treatment with inspire, hypoglossal nerve stimulator.  Concomitant weight loss is recommended. The patient should be cautioned not to drive, work at heights, or operate dangerous or heavy equipment when tired or sleepy. Review and reiteration of good sleep hygiene measures should be pursued with any patient. Other causes of the patient's symptoms, including circadian rhythm disturbances, an underlying mood disorder, medication effect and/or an underlying medical problem cannot be ruled out based on this test. Clinical correlation is recommended. The patient and his referring provider will be notified of the test results. The patient will be seen in follow up in sleep clinic at [facility].  I certify that I have reviewed the raw data recording prior to the issuance of this report in accordance with the standards of the American Academy of Sleep Medicine (AASM).   INTERPRETING PHYSICIAN:   Star Age, MD, PhD  Board Certified in Neurology and Sleep Medicine  [facility] [address] [place], [place] [address] [phone]

## 2022-02-08 ENCOUNTER — Telehealth: Payer: Self-pay | Admitting: *Deleted

## 2022-02-08 DIAGNOSIS — G4733 Obstructive sleep apnea (adult) (pediatric): Secondary | ICD-10-CM

## 2022-02-08 NOTE — Telephone Encounter (Signed)
-----   Message from Star Age, MD sent at 02/07/2022  5:55 PM EST ----- ?Patient referred by his primary care nurse practitioner and I saw him on 12/27/2021.  He carries a prior diagnosis of severe obstructive sleep apnea.  He had a home sleep test on 02/05/2022.  He has not been using his CPAP for the past nearly 1 year -it was affected by the recall.  Please call patient and advise him that his home sleep test indicated moderate obstructive sleep apnea, I would like to write for a new AutoPap machine. ?We can stay with his current DME company if he would like.  He will need a follow-up within 3 months in sleep clinic for compliance check.  Please reinforce importance of staying compliant with treatment.  He may have to call us to set up his follow-up appointment once he has a new machine issued.   ?

## 2022-02-08 NOTE — Telephone Encounter (Signed)
Spoke with the patient and discussed his sleep study results.  Patient aware his home sleep test indicated moderate obstructive sleep apnea.  Dr. Rexene Alberts has written for a new AutoPap machine.  Patient could not recall who his DME company was.  We discussed options.  Will go with [facility].  Patient scheduled for follow-up on Monday, May 8 at 7:45 AM.  Patient aware to bring his machine and power cord to the visit.  We discussed insurance compliance requirements which includes using the machine at least 4 hours at night.  He will contact us with any future questions.  He verbalized appreciation. ? ?Order and supporting information faxed to [place].  Received a receipt of confirmation. Result sent to PCP. Follow-up letter sent to pt via mychart.  ?

## 2022-02-12 DIAGNOSIS — N210 Calculus in bladder: Secondary | ICD-10-CM | POA: Diagnosis not present

## 2022-02-15 ENCOUNTER — Telehealth: Payer: Self-pay | Admitting: Neurology

## 2022-02-15 DIAGNOSIS — G4733 Obstructive sleep apnea (adult) (pediatric): Secondary | ICD-10-CM | POA: Diagnosis not present

## 2022-02-15 NOTE — Telephone Encounter (Signed)
Pt was scheduled for Initial CPAP visit on 04/16/22. ?Pt was informed to bring machine and power cord to appt. ?Pt to scheduled between: 03/17/22-05/16/22 ?

## 2022-02-19 NOTE — Telephone Encounter (Signed)
Received this fax from [place]:  ? ?Resmed Airsense 11 auto ?Setup 02/15/22 (appt needed 03/19/22-05/18/22) ?

## 2022-03-04 ENCOUNTER — Encounter: Payer: Self-pay | Admitting: Certified Registered Nurse Anesthetist

## 2022-03-06 ENCOUNTER — Encounter: Payer: Self-pay | Admitting: Gastroenterology

## 2022-03-07 ENCOUNTER — Other Ambulatory Visit: Payer: Self-pay | Admitting: Nurse Practitioner

## 2022-03-11 ENCOUNTER — Encounter: Payer: Self-pay | Admitting: Certified Registered Nurse Anesthetist

## 2022-03-12 ENCOUNTER — Encounter: Payer: Self-pay | Admitting: Gastroenterology

## 2022-03-12 ENCOUNTER — Telehealth: Payer: Self-pay | Admitting: *Deleted

## 2022-03-12 ENCOUNTER — Ambulatory Visit (AMBULATORY_SURGERY_CENTER): Payer: BC Managed Care – PPO | Admitting: Gastroenterology

## 2022-03-12 VITALS — BP 93/57 | HR 58 | Temp 98.4°F | Resp 12 | Ht 70.0 in | Wt 252.0 lb

## 2022-03-12 DIAGNOSIS — Z86010 Personal history of colonic polyps: Secondary | ICD-10-CM

## 2022-03-12 MED ORDER — AMBULATORY NON FORMULARY MEDICATION: 0 refills | Status: AC

## 2022-03-12 MED ORDER — SODIUM CHLORIDE 0.9 % IV SOLN: 500.0000 mL | Freq: Once | INTRAVENOUS | Status: DC

## 2022-03-12 NOTE — Progress Notes (Signed)
[facility] History and Physical ? ? ?Primary Care Physician:  Unk Pinto, MD ? ? ?Reason for Procedure:   History of colon polyps ? ?Plan:    colonoscopy ? ? ? ? ?HPI: Russell [name] is a 61 y.o. male  here for colonoscopy surveillance - 3 adenomas, all small - last done 04/2018. Also recently had rectal bleeding attributed to anal fissure in recent months which has since resolved with therapy. Patient denies any bowel symptoms at this time. No family history of colon cancer known. Otherwise feels well without any cardiopulmonary symptoms.  ? ? ?Past Medical History:  ?Diagnosis Date  ? Allergic rhinitis   ? Allergy   ? Chronic sinusitis   ? Diabetes mellitus ([place])   ? Hyperlipidemia   ? Hypertension   ? Hypogonadism male   ? Perinephric hematoma 07/16/2015  ? ? ?Past Surgical History:  ?Procedure Laterality Date  ? COLONOSCOPY    ? HERNIA REPAIR    ? ROTATOR CUFF REPAIR Left 2017 sept 5  ? WISDOM TOOTH EXTRACTION    ? ? ?Prior to Admission medications   ?Medication Sig Start Date End Date Taking? Authorizing Provider  ?amLODipine (NORVASC) 10 MG tablet TAKE 1 TABLET DAILY FOR BLOOD PRESSURE 02/20/21  Yes McClanahan, Kyra, NP  ?atorvastatin (LIPITOR) 20 MG tablet TAKE 1 TABLET BY MOUTH EVERY DAY FOR CHOLESTEROL 07/06/21  Yes Magda Bernheim, NP  ?bisoprolol (ZEBETA) 10 MG tablet Take 1  tablet  Daily  for BP / Patient knows to take by mouth 07/14/21  Yes Unk Pinto, MD  ?clotrimazole-betamethasone (LOTRISONE) cream APPLY 3 TIMES A DAY AS NEEDED 12/20/20  Yes Liane Comber, NP  ?Continuous Blood Gluc Sensor (FREESTYLE LIBRE 14 DAY SENSOR) MISC Apply 1 sensor once every 2 weeks DX Z79.4 insulin dependent diabetes 12/19/21  Yes Unk Pinto, MD  ?Continuous Blood Gluc Sensor MISC 1 each by Does not apply route as directed. Use as directed every 10 days. May dispense FreeStyle [employer] or similar. 05/01/18  Yes Unk Pinto, MD  ?ezetimibe (ZETIA) 10 MG tablet TAKE 1 TABLET BY DAILY FOR  CHOLESTEROL 03/07/22  Yes Magda Bernheim, NP  ?glucose blood (FREESTYLE LITE) test strip Check blood sugar 3 times a day or as directed. WC-H85.27 11/20/17  Yes Unk Pinto, MD  ?indapamide (LOZOL) 1.25 MG tablet Take 1.25 mg by mouth daily. 10/08/20  Yes [provider]  ?insulin NPH-regular Human (NOVOLIN 70/30) (70-30) 100 UNIT/ML injection INJECT UP TO 50 UNITS 2 X /DAY WITH MEALS FOR DIABETES 01/26/22  Yes Magda Bernheim, NP  ?Insulin Syringe-Needle U-100 (INSULIN SYRINGE .5CC/31GX5/16") 31G X 5/16" 0.5 ML MISC USE TWICE DAILY FOR INSULIN INJECTIONS 12/18/21  Yes Magda Bernheim, NP  ?metFORMIN (GLUCOPHAGE-XR) 500 MG 24 hr tablet TAKE 2 TABLETS TWICE DAILY WITH MEALS FOR DIABETES (DX - E11.9) 01/31/22  Yes Unk Pinto, MD  ?senna-docusate (SENOKOT-S) 8.6-50 MG tablet Take 1 tablet by mouth every other day. 08/15/16  Yes [provider]  ?tamsulosin (FLOMAX) 0.4 MG CAPS capsule Take 0.4 mg by mouth daily. 09/22/20  Yes [provider]  ?telmisartan (MICARDIS) 40 MG tablet TAKE 1 TABLET DAILY FOR BLOOD PRESSURE & DIABETIC KIDNEY PROTECTION 02/20/21  Yes Garnet Sierras, NP  ?AMBULATORY NON FORMULARY MEDICATION Diltiazem gel with 2% lidocaine  ?Apply a pea sized amount into your rectum three times daily ?Dispense 30 GM zero refill 01/22/22   Verma Grothaus, Carlota Raspberry, MD  ?ciclesonide (OMNARIS) 50 MCG/ACT nasal spray Place 2 sprays  into both nostrils daily as needed for allergies.     [provider]  ?fluconazole (DIFLUCAN) 150 MG tablet Take one tablet as needed for yeast. 02/23/21   McClanahan, Danton Sewer, NP  ?ketoconazole (NIZORAL) 2 % shampoo APPLY 1 APPLICATION TOPICALLY 2 (TWO) TIMES A WEEK. APPLY & SHAMPOO 2 X /WEEK 09/14/19   Unk Pinto, MD  ?methylcellulose (CITRUCEL) oral powder Take 1 packet by mouth daily. 01/22/22   Jaquaya Coyle, Carlota Raspberry, MD  ?Semaglutide, 1 MG/DOSE, (OZEMPIC, 1 MG/DOSE,) 4 MG/3ML SOPN Inject 1 mg into the skin once a week. ?Patient not taking: Reported on  03/12/2022 08/10/21   Liane Comber, NP  ?testosterone cypionate (DEPOTESTOSTERONE CYPIONATE) 200 MG/ML injection Inject 2 ml into Muscle every 2 weeks ?Patient not taking: Reported on 03/12/2022 11/08/20   Garnet Sierras, NP  ? ? ?Current Outpatient Medications  ?Medication Sig Dispense Refill  ? amLODipine (NORVASC) 10 MG tablet TAKE 1 TABLET DAILY FOR BLOOD PRESSURE 90 tablet 3  ? atorvastatin (LIPITOR) 20 MG tablet TAKE 1 TABLET BY MOUTH EVERY DAY FOR CHOLESTEROL 90 tablet 3  ? bisoprolol (ZEBETA) 10 MG tablet Take 1  tablet  Daily  for BP / Patient knows to take by mouth 90 tablet 3  ? clotrimazole-betamethasone (LOTRISONE) cream APPLY 3 TIMES A DAY AS NEEDED 30 g 1  ? Continuous Blood Gluc Sensor (FREESTYLE LIBRE 14 DAY SENSOR) MISC Apply 1 sensor once every 2 weeks DX Z79.4 insulin dependent diabetes 2 each PRN  ? Continuous Blood Gluc Sensor MISC 1 each by Does not apply route as directed. Use as directed every 10 days. May dispense FreeStyle [employer] or similar. 10 each 6  ? ezetimibe (ZETIA) 10 MG tablet TAKE 1 TABLET BY DAILY FOR CHOLESTEROL 90 tablet 1  ? glucose blood (FREESTYLE LITE) test strip Check blood sugar 3 times a day or as directed. DX-E11.22 300 each 3  ? indapamide (LOZOL) 1.25 MG tablet Take 1.25 mg by mouth daily.    ? insulin NPH-regular Human (NOVOLIN 70/30) (70-30) 100 UNIT/ML injection INJECT UP TO 50 UNITS 2 X /DAY WITH MEALS FOR DIABETES 90 mL 1  ? Insulin Syringe-Needle U-100 (INSULIN SYRINGE .5CC/31GX5/16") 31G X 5/16" 0.5 ML MISC USE TWICE DAILY FOR INSULIN INJECTIONS 100 each 3  ? metFORMIN (GLUCOPHAGE-XR) 500 MG 24 hr tablet TAKE 2 TABLETS TWICE DAILY WITH MEALS FOR DIABETES (DX - E11.9) 360 tablet 3  ? senna-docusate (SENOKOT-S) 8.6-50 MG tablet Take 1 tablet by mouth every other day.    ? tamsulosin (FLOMAX) 0.4 MG CAPS capsule Take 0.4 mg by mouth daily.    ? telmisartan (MICARDIS) 40 MG tablet TAKE 1 TABLET DAILY FOR BLOOD PRESSURE & DIABETIC KIDNEY PROTECTION 90  tablet 3  ? AMBULATORY NON FORMULARY MEDICATION Diltiazem gel with 2% lidocaine  ?Apply a pea sized amount into your rectum three times daily ?Dispense 30 GM zero refill 30 g 0  ? ciclesonide (OMNARIS) 50 MCG/ACT nasal spray Place 2 sprays into both nostrils daily as needed for allergies.     ? fluconazole (DIFLUCAN) 150 MG tablet Take one tablet as needed for yeast. 7 tablet 0  ? ketoconazole (NIZORAL) 2 % shampoo APPLY 1 APPLICATION TOPICALLY 2 (TWO) TIMES A WEEK. APPLY & SHAMPOO 2 X /WEEK 120 mL 12  ? methylcellulose (CITRUCEL) oral powder Take 1 packet by mouth daily.    ? Semaglutide, 1 MG/DOSE, (OZEMPIC, 1 MG/DOSE,) 4 MG/3ML SOPN Inject 1 mg into the skin once a week. (Patient not  taking: Reported on 03/12/2022) 9 mL 1  ? testosterone cypionate (DEPOTESTOSTERONE CYPIONATE) 200 MG/ML injection Inject 2 ml into Muscle every 2 weeks (Patient not taking: Reported on 03/12/2022) 10 mL 2  ? ?Current Facility-Administered Medications  ?Medication Dose Route Frequency Provider Last Rate Last Admin  ? 0.9 %  sodium chloride infusion  500 mL Intravenous Once Patsy Zaragoza, Carlota Raspberry, MD      ? testosterone cypionate (DEPOTESTOSTERONE CYPIONATE) injection 200 mg  200 mg Intramuscular Q14 Days Vladimir Crofts, PA-C   200 mg at 09/08/19 1137  ? testosterone cypionate (DEPOTESTOSTERONE CYPIONATE) injection 200 mg  200 mg Intramuscular Q14 Days Vladimir Crofts, PA-C   200 mg at 05/20/20 1059  ? testosterone cypionate (DEPOTESTOSTERONE CYPIONATE) injection 200 mg  200 mg Intramuscular Q14 Days Vladimir Crofts, PA-C   200 mg at 07/29/20 1032  ? testosterone cypionate (DEPOTESTOSTERONE CYPIONATE) injection 200 mg  200 mg Intramuscular Q14 Days McClanahan, Kyra, NP   200 mg at 05/31/21 5176  ? ? ?Allergies as of 03/12/2022 - Review Complete 03/12/2022  ?Allergen Reaction Noted  ? Other  12/27/2021  ? Ppd [tuberculin purified protein derivative] Other (See Comments) 10/22/2013  ? ? ?Family History  ?Problem Relation Age of Onset  ?  Diabetes Mother   ? Hypertension Mother   ? Hyperlipidemia Mother   ? Myelodysplastic syndrome Mother   ? Diabetes Father   ? Hypertension Father   ? Hyperlipidemia Father   ? Cancer Father 22  ?     kidn

## 2022-03-12 NOTE — Telephone Encounter (Signed)
Called pt to let him know that we called his RX for Diltiazem in to [facility].  LM on VM for patient to call back with any questions. Russell [name]   ?

## 2022-03-12 NOTE — Op Note (Signed)
[place] ?Patient Name: Russell [name] ?Procedure Date: 03/12/2022 1:40 PM ?MRN: [id number] ?Endoscopist: Carlota Raspberry. Havery Moros , MD ?Age: 62 ?Referring MD:  ?Date of Birth: 06-23-1961 ?Gender: Male ?Account #: [id number] ?Procedure:                Colonoscopy ?Indications:              High risk colon cancer surveillance: Personal  ?                          history of colonic polyps - 3 adenomas removed  ?                          04/2018 - also with recent rectal bleeding thought  ?                          to be due to fissure - treated and resolved ?Medicines:                Monitored Anesthesia Care ?Procedure:                Pre-Anesthesia Assessment: ?                          - Prior to the procedure, a History and Physical  ?                          was performed, and patient medications and  ?                          allergies were reviewed. The patient's tolerance of  ?                          previous anesthesia was also reviewed. The risks  ?                          and benefits of the procedure and the sedation  ?                          options and risks were discussed with the patient.  ?                          All questions were answered, and informed consent  ?                          was obtained. Prior Anticoagulants: The patient has  ?                          taken no previous anticoagulant or antiplatelet  ?                          agents. ASA Grade Assessment: [name] - A patient with  ?                          mild systemic disease. After reviewing the risks  ?  and benefits, the patient was deemed in  ?                          satisfactory condition to undergo the procedure. ?                          After obtaining informed consent, the colonoscope  ?                          was passed under direct vision. Throughout the  ?                          procedure, the patient's blood pressure, pulse, and  ?                          oxygen saturations  were monitored continuously. The  ?                          CF [id number] #[id number] was introduced through the anus  ?                          and advanced to the the terminal ileum, with  ?                          identification of the appendiceal orifice and IC  ?                          valve. The colonoscopy was performed without  ?                          difficulty. The patient tolerated the procedure  ?                          well. The quality of the bowel preparation was  ?                          good. The terminal ileum, ileocecal valve,  ?                          appendiceal orifice, and rectum were photographed. ?Scope In: 1:49:03 PM ?Scope Out: 2:06:44 PM ?Scope Withdrawal Time: 0 hours 14 minutes 28 seconds  ?Total Procedure Duration: 0 hours 17 minutes 41 seconds  ?Findings:                 The perianal exam findings include mild perianal  ?                          skin breakdown in gluteal cleft. Interval healing  ?                          of anal fissure ?                          The terminal ileum appeared normal. ?  Internal hemorrhoids were found during  ?                          retroflexion. The hemorrhoids were small. ?                          The exam was otherwise without abnormality. ?Complications:            No immediate complications. Estimated blood loss:  ?                          None. ?Estimated Blood Loss:     Estimated blood loss: none. ?Impression:               - Perianal skin breakdown (mild) found on perianal  ?                          exam. ?                          - Interval healing of anal fissure ?                          - The examined portion of the ileum was normal. ?                          - Internal hemorrhoids. ?                          - The examination was otherwise normal. ?                          - No polyps ?Recommendation:           - Patient has a contact number available for  ?                          emergencies.  The signs and symptoms of potential  ?                          delayed complications were discussed with the  ?                          patient. Return to normal activities tomorrow.  ?                          Written discharge instructions were provided to the  ?                          patient. ?                          - Resume previous diet. ?                          - Continue present medications. ?                          -  Use barrier skin cream as previously discussed,  ?                          follow up with PCP if perianal skin changes persist ?                          - Repeat colonoscopy in 10 years for surveillance. ?Carlota Raspberry. Havery Moros, MD ?03/12/2022 2:12:11 PM ?This report has been signed electronically. ?

## 2022-03-12 NOTE — Progress Notes (Signed)
Prescription for Diltiazem called in to [facility] by Randall Hiss, RN ?

## 2022-03-12 NOTE — Patient Instructions (Addendum)
Handout given. Continue to use Desitin , Miralax and Diltiazam/lidocaine cream. ? ? ?YOU HAD AN ENDOSCOPIC PROCEDURE TODAY AT [facility]:   Refer to the procedure report that was given to you for any specific questions about what was found during the examination.  If the procedure report does not answer your questions, please call your gastroenterologist to clarify.  If you requested that your care partner not be given the details of your procedure findings, then the procedure report has been included in a sealed envelope for you to review at your convenience later. ? ?YOU SHOULD EXPECT: Some feelings of bloating in the abdomen. Passage of more gas than usual.  Walking can help get rid of the air that was put into your GI tract during the procedure and reduce the bloating. If you had a lower endoscopy (such as a colonoscopy or flexible sigmoidoscopy) you may notice spotting of blood in your stool or on the toilet paper. If you underwent a bowel prep for your procedure, you may not have a normal bowel movement for a few days. ? ?Please Note:  You might notice some irritation and congestion in your nose or some drainage.  This is from the oxygen used during your procedure.  There is no need for concern and it should clear up in a day or so. ? ?SYMPTOMS TO REPORT IMMEDIATELY: ? ?Following lower endoscopy (colonoscopy or flexible sigmoidoscopy): ? Excessive amounts of blood in the stool ? Significant tenderness or worsening of abdominal pains ? Swelling of the abdomen that is new, acute ? Fever of 100?F or higher ? ? ?For urgent or emergent issues, a gastroenterologist can be reached at any hour by calling [phone]. ?Do not use MyChart messaging for urgent concerns.  ? ? ?DIET:  We do recommend a small meal at first, but then you may proceed to your regular diet.  Drink plenty of fluids but you should avoid alcoholic beverages for 24 hours. ? ?ACTIVITY:  You should plan to take it easy for the  rest of today and you should NOT DRIVE or use heavy machinery until tomorrow (because of the sedation medicines used during the test).   ? ?FOLLOW UP: ?Our staff will call the number listed on your records 48-72 hours following your procedure to check on you and address any questions or concerns that you may have regarding the information given to you following your procedure. If we do not reach you, we will leave a message.  We will attempt to reach you two times.  During this call, we will ask if you have developed any symptoms of COVID 19. If you develop any symptoms (ie: fever, flu-like symptoms, shortness of breath, cough etc.) before then, please call [phone].  If you test positive for Covid 19 in the 2 weeks post procedure, please call and report this information to [place].   ? ?If any biopsies were taken you will be contacted by phone or by letter within the next 1-3 weeks.  Please call us at [phone] if you have not heard about the biopsies in 3 weeks.  ? ? ?SIGNATURES/CONFIDENTIALITY: ?You and/or your care partner have signed paperwork which will be entered into your electronic medical record.  These signatures attest to the fact that that the information above on your After Visit Summary has been reviewed and is understood.  Full responsibility of the confidentiality of this discharge information lies with you and/or your care-partner.  ?

## 2022-03-12 NOTE — Progress Notes (Signed)
Sedate, gd SR, tolerated procedure well, VSS, report to RN 

## 2022-03-13 DIAGNOSIS — R351 Nocturia: Secondary | ICD-10-CM | POA: Diagnosis not present

## 2022-03-13 DIAGNOSIS — N5201 Erectile dysfunction due to arterial insufficiency: Secondary | ICD-10-CM | POA: Diagnosis not present

## 2022-03-13 DIAGNOSIS — N2889 Other specified disorders of kidney and ureter: Secondary | ICD-10-CM | POA: Diagnosis not present

## 2022-03-13 DIAGNOSIS — N401 Enlarged prostate with lower urinary tract symptoms: Secondary | ICD-10-CM | POA: Diagnosis not present

## 2022-03-14 ENCOUNTER — Other Ambulatory Visit: Payer: Self-pay | Admitting: Urology

## 2022-03-14 ENCOUNTER — Telehealth: Payer: Self-pay | Admitting: *Deleted

## 2022-03-14 DIAGNOSIS — D4101 Neoplasm of uncertain behavior of right kidney: Secondary | ICD-10-CM

## 2022-03-14 NOTE — Telephone Encounter (Signed)
?  Follow up Call- ? ? ?  03/12/2022  ?  1:23 PM  ?Call back number  ?Post procedure Call Back phone  # [phone]  ?Permission to leave phone message Yes  ?  ? ?Patient questions: ? ?Do you have a fever, pain , or abdominal swelling? No. ?Pain Score  0 * ? ?Have you tolerated food without any problems? Yes.   ? ?Have you been able to return to your normal activities? Yes.   ? ?Do you have any questions about your discharge instructions: ?Diet   No. ?Medications  No. ?Follow up visit  No. ? ?Do you have questions or concerns about your Care? No. ? ?Actions: ?* If pain score is 4 or above: ?No action needed, pain <4. ? ? ?

## 2022-03-18 DIAGNOSIS — G4733 Obstructive sleep apnea (adult) (pediatric): Secondary | ICD-10-CM | POA: Diagnosis not present

## 2022-04-03 ENCOUNTER — Other Ambulatory Visit: Payer: Self-pay | Admitting: Internal Medicine

## 2022-04-03 DIAGNOSIS — I10 Essential (primary) hypertension: Secondary | ICD-10-CM

## 2022-04-03 DIAGNOSIS — E119 Type 2 diabetes mellitus without complications: Secondary | ICD-10-CM

## 2022-04-03 DIAGNOSIS — E1122 Type 2 diabetes mellitus with diabetic chronic kidney disease: Secondary | ICD-10-CM

## 2022-04-03 DIAGNOSIS — N183 Chronic kidney disease, stage 3 unspecified: Secondary | ICD-10-CM

## 2022-04-03 DIAGNOSIS — E1159 Type 2 diabetes mellitus with other circulatory complications: Secondary | ICD-10-CM

## 2022-04-14 NOTE — Progress Notes (Signed)
? ?Future Appointments  ?Date Time Provider Department  ?04/16/2022  7:45 AM Star Age, MD [facility]  ?04/17/2022  9:30 AM Unk Pinto, MD [facility]  ?04/18/2022  3:10 PM [facility]315 MR 1 [facility]315[facility]  ?05/17/2022                     CPE  10:00 AM Liane Comber, NP [facility]  ? ? ?History of Present Illness: ? ? ?    This very nice 61 y.o. 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  Patient recently was restarted in Mar 2023  back on CPAP by Dr Rexene Alberts. He also had recent Colonoscopy  in April by Dr Havery Moros recommending a 10 year f/u.  On May 10, he is scheduled for an Abd/pelvic MRI' \\by'$  Dr Jeffie Pollock.  Abd MRI in 2016 &  2021 Showed a Rt perinephric hematoma w/o definite mass. Also MRI showed Aortic Iliac Atherosclerosis.  ? ? ?    Patient is treated for HTN  since 2000  & BP has been controlled at home. Today?s BP is at goasl -  110/62. Patient has had no complaints of any cardiac type chest pain, palpitations, dyspnea / orthopnea / PND, dizziness, claudication, or dependent edema. ? ? ?    Hyperlipidemia is controlled with diet & Atorvastatin. Patient denies myalgias or other med SE?s. Last Lipids were ? ?Lab Results  ?Component Value Date  ? CHOL 121 05/17/2021  ? HDL 30 (L) 05/17/2021  ? [place] 70 05/17/2021  ? TRIG 127 05/17/2021  ? CHOLHDL 4.0 05/17/2021  ? ? ? 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  Last A1c was not at goal : ? ?Lab Results  ?Component Value Date  ? HGBA1C 8.5 (H) 05/17/2021  ? ?    ?    Patient has hx/o Testosterone Deficiency  ("198" /2008)  and had been on parenteral replacement , but apparently has self d/c'd therapy.  ? ? ?                                              Further, the patient also has  history of [name] ("28" /2008) and supplements vitamin D . Last vitamin D was at goal : ? ? ?Lab Results  ?Component Value Date  ? VD25OH 66 05/17/2021  ? ? ? ?Current Outpatient Medications on File Prior to Visit  ?Medication Sig  ? AMBULATORY NON FORMULARY MEDICATION Diltiazem gel with 2% lidocaine  ?Apply a pea sized amount into your rectum three times daily ?Dispense 30 GM zero refill  ? amLODipine (NORVASC) 10 MG tablet TAKE 1 TABLET DAILY FOR BLOOD PRESSURE  ? atorvastatin (LIPITOR) 20 MG tablet TAKE 1 TABLET  EVERY DAY FOR CHOLESTEROL  ? bisoprolol (ZEBETA) 10 MG tablet Take 1  tablet  Daily  for BP / Patient knows to take by mouth  ? ciclesonide (OMNARIS) 50 MCG/ACT nasal spray Place 2 sprays into both nostrils daily as needed for allergies.   ? clotrimazole-betamethasone (LOTRISONE) cream APPLY 3 TIMES A DAY AS NEEDED  ? ezetimibe (  ZETIA) 10 MG tablet TAKE 1 TABLET BY DAILY FOR CHOLESTEROL  ? fluconazole (DIFLUCAN) 150 MG tablet Take one tablet as needed for yeast.  ? indapamide (LOZOL) 1.25 MG tablet Take 1.25 mg by mouth daily.  ? NOVOLIN 70/30 INJECT UP TO 50 UNITS 2 X /DAY   ? ketoconazole (NIZORAL) 2 % shampoo APPLY & SHAMPOO 2 X /WEEK  ? metFORMIN -XR  500 MG 24 hr tablet TAKE 2 TABLETS TWICE DAILY   ? methylcellulose (CITRUCEL) oral powder Take 1 packet by mouth daily.  ? Semaglutide, 1 MG/DOSE, (OZEMPIC, 1 MG/DOSE,) 4 MG/3ML SOPN Inject 1 mg into the skin once a week. (Patient not taking: Reported on 03/12/2022)  ? senna-docusate (SENOKOT-S) 8.6-50 MG tablet Take 1 tablet by mouth every other day.  ? tamsulosin (FLOMAX) 0.4 MG CAPS capsule Take 0.4 mg by mouth daily.  ? telmisartan (MICARDIS) 40 MG tablet TAKE 1 TABLET DAILY   ? testosterone cypio200 MG/ML injection  (Patient not taking: Reported on 03/12/2022)  ? ? ? ?Allergies  ?Allergen Reactions  ? Other   ?  Ppd Black Rubber Mix  ? Ppd [Tuberculin Purified Protein Derivative] Other (See Comments)  ?  POSITIVE REACTION IN PAST 11/21/11  CXR NEG  ? ? ? ?PMHx:   ?Past Medical History:  ?Diagnosis Date  ? Allergic rhinitis   ? Allergy   ? Chronic sinusitis   ? Diabetes mellitus ([place])   ? Hyperlipidemia   ? Hypertension   ? Hypogonadism male   ? Perinephric hematoma 07/16/2015  ? ? ? ?Immunization History  ?Administered Date(s) Administered  ? Influenza Split 10/10/2013  ? Influenza Whole 09/08/2012  ? Influenza 09/22/2018, 09/11/2019, 09/18/2019  ? Moderna Covid-19  Bivalent Booster  10/02/2021  ? Moderna SARS-COV2 Booster Vacc 05/26/2021  ? Moderna Sars-Covid-2 Vacc 02/01/2020, 03/01/2020  ? Pneumovax - 23 12/31/2011  ? Tdap 12/19/2009, 04/08/2020  ? ? ? ?Past Surgical History:  ?Procedure Laterality Date  ? COLONOSCOPY    ? HERNIA REPAIR    ? ROTATOR CUFF REPAIR Left 2017 sept 5  ? WISDOM TOOTH EXTRACTION    ? ? ?FHx:    Reviewed / unchanged ? ?SHx:    Reviewed / unchanged  ? ?Systems Review: ? ?Constitutional: Denies fever, chills, wt changes, headaches, insomnia, fatigue, night sweats, change in appetite. ?Eyes: Denies redness, blurred vision, diplopia, discharge, itchy, watery eyes.  ?ENT: Denies discharge, congestion, post nasal drip, epistaxis, sore throat, earache, hearing loss, dental pain, tinnitus, vertigo, sinus pain, snoring.  ?CV: Denies chest pain, palpitations, irregular heartbeat, syncope, dyspnea, diaphoresis, orthopnea, PND, claudication or edema. ?Respiratory: denies cough, dyspnea, DOE, pleurisy, hoarseness, laryngitis, wheezing.  ?Gastrointestinal: Denies dysphagia, odynophagia, heartburn, reflux, water brash, abdominal pain or cramps, nausea, vomiting, bloating, diarrhea, constipation, hematemesis, melena, hematochezia  or hemorrhoids. ?Genitourinary: Denies dysuria, frequency, urgency, nocturia, hesitancy, discharge, hematuria or flank pain. ?Musculoskeletal: Denies arthralgias, myalgias, stiffness, jt. swelling, pain, limping or strain/sprain.  ?Skin: Denies pruritus, rash, hives, warts, acne, eczema or change in skin  lesion(s). ?Neuro: No weakness, tremor, incoordination, spasms, paresthesia or pain. ?Psychiatric: Denies confusion, memory loss or sensory loss. ?Endo: Denies change in weight, skin or hair change.  ?Heme/Lymph: No excessive bleeding, bruising or enlarged lymph nodes. ? ?Physical Exam ? ?BP 110/62   Pulse 72   Temp (!) 97.5 ?F (36.4 ?C)   Resp 16   Wt 252 lb (114.3 kg)   SpO2 96%   BMI 36.16 kg/m?  ? ?Appears  well nourished, well groomed  and  in no distress. ? ?Eyes: PERRLA, EOMs, conjunctiva no swelling or erythema. ?Sinuses: No frontal/maxillary tenderness ?ENT/Mouth: EAC's clear, TM's nl w/o erythema, bulging. Nares clear w/o erythema, swelling, exudates. Oropharynx clear without erythema or exudates. Oral hygiene is good. Tongue normal, non obstructing. Hearing intact.  ?Neck: Supple. Thyroid not palpable. Car 2+/2+ without bruits, nodes or JVD. ?Chest: Respirations nl with BS clear & equal w/o rales, rhonchi, wheezing or stridor.  ?Cor: Heart sounds normal w/ regular rate and rhythm without sig. murmurs, gallops, clicks or rubs. Peripheral pulses normal and equal  without edema.  ?Abdomen: Soft & bowel sounds normal. Non-tender w/o guarding, rebound, hernias, masses or organomegaly.  ?Lymphatics: Unremarkable.  ?Musculoskeletal: Full ROM all peripheral extremities, joint stability, 5/5 strength and normal gait.  ?Skin: Warm, dry without exposed rashes, lesions or ecchymosis apparent.  ?Neuro: Cranial nerves intact, reflexes equal bilaterally. Sensory-motor testing grossly intact. Tendon reflexes grossly intact.  ?Pysch: Alert & oriented x 3.  Insight and judgement nl & appropriate. No ideations. ? ?Assessment and Plan: ? ?1. Essential hypertension ? ?- Continue medication, monitor blood pressure at home.  ?- Continue DASH diet.  Reminder to go to the ER if any CP,  ?SOB, nausea, dizziness, severe HA, changes vision/speech. ?  ?- CBC with Differential/Platelet ?- COMPLETE METABOLIC PANEL WITH GFR ?-  Magnesium ?- TSH ? ?2. Hyperlipidemia associated with type 2 diabetes mellitus ([place]) ? ?- Continue diet/meds, exercise,& lifestyle modifications.  ?- Continue monitor periodic cholesterol/liver & renal functions  ?   ?- Nicoletta Dress

## 2022-04-16 ENCOUNTER — Ambulatory Visit: Payer: BC Managed Care – PPO | Admitting: Neurology

## 2022-04-16 ENCOUNTER — Encounter: Payer: Self-pay | Admitting: Neurology

## 2022-04-16 VITALS — BP 110/61 | HR 73 | Ht 70.0 in | Wt 251.0 lb

## 2022-04-16 DIAGNOSIS — G4733 Obstructive sleep apnea (adult) (pediatric): Secondary | ICD-10-CM

## 2022-04-16 DIAGNOSIS — Z9989 Dependence on other enabling machines and devices: Secondary | ICD-10-CM | POA: Diagnosis not present

## 2022-04-16 NOTE — Progress Notes (Signed)
Subjective:  ?  ?Patient ID: Russell [name] is a 61 y.o. male. ? ?HPI ? ? ? ?Interim history:  ? ?Russell [name] is a 61 year old right-handed gentleman with an underlying medical history of diabetes, hypertension, hyperlipidemia, hypogonadism, sinusitis, allergic rhinitis, and obesity, who presents for follow-up consultation of his sleep apnea after interim testing and starting treatment with new equipment.  The patient is unaccompanied today.  I first met him at the request of his primary care NP, Liane Comber, on 12/27/2021, at which time he reported a prior diagnosis of sleep apnea in 2014.  He stopped using his PAP machine in April 2022.  He had a Respironics machine.  He was advised to proceed patient with a sleep study.  He had a home sleep test on 02/05/2022 which indicated moderate obstructive sleep apnea with an AHI of 24.1/h, O2 nadir 81% with mild to moderate snoring detected primarily, at times loud.  He was advised to proceed therapy.  His set up date was 02/15/2022.  He has a ResMed air sense 11 AutoSet machine.  ? ?Today, 04/16/2022: I reviewed his AutoPap compliance data from 02/15/2022 through 03/16/2022, which is a total of 30 days, during which time he used his machine every night with percent use days greater than 4 hours at 93%, indicating excellent compliance with an average usage of 5 hours and 30 minutes, residual AHI at goal at 2.1/h, average pressure for the 95th percentile at 11.7 cm with a range of 6 to 12 cm with EPR of 2.  Leak acceptable with the 95th percentile at 2.3 L/min.  He reports sleeping more deeply and longer with his AutoPap.  He uses a large fullface mask.  He reports being a mouth breather.  He reports that his wife notices no significant snoring any longer and his family does not hear him snore.  He works 2 jobs, generally in bed between 10 PM and 10:30 PM and rise time between 3:30 AM and 5 AM typically.  He reports that his home security camera recently alerted him regarding  someone in the backyard but he did not hear the alert.  He is now changes to vibration and is also advised to increase the volume on his phone app.  He uses the humidifier but has had some mouth dryness from opening his mouth.  He tolerates the pressure.  He is motivated to continue with treatment and has had good results.  Weight has been more or less stable within 5 pounds. ? ?The patient's allergies, current medications, family history, past medical history, past social history, past surgical history and problem list were reviewed and updated as appropriate.  ? ?Previously:  ? ?12/27/21: (He) reports snoring, excessive daytime somnolence and recurrent headaches.  He was previously diagnosed with obstructive sleep apnea and placed on positive airway pressure treatment.  He had a home sleep test on 01/07/2013 which indicated severe obstructive sleep apnea with an AHI of 35.3/h, average oxygen saturation was 92%, nadir was 73%. His CPAP machine was recalled. He stopped using his machine in April 2022.  He had a Programmer, systems.  He did not bring his machine.  He will see reports that sometimes it would blow very hard and sometimes it would randomly turn off.  He got his original machine which is an AutoPap machine in 2014 and also got original supplies and never actually had replacement supplies, he did not establish with the DME company.  He used a full facemask.  Bedtime  is around 1 AM and rise time around 5:30 AM.  He has used Afrin in the past for nasal congestion but no longer uses Afrin on a regular basis.  He drinks caffeine in the form of coffee occasionally, none daily.  He drinks alcohol occasionally.  He quit smoking over 20 years ago.  He lives with his wife, he has 3 grown children and 4 grandchildren, youngest daughter is 53 and is in Sports coach school in [place].  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  He wakes up with a dry mouth.  When he was on  AutoPap therapy he had good results overall.  He is not aware of any family history of sleep apnea.  He has experienced morning headaches and has nocturia about once or twice per average night.   ?I reviewed your office note from 05/17/2021.  His Epworth sleepiness score is 14 out of 24 fatigue severity score is 23 out of 63. ?  ? ?His Past Medical History Is Significant For: ?Past Medical History:  ?Diagnosis Date  ? Allergic rhinitis   ? Allergy   ? Chronic sinusitis   ? Diabetes mellitus ([place])   ? Hyperlipidemia   ? Hypertension   ? Hypogonadism male   ? Perinephric hematoma 07/16/2015  ? ? ?His Past Surgical History Is Significant For: ?Past Surgical History:  ?Procedure Laterality Date  ? COLONOSCOPY    ? HERNIA REPAIR    ? ROTATOR CUFF REPAIR Left 2017 sept 5  ? WISDOM TOOTH EXTRACTION    ? ? ?His Family History Is Significant For: ?Family History  ?Problem Relation Age of Onset  ? Diabetes Mother   ? Hypertension Mother   ? Hyperlipidemia Mother   ? Myelodysplastic syndrome Mother   ? Sleep apnea Mother   ? Diabetes Father   ? Hypertension Father   ? Hyperlipidemia Father   ? Cancer Father 86  ?     kidney  ? Diabetes Brother   ? Hypertension Brother   ? Diabetes Mellitus [name] Paternal Grandfather   ? Colon cancer Neg Hx   ? Esophageal cancer Neg Hx   ? Rectal cancer Neg Hx   ? Stomach cancer Neg Hx   ? Liver cancer Neg Hx   ? Pancreatic cancer Neg Hx   ? Prostate cancer Neg Hx   ? ? ?His Social History Is Significant For: ?Social History  ? ?Socioeconomic History  ? Marital status: Divorced  ?  Spouse name: Not on file  ? Number of children: Not on file  ? Years of education: Not on file  ? Highest education level: Not on file  ?Occupational History  ? Not on file  ?Tobacco Use  ? Smoking status: Former  ?  Packs/day: 0.30  ?  Years: 8.00  ?  Pack years: 2.40  ?  Types: Cigarettes  ?  Quit date: 12/10/2000  ?  Years since quitting: 21.3  ? Smokeless tobacco: Never  ?Vaping Use  ? Vaping Use: Never used  ?Substance  and Sexual Activity  ? Alcohol use: Yes  ?  Comment: occ  ? Drug use: No  ? Sexual activity: Yes  ?  Partners: Female  ?  Birth control/protection: None  ?Other Topics Concern  ? Not on file  ?Social History Narrative  ? Not on file  ? ?Social Determinants of Health  ? ?Financial Resource Strain: Not on file  ?Food Insecurity: Not on  file  ?Transportation Needs: Not on file  ?Physical Activity: Not on file  ?Stress: Not on file  ?Social Connections: Not on file  ? ? ?His Allergies Are:  ?Allergies  ?Allergen Reactions  ? Other   ?  Ppd Black Rubber Mix  ? Ppd [Tuberculin Purified Protein Derivative] Other (See Comments)  ?  POSITIVE REACTION IN PAST 11/21/11 CXR NEG  ?:  ? ?His Current Medications Are:  ?Outpatient Encounter Medications as of 04/16/2022  ?Medication Sig  ? AMBULATORY NON FORMULARY MEDICATION Diltiazem gel with 2% lidocaine  ?Apply a pea sized amount into your rectum three times daily ?Dispense 30 GM zero refill  ? AMBULATORY NON FORMULARY MEDICATION Medication Name: Diltiazem with 2% Lidocaine compound ? ?Apply to rectum 3 times daily as needed  ? amLODipine (NORVASC) 10 MG tablet TAKE 1 TABLET DAILY FOR BLOOD PRESSURE  ? atorvastatin (LIPITOR) 20 MG tablet TAKE 1 TABLET BY MOUTH EVERY DAY FOR CHOLESTEROL  ? bisoprolol (ZEBETA) 10 MG tablet Take 1  tablet  Daily  for BP / Patient knows to take by mouth  ? ciclesonide (OMNARIS) 50 MCG/ACT nasal spray Place 2 sprays into both nostrils daily as needed for allergies.   ? clotrimazole-betamethasone (LOTRISONE) cream APPLY 3 TIMES A DAY AS NEEDED  ? Continuous Blood Gluc Sensor (FREESTYLE LIBRE 14 DAY SENSOR) MISC Apply 1 sensor once every 2 weeks DX Z79.4 insulin dependent diabetes  ? Continuous Blood Gluc Sensor MISC 1 each by Does not apply route as directed. Use as directed every 10 days. May dispense FreeStyle [employer] or similar.  ? ezetimibe (ZETIA) 10 MG tablet TAKE 1 TABLET BY DAILY FOR CHOLESTEROL  ? fluconazole (DIFLUCAN) 150 MG tablet  Take one tablet as needed for yeast.  ? indapamide (LOZOL) 1.25 MG tablet Take 1.25 mg by mouth daily.  ? insulin NPH-regular Human (NOVOLIN 70/30) (70-30) 100 UNIT/ML injection INJECT UP TO 50 UNITS 2 X /DAY

## 2022-04-16 NOTE — Patient Instructions (Signed)
It was nice to see you again today.  You are compliant with your AutoPap therapy and have adjusted well thankfully, keep up the good work! ?We can see you in 1 year, you can see one of our nurse practitioners as you are stable.  ?Please continue using your autoPAP regularly. While your insurance requires that you use PAP at least 4 hours each night on 70% of the nights, I recommend, that you not skip any nights and use it throughout the night if you can. Getting used to PAP and staying with the treatment long term does take time and patience and discipline. Untreated obstructive sleep apnea when it is moderate to severe can have an adverse impact on cardiovascular health and raise her risk for heart disease, arrhythmias, hypertension, congestive heart failure, stroke and diabetes. Untreated obstructive sleep apnea causes sleep disruption, nonrestorative sleep, and sleep deprivation. This can have an impact on your day to day functioning and cause daytime sleepiness and impairment of cognitive function, memory loss, mood disturbance, and problems focussing. Using PAP regularly can improve these symptoms. ? ? ? ?

## 2022-04-17 ENCOUNTER — Ambulatory Visit: Payer: BC Managed Care – PPO | Admitting: Internal Medicine

## 2022-04-17 ENCOUNTER — Encounter: Payer: Self-pay | Admitting: Internal Medicine

## 2022-04-17 VITALS — BP 110/62 | HR 72 | Temp 97.5°F | Resp 16 | Wt 252.0 lb

## 2022-04-17 DIAGNOSIS — E1122 Type 2 diabetes mellitus with diabetic chronic kidney disease: Secondary | ICD-10-CM

## 2022-04-17 DIAGNOSIS — E559 Vitamin D deficiency, unspecified: Secondary | ICD-10-CM | POA: Diagnosis not present

## 2022-04-17 DIAGNOSIS — I10 Essential (primary) hypertension: Secondary | ICD-10-CM | POA: Diagnosis not present

## 2022-04-17 DIAGNOSIS — Z6836 Body mass index (BMI) 36.0-36.9, adult: Secondary | ICD-10-CM

## 2022-04-17 DIAGNOSIS — E1169 Type 2 diabetes mellitus with other specified complication: Secondary | ICD-10-CM

## 2022-04-17 DIAGNOSIS — E785 Hyperlipidemia, unspecified: Secondary | ICD-10-CM | POA: Diagnosis not present

## 2022-04-17 DIAGNOSIS — Z79899 Other long term (current) drug therapy: Secondary | ICD-10-CM

## 2022-04-17 DIAGNOSIS — G4733 Obstructive sleep apnea (adult) (pediatric): Secondary | ICD-10-CM | POA: Diagnosis not present

## 2022-04-17 DIAGNOSIS — N182 Chronic kidney disease, stage 2 (mild): Secondary | ICD-10-CM

## 2022-04-17 MED ORDER — TIRZEPATIDE 2.5 MG/0.5ML ~~LOC~~ SOAJ: 2.5000 mg | SUBCUTANEOUS | 0 refills | Status: DC

## 2022-04-17 NOTE — Patient Instructions (Signed)

## 2022-04-18 ENCOUNTER — Ambulatory Visit
Admission: RE | Admit: 2022-04-18 | Discharge: 2022-04-18 | Disposition: A | Discharge status: Home / Self Care | Payer: BC Managed Care – PPO | Source: Ambulatory Visit | Attending: Urology | Admitting: Urology

## 2022-04-18 ENCOUNTER — Encounter: Payer: Self-pay | Admitting: Internal Medicine

## 2022-04-18 DIAGNOSIS — D4101 Neoplasm of uncertain behavior of right kidney: Secondary | ICD-10-CM

## 2022-04-18 DIAGNOSIS — Z87828 Personal history of other (healed) physical injury and trauma: Secondary | ICD-10-CM | POA: Diagnosis not present

## 2022-04-18 DIAGNOSIS — N2889 Other specified disorders of kidney and ureter: Secondary | ICD-10-CM | POA: Diagnosis not present

## 2022-04-18 LAB — HEMOGLOBIN A1C
Hgb A1c MFr Bld: 13.9 % of total Hgb — ABNORMAL HIGH (ref <5.7)
Mean Plasma Glucose: 352 mg/dL
eAG (mmol/L): 19.5 mmol/L

## 2022-04-18 LAB — VITAMIN D 25 HYDROXY (VIT D DEFICIENCY, FRACTURES): Vit D, 25-Hydroxy: 55 ng/mL (ref 30–100)

## 2022-04-18 LAB — COMPLETE METABOLIC PANEL WITH GFR
AG Ratio: 1.4 (calc) (ref 1.0–2.5)
ALT: 11 U/L (ref 9–46)
AST: 11 U/L (ref 10–35)
Albumin: 4.3 g/dL (ref 3.6–5.1)
Alkaline phosphatase (APISO): 76 U/L (ref 35–144)
BUN: 22 mg/dL (ref 7–25)
CO2: 26 mmol/L (ref 20–32)
Calcium: 9.8 mg/dL (ref 8.6–10.3)
Chloride: 98 mmol/L (ref 98–110)
Creat: 1.17 mg/dL (ref 0.70–1.30)
Globulin: 3 g/dL (calc) (ref 1.9–3.7)
Glucose, Bld: 331 mg/dL — ABNORMAL HIGH (ref 65–99)
Potassium: 4.4 mmol/L (ref 3.5–5.3)
Sodium: 135 mmol/L (ref 135–146)
Total Bilirubin: 0.7 mg/dL (ref 0.2–1.2)
Total Protein: 7.3 g/dL (ref 6.1–8.1)
eGFR: 72 mL/min/{1.73_m2} (ref >=60)

## 2022-04-18 LAB — MAGNESIUM: Magnesium: 2 mg/dL (ref 1.5–2.5)

## 2022-04-18 LAB — LIPID PANEL
Cholesterol: 141 mg/dL (ref <200)
HDL: 30 mg/dL — ABNORMAL LOW (ref >=40)
LDL Cholesterol (Calc): 87 mg/dL (calc)
Non-HDL Cholesterol (Calc): 111 mg/dL (calc) (ref <130)
Total CHOL/HDL Ratio: 4.7 (calc) (ref <5.0)
Triglycerides: 137 mg/dL (ref <150)

## 2022-04-18 LAB — CBC WITH DIFFERENTIAL/PLATELET
Absolute Monocytes: 725 cells/uL (ref 200–950)
Basophils Absolute: 29 cells/uL (ref 0–200)
Basophils Relative: 0.5 %
Eosinophils Absolute: 220 cells/uL (ref 15–500)
Eosinophils Relative: 3.8 %
HCT: 45 % (ref 38.5–50.0)
Hemoglobin: 14.5 g/dL (ref 13.2–17.1)
Lymphs Abs: 1908 cells/uL (ref 850–3900)
MCH: 26.8 pg — ABNORMAL LOW (ref 27.0–33.0)
MCHC: 32.2 g/dL (ref 32.0–36.0)
MCV: 83 fL (ref 80.0–100.0)
MPV: 10.2 fL (ref 7.5–12.5)
Monocytes Relative: 12.5 %
Neutro Abs: 2917 cells/uL (ref 1500–7800)
Neutrophils Relative %: 50.3 %
Platelets: 300 10*3/uL (ref 140–400)
RBC: 5.42 10*6/uL (ref 4.20–5.80)
RDW: 13 % (ref 11.0–15.0)
Total Lymphocyte: 32.9 %
WBC: 5.8 10*3/uL (ref 3.8–10.8)

## 2022-04-18 LAB — TSH: TSH: 0.84 mIU/L (ref 0.40–4.50)

## 2022-04-18 MED ORDER — GADOBENATE DIMEGLUMINE 529 MG/ML IV SOLN
15.0000 mL | Freq: Once | INTRAVENOUS | Status: AC | PRN
  Administered 2022-04-18: 15 mL via INTRAVENOUS

## 2022-04-18 NOTE — Progress Notes (Signed)
<><><><><><><><><><><><><><><><><><><><><><><><><><><><><><><><><> ?<><><><><><><><><><><><><><><><><><><><><><><><><><><><><><><><><> ? ?-    Glucose = 331 -- Way too   High  ! !      and      A1c = 13.9 % - Horrible  ! !  ?                                        - Sitting on a Time Bomb ! ? ?- You're Lucky you've not ended up in hospital   (yet )  ? ?vvvvvvvvvvvvvvvvvvvvvvvvvvvvvvvvvvvvvvvvvvvvvvvvvvvvvvvvvvvvvvvvvvvvvvvvvvv ?vvvvvvvvvvvvvvvvvvvvvvvvvvvvvvvvvvvvvvvvvvvvvvvvvvvvvvvvvvvvvvvvvvvvvvvvvvv ? ?- Be sure taking all #4 Metformin &  monitoring your blood sugar  2 x /day with  ?                            your Insulin & adjusting Insulin doses when glucoses are elevated  ? ?- Also start your new Rx for Mounjaro  ?<><><><><><><><><><><><><><><><><><><><><><><><><><><><><><><><><> ?<><><><><><><><><><><><><><><><><><><><><><><><><><><><><><><><><> ? ?-  Please call office in about 10 days to report in your sugars.  ? ?<><><><><><><><><><><><><><><><><><><><><><><><><><><><><><><><><> ? ?-  Vitamin D = 55 - Low  ? ?- Vitamin D goal is between 70-100.  ? ?- Please let me know how much Vitamin D that yu are taking . ? ?- It is very important as a natural anti-inflammatory and helping the  ?immune system protect against viral infections, like the Covid-19  ? ?- helping hair, skin, and nails, as well as reducing stroke and heart attack risk.  ? ?- It helps your bones and helps with mood. ? ?- It also decreases numerous cancer risks so please  ?take it as directed.  ? ?- Low Vit D is associated with a 200-300% higher risk for CANCER  ? ?and 200-300% higher risk for HEART   ATTACK  &  STROKE.   ? ?- It is also associated with higher death rate at younger ages,  ? ?autoimmune diseases like Rheumatoid arthritis, Lupus, Multiple Sclerosis.    ? ?- Also many other serious conditions, like depression, Alzheimer's ? ?Dementia, infertility, muscle aches, fatigue, fibromyalgia  ? ?- just to name a  few. ? ?<>0<>0<>0<>0<>0<>0<>0<>0<>0<>0<>0<>0<>0<>0<>0<>0<>0<>0<>0<>0<>0<>0<>0 ? ? ? ? ? ? ? ? ?

## 2022-04-19 ENCOUNTER — Other Ambulatory Visit: Payer: Self-pay | Admitting: Physician Assistant

## 2022-04-20 ENCOUNTER — Other Ambulatory Visit: Payer: Self-pay

## 2022-04-20 ENCOUNTER — Other Ambulatory Visit: Payer: Self-pay | Admitting: Internal Medicine

## 2022-04-20 DIAGNOSIS — Z794 Long term (current) use of insulin: Secondary | ICD-10-CM

## 2022-04-20 MED ORDER — FREESTYLE LIBRE 2 SENSOR MISC: 3 refills | Status: DC

## 2022-04-20 MED ORDER — TRULICITY 1.5 MG/0.5ML ~~LOC~~ SOAJ: 1.5000 mg | SUBCUTANEOUS | 0 refills | Status: DC

## 2022-04-20 MED ORDER — FREESTYLE LIBRE 14 DAY SENSOR MISC: 99 refills | Status: DC

## 2022-05-15 DIAGNOSIS — R1084 Generalized abdominal pain: Secondary | ICD-10-CM | POA: Diagnosis not present

## 2022-05-15 DIAGNOSIS — N200 Calculus of kidney: Secondary | ICD-10-CM | POA: Diagnosis not present

## 2022-05-17 ENCOUNTER — Encounter: Payer: Self-pay | Admitting: Adult Health

## 2022-05-17 ENCOUNTER — Ambulatory Visit (INDEPENDENT_AMBULATORY_CARE_PROVIDER_SITE_OTHER): Payer: BC Managed Care – PPO | Admitting: Adult Health

## 2022-05-17 VITALS — BP 112/70 | HR 68 | Temp 97.7°F | Ht 70.0 in | Wt 251.0 lb

## 2022-05-17 DIAGNOSIS — R350 Frequency of micturition: Secondary | ICD-10-CM

## 2022-05-17 DIAGNOSIS — Z86010 Personal history of colonic polyps: Secondary | ICD-10-CM

## 2022-05-17 DIAGNOSIS — G4733 Obstructive sleep apnea (adult) (pediatric): Secondary | ICD-10-CM

## 2022-05-17 DIAGNOSIS — E349 Endocrine disorder, unspecified: Secondary | ICD-10-CM

## 2022-05-17 DIAGNOSIS — Z1389 Encounter for screening for other disorder: Secondary | ICD-10-CM

## 2022-05-17 DIAGNOSIS — N401 Enlarged prostate with lower urinary tract symptoms: Secondary | ICD-10-CM | POA: Diagnosis not present

## 2022-05-17 DIAGNOSIS — E559 Vitamin D deficiency, unspecified: Secondary | ICD-10-CM | POA: Diagnosis not present

## 2022-05-17 DIAGNOSIS — I10 Essential (primary) hypertension: Secondary | ICD-10-CM | POA: Diagnosis not present

## 2022-05-17 DIAGNOSIS — Z125 Encounter for screening for malignant neoplasm of prostate: Secondary | ICD-10-CM | POA: Diagnosis not present

## 2022-05-17 DIAGNOSIS — Z136 Encounter for screening for cardiovascular disorders: Secondary | ICD-10-CM | POA: Diagnosis not present

## 2022-05-17 DIAGNOSIS — Z13220 Encounter for screening for lipoid disorders: Secondary | ICD-10-CM

## 2022-05-17 DIAGNOSIS — E1159 Type 2 diabetes mellitus with other circulatory complications: Secondary | ICD-10-CM

## 2022-05-17 DIAGNOSIS — Z0000 Encounter for general adult medical examination without abnormal findings: Secondary | ICD-10-CM | POA: Diagnosis not present

## 2022-05-17 DIAGNOSIS — Z1329 Encounter for screening for other suspected endocrine disorder: Secondary | ICD-10-CM | POA: Diagnosis not present

## 2022-05-17 DIAGNOSIS — Z79899 Other long term (current) drug therapy: Secondary | ICD-10-CM | POA: Diagnosis not present

## 2022-05-17 DIAGNOSIS — E1122 Type 2 diabetes mellitus with diabetic chronic kidney disease: Secondary | ICD-10-CM

## 2022-05-17 DIAGNOSIS — I700 Atherosclerosis of aorta: Secondary | ICD-10-CM

## 2022-05-17 DIAGNOSIS — E790 Hyperuricemia without signs of inflammatory arthritis and tophaceous disease: Secondary | ICD-10-CM

## 2022-05-17 DIAGNOSIS — R300 Dysuria: Secondary | ICD-10-CM

## 2022-05-17 DIAGNOSIS — E1169 Type 2 diabetes mellitus with other specified complication: Secondary | ICD-10-CM

## 2022-05-17 DIAGNOSIS — Z0001 Encounter for general adult medical examination with abnormal findings: Secondary | ICD-10-CM

## 2022-05-17 DIAGNOSIS — Z91199 Patient's noncompliance with other medical treatment and regimen due to unspecified reason: Secondary | ICD-10-CM

## 2022-05-17 DIAGNOSIS — Z794 Long term (current) use of insulin: Secondary | ICD-10-CM

## 2022-05-17 DIAGNOSIS — E1165 Type 2 diabetes mellitus with hyperglycemia: Secondary | ICD-10-CM

## 2022-05-17 MED ORDER — FLUCONAZOLE 150 MG PO TABS: ORAL_TABLET | ORAL | 0 refills | Status: DC

## 2022-05-17 MED ORDER — MOUNJARO 2.5 MG/0.5ML ~~LOC~~ SOAJ: 2.5000 mg | SUBCUTANEOUS | 0 refills | Status: DC

## 2022-05-17 NOTE — Patient Instructions (Signed)
Russell [name] , Thank you for taking time to come for your Annual Wellness Visit. I appreciate your ongoing commitment to your health goals. Please review the following plan we discussed and let me know if I can assist you in the future.   These are the goals we discussed:  Goals      HEMOGLOBIN A1C < 8.0     LDL CALC < 70     Weight (lb) < 230 lb (104.3 kg)        This is a list of the screening recommended for you and due dates:  Health Maintenance  Topic Date Due   Zoster (Shingles) Vaccine (1 of 2) Never done   Eye exam for diabetics  06/12/2019   COVID-19 Vaccine (4 - Booster for Moderna series) 06/02/2022*   Flu Shot  07/10/2022   Hemoglobin A1C  10/18/2022   Complete foot exam   05/18/2023   Tetanus Vaccine  04/08/2030   Colon Cancer Screening  03/12/2032   Hepatitis C Screening: USPSTF Recommendation to screen - Ages 18-79 yo.  Completed   HIV Screening  Completed   HPV Vaccine  Aged Out  *Topic was postponed. The date shown is not the original due date.      Know what a healthy weight is for you (roughly BMI <25) and aim to maintain this  Aim for 7+ servings of fruits and vegetables daily  65-80+ fluid ounces of water or unsweet tea for healthy kidneys  Limit to max 1 drink of alcohol per day; avoid smoking/tobacco  Limit animal fats in diet for cholesterol and heart health - choose grass fed whenever available  Avoid highly processed foods, and foods high in saturated/trans fats  Aim for low stress - take time to unwind and care for your mental health  Aim for 150 min of moderate intensity exercise weekly for heart health, and weights twice weekly for bone health  Aim for 7-9 hours of sleep daily      Tirzepatide Injection What is this medication? TIRZEPATIDE (tir ZEP a tide) treats type 2 diabetes. It works by increasing insulin levels in your body, which decreases your blood sugar (glucose). Changes to diet and exercise are often combined with this  medication. This medicine may be used for other purposes; ask your health care provider or pharmacist if you have questions. COMMON BRAND NAME(S): MOUNJARO What should I tell my care team before I take this medication? They need to know if you have any of these conditions: Endocrine tumors (MEN 2) or if someone in your family had these tumors Eye disease, vision problems Gallbladder disease History of pancreatitis Kidney disease Stomach or intestine problems Thyroid cancer or if someone in your family had thyroid cancer An unusual or allergic reaction to tirzepatide, other medications, foods, dyes, or preservatives Pregnant or trying to get pregnant Breast-feeding How should I use this medication? This medication is injected under the skin. You will be taught how to prepare and give it. It is given once every week (every 7 days). Keep taking it unless your health care provider tells you to stop. If you use this medication with insulin, you should inject this medication and the insulin separately. Do not mix them together. Do not give the injections right next to each other. Change (rotate) injection sites with each injection. This medication comes with INSTRUCTIONS FOR USE. Ask your pharmacist for directions on how to use this medication. Read the information carefully. Talk to your pharmacist or  care team if you have questions. It is important that you put your used needles and syringes in a special sharps container. Do not put them in a trash can. If you do not have a sharps container, call your pharmacist or care team to get one. A special MedGuide will be given to you by the pharmacist with each prescription and refill. Be sure to read this information carefully each time. Talk to your care team about the use of this medication in children. Special care may be needed. Overdosage: If you think you have taken too much of this medicine contact a poison control center or emergency room at  once. NOTE: This medicine is only for you. Do not share this medicine with others. What if I miss a dose? If you miss a dose, take it as soon as you can unless it is more than 4 days (96 hours) late. If it is more than 4 days late, skip the missed dose. Take the next dose at the normal time. Do not take 2 doses within 3 days of each other. What may interact with this medication? Alcohol Antiviral medications for HIV or AIDS Aspirin and aspirin-like medications Beta blockers, such as atenolol, metoprolol, propranolol Certain medications for blood pressure, heart disease, irregular heart beat Chromium Clonidine Diuretics Estrogen or progestin hormones Fenofibrate Gemfibrozil Guanethidine Isoniazid Lanreotide Male hormones or anabolic steroids MAOIs, such as Marplan, Nardil, and Parnate Medications for weight loss Medications for allergies, asthma, cold, or cough Medications for depression, anxiety, or mental health conditions Niacin Nicotine NSAIDs, medications for pain and inflammation, such as ibuprofen or naproxen Octreotide Other medications for diabetes, such as glyburide, glipizide, or glimepiride Pasireotide Pentamidine Phenytoin Probenecid Quinolone antibiotics, such as ciprofloxacin, levofloxacin, ofloxacin Reserpine Some herbal dietary supplements Steroid medications, such as prednisone or cortisone Sulfamethoxazole; trimethoprim Thyroid hormones Warfarin This list may not describe all possible interactions. Give your health care provider a list of all the medicines, herbs, non-prescription drugs, or dietary supplements you use. Also tell them if you smoke, drink alcohol, or use illegal drugs. Some items may interact with your medicine. What should I watch for while using this medication? Visit your care team for regular checks on your progress. Drink plenty of fluids while taking this medication. Check with your care team if you get an attack of severe diarrhea,  nausea, and vomiting. The loss of too much body fluid can make it dangerous for you to take this medication. A test called the HbA1C (A1C) will be monitored. This is a simple blood test. It measures your blood sugar control over the last 2 to 3 months. You will receive this test every 3 to 6 months. Learn how to check your blood sugar. Learn the symptoms of low and high blood sugar and how to manage them. Always carry a quick-source of sugar with you in case you have symptoms of low blood sugar. Examples include hard sugar candy or glucose tablets. Make sure others know that you can choke if you eat or drink when you develop serious symptoms of low blood sugar, such as seizures or unconsciousness. They must get medical help at once. Tell your care team if you have high blood sugar. You might need to change the dose of your medication. If you are sick or exercising more than usual, you might need to change the dose of your medication. Do not skip meals. Ask your care team if you should avoid alcohol. Many nonprescription cough and cold products contain sugar  or alcohol. These can affect blood sugar. Pens should never be shared. Even if the needle is changed, sharing may result in passing of viruses like hepatitis or HIV. Wear a medical ID bracelet or chain, and carry a card that describes your disease and details of your medication and dosage times. Birth control may not work properly while you are taking this medication. If you take birth control pills by mouth, your care team may recommend another type of birth control for 4 weeks after you start this medication and for 4 weeks after each increase in your dose of this medication. Ask your care team which birth control methods you should use. What side effects may I notice from receiving this medication? Side effects that you should report to your care team as soon as possible: Allergic reactions--skin rash, itching, hives, swelling of the face, lips,  tongue, or throat Change in vision Dehydration--increased thirst, dry mouth, feeling faint or lightheaded, headache, dark yellow or brown urine Gallbladder problems--severe stomach pain, nausea, vomiting, fever Kidney injury--decrease in the amount of urine, swelling of the ankles, hands, or feet Pancreatitis--severe stomach pain that spreads to your back or gets worse after eating or when touched, fever, nausea, vomiting Thyroid cancer--new mass or lump in the neck, pain or trouble swallowing, trouble breathing, hoarseness Side effects that usually do not require medical attention (report these to your care team if they continue or are bothersome): Constipation Diarrhea Loss of appetite Nausea Stomach pain Upset stomach Vomiting This list may not describe all possible side effects. Call your doctor for medical advice about side effects. You may report side effects to FDA at 1-800-FDA-1088. Where should I keep my medication? Keep out of the reach of children and pets. Refrigeration (preferred): Store unopened pens in a refrigerator between 2 and 8 degrees C (36 and 46 degrees F). Keep it in the original carton until you are ready to take it. Do not freeze or use if the medication has been frozen. Protect from light. Get rid of any unused medication after the expiration date on the label. Room Temperature: The pen may be stored at room temperature below 30 degrees C (86 degrees F) for up to a total of 21 days if needed. Protect from light. Avoid exposure to extreme heat. If it is stored at room temperature, throw away any unused medication after 21 days or after it expires, whichever is first. The pen has glass parts. Handle it carefully. If you drop the pen on a hard surface, do not use it. Use a new pen for your injection. To get rid of medications that are no longer needed or have expired: Take the medication to a medication take-back program. Check with your pharmacy or law enforcement to  find a location. If you cannot return the medication, ask your pharmacist or care team how to get rid of this medication safely. NOTE: This sheet is a summary. It may not cover all possible information. If you have questions about this medicine, talk to your doctor, pharmacist, or health care provider.  2023 [employer] Standard (2022-01-09 00:00:00)

## 2022-05-17 NOTE — Progress Notes (Signed)
Complete Physical  Assessment and Plan:   Encounter for Annual Physical Exam with abnormal findings Due annually  Health Maintenance reviewed Healthy lifestyle reviewed and goals set - check at pharmacy about shingrix - schedule diabetes eye exam   Atherosclerosis of aortic bifurcation and common iliac arteries ([place]) Per MRI 2021 Control blood pressure, cholesterol, glucose, increase exercise.   Essential hypertension - continue medications, DASH diet, exercise and monitor at home. Call if greater than 130/80.  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  - urology follows   Hyperlipidemia associated with type 2 diabetes mellitus ([place]) -     Lipid panel check lipids LDL goal <70 decrease fatty foods increase activity.   Uncontrolled type 2 diabetes mellitus with stage 2 chronic kidney disease, with long-term current use of insulin ([place]) Discussed general issues about diabetes pathophysiology and management., Educational material distributed., Suggested low cholesterol diet., Encouraged aerobic exercise., Discussed foot care., Reminded to get yearly retinal exam. - pending mounjaro, script resent, monitoring for PA, has ? Failed ozempic, trulicity - continue insulin for now, sliding scale - add EC bASA -     Hemoglobin A1c  CKD stage 2 due to type 2 diabetes mellitus (HCC) -     Hemoglobin A1c -      CMP/GFR -      UA/microalbumin  Increase fluids, avoid NSAIDS, monitor sugars, will monitor  Morbid obesity (HCC) - BMI 30+ with OSA - follow up 3 months for progress monitoring - increase veggies, decrease carbs - long discussion about weight loss, diet, and exercise - pending  mounjaro- plan to reduce insulin  Obstructive sleep apnea-questioned Sleep apnea- continue autoCPAP, neuro following, weight loss advised  Poor compliance with diet Motivated, improved with continuous glucose monitoring Discuss at each visit  Medication management -     Magnesium  [name] -     VITAMIN D 25 Hydroxy (Vit-D Deficiency, Fractures)   Testosterone deficiency -     Testosterone  Nonallergic rhinitis MONITOR  Screening prostate cancer        -     PSA  Recurrent kidney stones/uric acid On flomax with benefit; [facility] following Check Uric acid levels  History of adenomatous polyp of colon Due for follow up 03/2032; high fiber/low red/processed meats discussed   Orders Placed This Encounter  Procedures   CBC with Differential/Platelet   COMPLETE METABOLIC PANEL WITH GFR   Magnesium   Lipid panel   TSH   VITAMIN D 25 Hydroxy (Vit-D Deficiency, Fractures)   Uric acid   PSA   Microalbumin / creatinine urine ratio   Urinalysis, Routine w reflex microscopic   Testosterone   Fructosamine   EKG 12-Lead   HM DIABETES FOOT EXAM    Discussed med's effects and SE's. Screening labs and tests as requested with regular follow-up as recommended. Over 40 minutes of exam, counseling, chart review and critical decision making was performed  Future Appointments  Date Time Provider [place]  08/29/2022  9:30 AM Darrol Jump, NP [facility] None  04/22/2023  7:45 AM Frann Rider, NP [facility] None  05/20/2023 10:00 AM Darrol Jump, NP [facility]  None     HPI 61 y.o. male patient presents for a complete physical. He has Nonallergic rhinitis; Obstructive sleep apnea-questioned; Hypertension; Hyperlipidemia associated with type 2 diabetes mellitus ([place]); Poorly controlled type 2 diabetes mellitus ([place]); [name]; Testosterone deficiency; Morbid obesity ([place]); Poor compliance with diet; Medication management; Diabetes mellitus  with circulatory disorder causing erectile dysfunction (HCC); CKD stage 2 due to type 2 diabetes mellitus ([place]); Atherosclerosis of Aorto-iliac arteries ([place]) By Abb MRI 2021; History of adenomatous polyp of colon; Insulin long-term use ([place]); Kidney stones; and Elevated blood uric acid level on their problem list.   He is married, 3 children, 4 grandchildren.  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   Severe OSA per sleep study in 2014, machine was recalled, underwent repeat study in 01/2022 with Dr. Rexene Alberts which found moderate OSA, now on autoPAP, reports 100% compliance, reports restorative sleep.   BMI is Body mass index is 36.01 kg/m., he has been working on diet and exercise, vegetarian during the week, meat only on weekends Wt Readings from Last 3 Encounters:  05/17/22 251 lb (113.9 kg)  04/17/22 252 lb (114.3 kg)  04/16/22 251 lb (113.9 kg)   He follows with Dr. Clover Mealy for perirenal hematoma annually, had benign follow up MRI AB 04/2022 for monitoring that showed atherosclerosis of the aorta/illiac disease.   His blood pressure has been controlled at home, today their BP is BP: 112/70 He does not workout but tried to get 5000 steps a day.   He denies chest pain, shortness of breath, dizziness.   He has been working on diet and exercise for Diabetes  with diabetic chronic kidney disease he is on ACE/ARB Hyperlipidemia -  on zetia and lipitor  he is on bASA every other day due to history of retroperitoneal hematoma   he is on 70/30 insulin he is taking variable, 20-30 units in am depending on glucose reading and 20-25 units with evening meal.  metformin 535m, two tablets twice a day Pending mounjaro  he has continuous glucose monitor which is helping him to better control his glucose.   denies paresthesia of the feet, polydipsia, polyuria and visual disturbances.  Last A1C was:  Lab Results  Component Value Date    HGBA1C 13.9 (H) 04/17/2022   Follows with Dr. GMoshe Ciprodue to hx of periphephretic hematoma following stone. Last GFR: Lab Results  Component Value Date   EGFR 72 04/17/2022   Lab Results  Component Value Date   CHOL 141 04/17/2022   HDL 30 (L) 04/17/2022   LDLCALC 87 04/17/2022   TRIG 137 04/17/2022   CHOLHDL 4.7 04/17/2022   Patient is on Vitamin D supplement.   Lab Results  Component Value Date   VD25OH 55 04/17/2022     Denies LUTs. Does take flomax daily due to frequent kidney stones, follows with [facility] Dr. WJeffie Pollock Last PSA was: Lab Results  Component Value Date   PSA 1.90 05/17/2021   He has a history of testosterone deficiency and is on testosterone replacement. He is prescribed 400 mg q2w, but hasn't been keeping up with this, last injection was ?.Marland KitchenHe states that the testosterone helps with his energy. Lab Results  Component Value Date   TESTOSTERONE 205 (L) 05/17/2021      Current Medications:    Current Outpatient Medications (Endocrine & Metabolic):    insulin NPH-regular Human (NOVOLIN 70/30) (70-30) 100 UNIT/ML injection,  INJECT UP TO 50 UNITS 2 X /DAY WITH MEALS FOR DIABETES   metFORMIN (GLUCOPHAGE-XR) 500 MG 24 hr tablet, TAKE 2 TABLETS TWICE DAILY WITH MEALS FOR DIABETES (DX - E11.9)   tirzepatide (MOUNJARO) 2.5 MG/0.5ML Pen, Inject 2.5 mg into the skin once a week.  Current Outpatient Medications (Cardiovascular):    amLODipine (NORVASC) 10 MG tablet, TAKE 1 TABLET DAILY FOR BLOOD PRESSURE   atorvastatin (LIPITOR) 20 MG tablet, TAKE 1 TABLET BY MOUTH EVERY DAY FOR CHOLESTEROL   bisoprolol (ZEBETA) 10 MG tablet, Take 1  tablet  Daily  for BP / Patient knows to take by mouth   ezetimibe (ZETIA) 10 MG tablet, TAKE 1 TABLET BY DAILY FOR CHOLESTEROL   indapamide (LOZOL) 1.25 MG tablet, Take 1.25 mg by mouth daily.   telmisartan (MICARDIS) 40 MG tablet, TAKE 1 TABLET DAILY FOR BLOOD PRESSURE & DIABETIC KIDNEY PROTECTION  Current Outpatient  Medications (Respiratory):    ciclesonide (OMNARIS) 50 MCG/ACT nasal spray, Place 2 sprays into both nostrils daily as needed for allergies.     Current Outpatient Medications (Other):    AMBULATORY NON FORMULARY MEDICATION, Diltiazem gel with 2% lidocaine  Apply a pea sized amount into your rectum three times daily Dispense 30 GM zero refill   AMBULATORY NON FORMULARY MEDICATION, Medication Name: Diltiazem with 2% Lidocaine compound  Apply to rectum 3 times daily as needed   clotrimazole-betamethasone (LOTRISONE) cream, APPLY 3 TIMES A DAY AS NEEDED   Continuous Blood Gluc Sensor (FREESTYLE LIBRE 2 SENSOR) MISC, Use to check blood sugar daily   Continuous Blood Gluc Sensor MISC, 1 each by Does not apply route as directed. Use as directed every 10 days. May dispense FreeStyle [employer] or similar.   glucose blood (FREESTYLE LITE) test strip, Check blood sugar 3 times a day or as directed. DX-E11.22   Insulin Syringe-Needle U-100 (INSULIN SYRINGE .5CC/31GX5/16") 31G X 5/16" 0.5 ML MISC, USE TWICE DAILY FOR INSULIN INJECTIONS   ketoconazole (NIZORAL) 2 % shampoo, APPLY 1 APPLICATION TOPICALLY 2 (TWO) TIMES A WEEK. APPLY & SHAMPOO 2 X /WEEK   methylcellulose (CITRUCEL) oral powder, Take 1 packet by mouth daily.   senna-docusate (SENOKOT-S) 8.6-50 MG tablet, Take 1 tablet by mouth every other day.   tamsulosin (FLOMAX) 0.4 MG CAPS capsule, Take 0.4 mg by mouth daily.   fluconazole (DIFLUCAN) 150 MG tablet, Take one tablet as needed for yeast.   Allergies:  Allergies  Allergen Reactions   Other     Ppd Black Rubber Mix   Ppd [Tuberculin Purified Protein Derivative] Other (See Comments)    POSITIVE REACTION IN PAST 11/21/11 CXR NEG   Health Maintenance:  Immunization History  Administered Date(s) Administered   Influenza Split 10/10/2013   Influenza Whole 09/08/2012   Influenza-Unspecified 09/22/2018, 09/11/2019, 09/18/2019   Moderna Covid-19 Vaccine Bivalent Booster 64yrs & up  10/02/2021   Moderna SARS-COV2 Booster Vaccination 05/26/2021   Moderna Sars-Covid-2 Vaccination 02/01/2020, 03/01/2020   Pneumococcal-Unspecified 12/31/2011   Tdap 12/19/2009, 04/08/2020    Tetanus: 2021 Shingrix: discussed, can get at pharmacy   Colonoscopy: 03/2022, hx of polyps but was clear, Dr. Havery Moros, 10 year recall  EGD:  Eye Exam: Dr. 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   Surgical History:  He  has a past surgical history that includes Wisdom tooth extraction; Hernia repair; Colonoscopy; and Rotator cuff repair (Left, 2017 sept 5).   Family History:  His family history includes Cancer (age of onset: 36) in his father; Diabetes in his brother, father, and mother; Diabetes Mellitus II in his paternal grandfather; Hyperlipidemia in his father and mother; Hypertension in his brother, father, and mother; Myelodysplastic syndrome in his mother; Sleep apnea in his mother.   Social History:   reports that he quit smoking about 21 years ago. His smoking use included cigarettes. He has a 2.40 pack-year smoking history. He has never used smokeless tobacco. He reports current alcohol use. He  reports that he does not use drugs.   Review of Systems:  Review of Systems  Constitutional: Negative.  Negative for malaise/fatigue and weight loss.  HENT: Negative.  Negative for hearing loss and tinnitus.   Eyes: Negative.  Negative for blurred vision and double vision.  Respiratory: Negative.  Negative for cough, shortness of breath and wheezing.   Cardiovascular: Negative.  Negative for chest pain, palpitations, orthopnea, claudication and leg swelling.  Gastrointestinal: Negative.  Negative for abdominal pain, blood in stool, constipation, diarrhea, heartburn, melena, nausea and vomiting.  Genitourinary: Negative.   Musculoskeletal: Negative.  Negative for joint pain and myalgias.  Skin: Negative.  Negative for rash.  Neurological: Negative.  Negative for dizziness, tingling, sensory change, weakness and headaches.  Endo/Heme/Allergies: Negative.  Negative for polydipsia.  Psychiatric/Behavioral: Negative.    All other systems reviewed and are negative.   Physical Exam: Estimated body mass index is 36.01 kg/m as calculated from the following:   Height as of this encounter: $RemoveBeforeD'5\' 10"'wenkXseLODrJWf$  (1.778 m).   Weight as of this encounter: 251 lb (113.9 kg). BP 112/70   Pulse 68   Temp 97.7 F (36.5 C)   Ht $R'5\' 10"'OU$  (1.778 m)   Wt 251 lb (113.9 kg)   SpO2 98%   BMI 36.01 kg/m  General Appearance: Well nourished, well dressed obese AA adult male in no apparent distress.  Eyes: PERRLA, EOMs, conjunctiva no swelling or erythema Sinuses: No Frontal/maxillary tenderness  ENT/Mouth: Ext aud canals clear, normal light reflex with TMs without erythema, bulging. Good dentition. No erythema, swelling, or exudate on post pharynx. Tonsils not swollen or erythematous. Hearing normal.  Neck: Supple, thyroid normal. No bruits  Respiratory: Respiratory effort normal, BS equal bilaterally without rales, rhonchi, wheezing or stridor.  Cardio: RRR without murmurs, prominent S2, no rubs or gallops. Brisk  peripheral pulses without edema.  Chest: symmetric, with normal excursions and percussion.  Abdomen: Soft,obese abdomen, nontender, no guarding, rebound, hernias, masses, or organomegaly.  Lymphatics: Tender right upper anterior cervical chain with single palpable lymph node; otherwise without lymphadenopathy.  Genitourinary: declines, doing self exams, no concerns, urology follows Musculoskeletal: Full ROM all peripheral extremities,5/5 strength, and normal gait.  Skin: Warm, dry without rashes, lesions, ecchymosis. Neuro: Cranial nerves intact, reflexes equal bilaterally. Normal muscle tone, no cerebellar symptoms. Sensation intact to monofilament bil feet.  Psych: Awake and oriented X 3, normal affect, Insight and Judgment  appropriate.   EKG: Sinus brady, NSCPT  Izora Ribas, NP-C 12:18 PM [facility]

## 2022-05-18 ENCOUNTER — Other Ambulatory Visit: Payer: Self-pay | Admitting: Adult Health

## 2022-05-18 DIAGNOSIS — G4733 Obstructive sleep apnea (adult) (pediatric): Secondary | ICD-10-CM | POA: Diagnosis not present

## 2022-05-18 DIAGNOSIS — E1169 Type 2 diabetes mellitus with other specified complication: Secondary | ICD-10-CM

## 2022-05-18 MED ORDER — ATORVASTATIN CALCIUM 40 MG PO TABS: ORAL_TABLET | ORAL | 3 refills | Status: DC

## 2022-05-24 LAB — COMPLETE METABOLIC PANEL WITH GFR
AG Ratio: 1.4 (calc) (ref 1.0–2.5)
ALT: 12 U/L (ref 9–46)
AST: 13 U/L (ref 10–35)
Albumin: 4.5 g/dL (ref 3.6–5.1)
Alkaline phosphatase (APISO): 76 U/L (ref 35–144)
BUN: 22 mg/dL (ref 7–25)
CO2: 31 mmol/L (ref 20–32)
Calcium: 10.1 mg/dL (ref 8.6–10.3)
Chloride: 99 mmol/L (ref 98–110)
Creat: 1.23 mg/dL (ref 0.70–1.30)
Globulin: 3.3 g/dL (calc) (ref 1.9–3.7)
Glucose, Bld: 161 mg/dL — ABNORMAL HIGH (ref 65–99)
Potassium: 4.4 mmol/L (ref 3.5–5.3)
Sodium: 137 mmol/L (ref 135–146)
Total Bilirubin: 0.6 mg/dL (ref 0.2–1.2)
Total Protein: 7.8 g/dL (ref 6.1–8.1)
eGFR: 68 mL/min/{1.73_m2} (ref >=60)

## 2022-05-24 LAB — CBC WITH DIFFERENTIAL/PLATELET
Absolute Monocytes: 605 cells/uL (ref 200–950)
Basophils Absolute: 20 cells/uL (ref 0–200)
Basophils Relative: 0.3 %
Eosinophils Absolute: 170 cells/uL (ref 15–500)
Eosinophils Relative: 2.5 %
HCT: 45.2 % (ref 38.5–50.0)
Hemoglobin: 15.2 g/dL (ref 13.2–17.1)
Lymphs Abs: 2094 cells/uL (ref 850–3900)
MCH: 28.1 pg (ref 27.0–33.0)
MCHC: 33.6 g/dL (ref 32.0–36.0)
MCV: 83.7 fL (ref 80.0–100.0)
MPV: 9.7 fL (ref 7.5–12.5)
Monocytes Relative: 8.9 %
Neutro Abs: 3910 cells/uL (ref 1500–7800)
Neutrophils Relative %: 57.5 %
Platelets: 333 10*3/uL (ref 140–400)
RBC: 5.4 10*6/uL (ref 4.20–5.80)
RDW: 13.9 % (ref 11.0–15.0)
Total Lymphocyte: 30.8 %
WBC: 6.8 10*3/uL (ref 3.8–10.8)

## 2022-05-24 LAB — MICROALBUMIN / CREATININE URINE RATIO
Creatinine, Urine: 129 mg/dL (ref 20–320)
Microalb Creat Ratio: 9 mcg/mg creat (ref <30)
Microalb, Ur: 1.1 mg/dL

## 2022-05-24 LAB — URINALYSIS, ROUTINE W REFLEX MICROSCOPIC
Bilirubin Urine: NEGATIVE
Glucose, UA: NEGATIVE
Hgb urine dipstick: NEGATIVE
Ketones, ur: NEGATIVE
Leukocytes,Ua: NEGATIVE
Nitrite: NEGATIVE
Protein, ur: NEGATIVE
Specific Gravity, Urine: 1.018 (ref 1.001–1.035)
pH: 5.5 (ref 5.0–8.0)

## 2022-05-24 LAB — LIPID PANEL
Cholesterol: 135 mg/dL (ref <200)
HDL: 33 mg/dL — ABNORMAL LOW (ref >=40)
LDL Cholesterol (Calc): 80 mg/dL (calc)
Non-HDL Cholesterol (Calc): 102 mg/dL (calc) (ref <130)
Total CHOL/HDL Ratio: 4.1 (calc) (ref <5.0)
Triglycerides: 127 mg/dL (ref <150)

## 2022-05-24 LAB — TESTOSTERONE: Testosterone: 265 ng/dL (ref 250–827)

## 2022-05-24 LAB — TSH: TSH: 0.9 mIU/L (ref 0.40–4.50)

## 2022-05-24 LAB — VITAMIN D 25 HYDROXY (VIT D DEFICIENCY, FRACTURES): Vit D, 25-Hydroxy: 52 ng/mL (ref 30–100)

## 2022-05-24 LAB — FRUCTOSAMINE: Fructosamine: 382 umol/L — ABNORMAL HIGH (ref 205–285)

## 2022-05-24 LAB — PSA: PSA: 2.63 ng/mL (ref <=4.00)

## 2022-05-24 LAB — URIC ACID: Uric Acid, Serum: 7.7 mg/dL (ref 4.0–8.0)

## 2022-05-24 LAB — MAGNESIUM: Magnesium: 1.9 mg/dL (ref 1.5–2.5)

## 2022-06-03 ENCOUNTER — Other Ambulatory Visit: Payer: Self-pay | Admitting: Internal Medicine

## 2022-06-03 DIAGNOSIS — I10 Essential (primary) hypertension: Secondary | ICD-10-CM

## 2022-06-14 ENCOUNTER — Other Ambulatory Visit: Payer: Self-pay | Admitting: Adult Health

## 2022-06-14 DIAGNOSIS — E1165 Type 2 diabetes mellitus with hyperglycemia: Secondary | ICD-10-CM

## 2022-06-14 MED ORDER — MOUNJARO 5 MG/0.5ML ~~LOC~~ SOAJ: 5.0000 mg | SUBCUTANEOUS | 0 refills | Status: DC

## 2022-06-28 ENCOUNTER — Other Ambulatory Visit: Payer: Self-pay | Admitting: Internal Medicine

## 2022-06-28 DIAGNOSIS — E1122 Type 2 diabetes mellitus with diabetic chronic kidney disease: Secondary | ICD-10-CM

## 2022-07-02 MED ORDER — TIRZEPATIDE 7.5 MG/0.5ML ~~LOC~~ SOAJ
7.5000 mg | SUBCUTANEOUS | 0 refills | Status: DC
Start: 2022-07-02 — End: 2022-08-06

## 2022-08-06 ENCOUNTER — Other Ambulatory Visit: Payer: Self-pay | Admitting: Nurse Practitioner

## 2022-08-06 ENCOUNTER — Encounter: Payer: Self-pay | Admitting: Internal Medicine

## 2022-08-06 DIAGNOSIS — E1165 Type 2 diabetes mellitus with hyperglycemia: Secondary | ICD-10-CM

## 2022-08-06 MED ORDER — MOUNJARO 10 MG/0.5ML ~~LOC~~ SOAJ: 10.0000 mg | SUBCUTANEOUS | 3 refills | Status: DC

## 2022-08-29 ENCOUNTER — Other Ambulatory Visit: Payer: Self-pay | Admitting: Nurse Practitioner

## 2022-08-29 ENCOUNTER — Ambulatory Visit: Payer: BC Managed Care – PPO | Admitting: Nurse Practitioner

## 2022-08-29 ENCOUNTER — Encounter: Payer: Self-pay | Admitting: Nurse Practitioner

## 2022-08-29 VITALS — BP 114/70 | HR 81 | Temp 97.9°F | Ht 70.0 in | Wt 238.0 lb

## 2022-08-29 DIAGNOSIS — E1165 Type 2 diabetes mellitus with hyperglycemia: Secondary | ICD-10-CM | POA: Diagnosis not present

## 2022-08-29 DIAGNOSIS — E1122 Type 2 diabetes mellitus with diabetic chronic kidney disease: Secondary | ICD-10-CM | POA: Diagnosis not present

## 2022-08-29 DIAGNOSIS — K219 Gastro-esophageal reflux disease without esophagitis: Secondary | ICD-10-CM

## 2022-08-29 DIAGNOSIS — Z79899 Other long term (current) drug therapy: Secondary | ICD-10-CM

## 2022-08-29 DIAGNOSIS — E1159 Type 2 diabetes mellitus with other circulatory complications: Secondary | ICD-10-CM | POA: Diagnosis not present

## 2022-08-29 DIAGNOSIS — E559 Vitamin D deficiency, unspecified: Secondary | ICD-10-CM

## 2022-08-29 DIAGNOSIS — I10 Essential (primary) hypertension: Secondary | ICD-10-CM | POA: Diagnosis not present

## 2022-08-29 DIAGNOSIS — E1169 Type 2 diabetes mellitus with other specified complication: Secondary | ICD-10-CM

## 2022-08-29 DIAGNOSIS — M10371 Gout due to renal impairment, right ankle and foot: Secondary | ICD-10-CM | POA: Diagnosis not present

## 2022-08-29 DIAGNOSIS — N182 Chronic kidney disease, stage 2 (mild): Secondary | ICD-10-CM

## 2022-08-29 DIAGNOSIS — J302 Other seasonal allergic rhinitis: Secondary | ICD-10-CM

## 2022-08-29 DIAGNOSIS — E349 Endocrine disorder, unspecified: Secondary | ICD-10-CM

## 2022-08-29 DIAGNOSIS — I708 Atherosclerosis of other arteries: Secondary | ICD-10-CM

## 2022-08-29 DIAGNOSIS — I700 Atherosclerosis of aorta: Secondary | ICD-10-CM

## 2022-08-29 DIAGNOSIS — E785 Hyperlipidemia, unspecified: Secondary | ICD-10-CM

## 2022-08-29 DIAGNOSIS — Z91199 Patient's noncompliance with other medical treatment and regimen due to unspecified reason: Secondary | ICD-10-CM

## 2022-08-29 DIAGNOSIS — G4733 Obstructive sleep apnea (adult) (pediatric): Secondary | ICD-10-CM

## 2022-08-29 MED ORDER — PANTOPRAZOLE SODIUM 20 MG PO TBEC: 20.0000 mg | DELAYED_RELEASE_TABLET | Freq: Every day | ORAL | 0 refills | Status: DC

## 2022-08-29 MED ORDER — TESTOSTERONE CYPIONATE 200 MG/ML IM SOLN
200.0000 mg | Freq: Once | INTRAMUSCULAR | Status: AC
  Administered 2022-08-29: 200 mg via INTRAMUSCULAR

## 2022-08-29 NOTE — Addendum Note (Signed)
Addended by: Chancy Hurter on: 08/29/2022 11:38 AM   Modules accepted: Orders

## 2022-08-29 NOTE — Patient Instructions (Signed)
Gout  Gout is painful swelling of your joints. Gout is a type of arthritis. It is caused by having too much uric acid in your body. Uric acid is a chemical that is made when your body breaks down substances called purines. If your body has too much uric acid, sharp crystals can form and build up in your joints. This causes pain and swelling. Gout attacks can happen quickly and be very painful (acute gout). Over time, the attacks can affect more joints and happen more often (chronic gout). What are the causes? Gout is caused by too much uric acid in your blood. This can happen because: Your kidneys do not remove enough uric acid from your blood. Your body makes too much uric acid. You eat too many foods that are high in purines. These foods include organ meats, some seafood, and beer. Trauma or stress can bring on an attack. What increases the risk? Having a family history of gout. Being male and middle-aged. Being male and having gone through menopause. Having an organ transplant. Taking certain medicines. Having certain conditions, such as: Being very overweight (obese). Lead poisoning. Kidney disease. A skin condition called psoriasis. Other risks include: Losing weight too quickly. Not having enough water in the body (being dehydrated). Drinking alcohol, especially beer. Drinking beverages that are sweetened with a type of sugar called fructose. What are the signs or symptoms? An attack of acute gout often starts at night and usually happens in just one joint. The most common place is the big toe. Other joints that may be affected include joints of the feet, ankle, knee, fingers, wrist, or elbow. Symptoms may include: Very bad pain. Warmth. Swelling. Stiffness. Tenderness. The affected joint may be very painful to touch. Shiny, red, or purple skin. Chills and fever. Chronic gout may cause symptoms more often. More joints may be involved. You may also have white or yellow lumps  (tophi) on your hands or feet or in other areas near your joints. How is this treated? Treatment for an acute attack may include medicines for pain and swelling, such as: NSAIDs, such as ibuprofen. Steroids taken by mouth or injected into a joint. Colchicine. This can be given by mouth or through an IV tube. Treatment to prevent future attacks may include: Taking small doses of NSAIDs or colchicine daily. Using a medicine that reduces uric acid levels in your blood, such as allopurinol. Making changes to your diet. You may need to see a food expert (dietitian) about what to eat and drink to prevent gout. Follow these instructions at home: During a gout attack  If told, put ice on the painful area. To do this: Put ice in a plastic bag. Place a towel between your skin and the bag. Leave the ice on for 20 minutes, 2-3 times a day. Take off the ice if your skin turns bright red. This is very important. If you cannot feel pain, heat, or cold, you have a greater risk of damage to the area. Raise the painful joint above the level of your heart as often as you can. Rest the joint as much as possible. If the joint is in your leg, you may be given crutches. Follow instructions from your doctor about what you cannot eat or drink. Avoiding future gout attacks Eat a low-purine diet. Avoid foods and drinks such as: Liver. Kidney. Anchovies. Asparagus. Herring. Mushrooms. Mussels. Beer. Stay at a healthy weight. If you want to lose weight, talk with your doctor. Do not   lose weight too fast. Start or continue an exercise plan as told by your doctor. Eating and drinking Avoid drinks sweetened by fructose. Drink enough fluids to keep your pee (urine) pale yellow. If you drink alcohol: Limit how much you have to: 0-1 drink a day for women who are not pregnant. 0-2 drinks a day for men. Know how much alcohol is in a drink. In the [place] one drink equals one 12 oz bottle of beer (355 mL), one 5 oz  glass of wine (148 mL), or one 1 oz glass of hard liquor (44 mL). General instructions Take over-the-counter and prescription medicines only as told by your doctor. Ask your doctor if you should avoid driving or using machines while you are taking your medicine. Return to your normal activities when your doctor says that it is safe. Keep all follow-up visits. Where to find more information National Institutes of Health: www.niams.nih.gov Contact a doctor if: You have another gout attack. You still have symptoms of a gout attack after 10 days of treatment. You have problems (side effects) because of your medicines. You have chills or a fever. You have burning pain when you pee (urinate). You have pain in your lower back or belly. Get help right away if: You have very bad pain. Your pain cannot be controlled. You cannot pee. Summary Gout is painful swelling of the joints. The most common site of pain is the big toe, but it can affect other joints. Medicines and avoiding some foods can help to prevent and treat gout attacks. This information is not intended to replace advice given to you by your health care provider. Make sure you discuss any questions you have with your health care provider. Document Revised: 08/30/2021 Document Reviewed: 08/30/2021 Elsevier Patient Education  2023 [employer]  

## 2022-08-29 NOTE — Progress Notes (Signed)
3 MONTH FOLLOW UP  Assessment and Plan:  Atherosclerosis of aortic bifurcation and common iliac arteries ([employer]) Per MRI 2021 Implement lifestyle  modifications. Control blood pressure, cholesterol, glucose, increase exercise.    Essential hypertension Continue Amlodipine & Telmisartan & Indapamide Discussed DASH (Dietary Approaches to Stop Hypertension) DASH diet is lower in sodium than a typical American diet. Cut back on foods that are high in saturated fat, cholesterol, and trans fats. Eat more whole-grain foods, fish, poultry, and nuts Remain active and exercise as tolerated daily.  Monitor BP at home-Call if greater than 130/80.  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 Recommended diet heavy in fruits and veggies, omega 3's. Decrease consumption of animal meats, cheeses, and dairy products. Remain active and exercise as tolerated. Continue to monitor. Check lipids/TSH   Uncontrolled type 2 diabetes mellitus with stage 2 chronic kidney disease, with long-term current use of insulin (HCC) Continue Libra Review of AGP report for 05/2022 for [name] reveals average glucose of 145. Revuie of AGP report for 7/8/-7/21 for [name] reveals average glucose of 138 Continue Mounjaro 10 mg, Metformin, Insulin 70/30 PRN due to BG readings.  Discussed general issues about diabetes pathophysiology and management., Educational material distributed., Suggested low cholesterol diet., Encouraged aerobic  exercise., Discussed foot care., Reminded to get yearly retinal exam.  CKD stage 2 due to type 2 diabetes mellitus ([place]) Continue bASA, statin Discussed how what you eat and drink can aide in kidney protection. Stay well hydrated. Avoid high salt foods. Avoid NSAIDS. Keep BP and BG well controlled.   Take medications as prescribed. Remain active and exercise as tolerated daily. Maintain weight.  Continue to monitor. Check CMP/GFR/Microablumin/UA   Morbid obesity (HCC) Down 13 lb Discussed appropriate BMI Goal of losing 1 lb per month. Diet modification. Physical activity. Encouraged/praised to build confidence.   Poor compliance with diet Motivated, improved with continuous glucose monitoring Discuss at each visit   [name] Continue supplement Monitor levels  Testosterone deficiency Continue Testosterone injections Monitor levels  Obstructive sleep apnea-questioned Continue CPAP, weight loss  Continue to follow with Pulmonology  Medication Management All medications discussed and reviewed in full. All questions and concerns regarding medications addressed.    Gout flare Monitor uric acid levels  Check UA  GERD Start Pantoprazole. No suspected reflux complications ([place]/stricture). Lifestyle modification:  wt loss, avoid meals 2-3h before bedtime. Consider eliminating food triggers:  chocolate, caffeine, EtOH, acid/spicy food. Continue to follow with GI.  Seasonal allergies. Zyrtec OTC samples provided. Avoid triggers.    Orders Placed This Encounter  Procedures   CBC with Differential/Platelet   COMPLETE METABOLIC PANEL WITH GFR   Lipid panel   Hemoglobin A1c   VITAMIN D 25 Hydroxy (Vit-D Deficiency, Fractures)   Testosterone   Uric acid   Urinalysis, Routine w reflex microscopic   Microalbumin / creatinine urine ratio   Meds ordered this encounter  Medications   pantoprazole (PROTONIX) 20 MG tablet    Sig: Take 1 tablet (20  mg total) by mouth daily.    Dispense:  30 tablet    Refill:  0  Order Specific Question:   Supervising Provider    Answer:   Unk Pinto [phone]     Discussed med's effects and SE's. Screening labs and tests as requested with regular follow-up as recommended.  Over 20 minutes of exam, counseling, chart review and critical decision making was performed  Future Appointments  Date Time Provider [place]  04/22/2023  7:45 AM Frann Rider, NP [facility] None  05/20/2023 10:00 AM Darrol Jump, NP [facility] None     HPI 61 y.o. male patient presents for a complete physical. 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   He is married, 3 children, 4 grandchildren.   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   Severe OSA per sleep study in 2014, he has a new CPAP,   Has been noticing increase in GERD symptoms.  Has been on Mounjaro for 4  months.  Has taken Prevacid OTC and Zantac with some improvement.  His continuing to lose weight.  BMI is Body mass index is 34.15 kg/m., he has been working on diet and exercise.  He has lost 13 lb in the last 3 mo.  Has cut out "many" foods. Wt Readings from Last 3 Encounters:  08/29/22 238 lb (108 kg)  05/17/22 251 lb (113.9 kg)  04/17/22 252 lb (114.3 kg)   He follows with Dr.  Clover Mealy for perirenal hematoma, had MRI AB 02/2020 for monitoring that showed atherosclerosis of the aorta/illiac disease.   He is on bASA every other day due to history of retroperitoneal hematoma    His blood pressure has been controlled at home, today their BP is BP: 114/70  He does not workout but tried to get 5000 steps a day.  He denies chest pain, shortness of breath, dizziness.   He has been working on diet and exercise for Diabetes  He is now taking Mounjuaro, after trying Ozempic which failed.  He is on 70/30 insulin he is taking variable, 10-12 units in am depending on glucose reading.  He continues with Metformin 544m, two tablets twice a day  he has continuous glucose monitor LOrlene Plumwhich is helping him to better control his glucose.  Review of AGP report for 05/2022 for LOrlene Plumreveals average glucose of 145. Revuie of AGP report for 7/8/-7/21 for LOrlene Plumreveals average glucose of 138.  He has tried to improve eating at lunch time.    Denies any low sugars, states low is 140-150 and high is 170 and fasting glucose ranges around 160-170  denies paresthesia of the feet, polydipsia, polyuria and visual disturbances.  Last A1C was:  Lab Results  Component Value Date   HGBA1C 13.9 (H) 04/17/2022   Follows with Dr. GMoshe Ciprodue to hx of periphephretic hematoma following stone. Last GFR: Lab Results  Component Value Date   GFRAA 77 05/17/2021   Lab Results  Component Value Date   CHOL 135 05/17/2022   HDL 33 (L) 05/17/2022   LDLCALC 80 05/17/2022  TRIG 127 05/17/2022   CHOLHDL 4.1 05/17/2022   Patient is on Vitamin D supplement.   Lab Results  Component Value Date   VD25OH 39 05/17/2022    He has a history of testosterone deficiency and is on testosterone replacement. He is prescribed 400 mg q2w, but hasn't been keeping up with this, last injection was 02/23/2021. He is requesting to restart today. Marland KitchenHe states that the testosterone helps with his energy.  Lab  Results  Component Value Date   TESTOSTERONE 265 05/17/2022    He reports having kidneys stone that are full of uric acid.  Has noticed a recent gout flare in his right toe.  He has stopped eating "[place] [employer]."   Reports seasonal allergies with sneezing, watery eyes, nasal congestion.  Worse around noon and this time of year when seasons change  He does not take an OTC antihistamine. Current Medications:    Current Outpatient Medications (Endocrine & Metabolic):    insulin NPH-regular Human (NOVOLIN 70/30) (70-30) 100 UNIT/ML injection, INJECT UP TO 50 UNITS 2 X /DAY WITH MEALS FOR DIABETES   metFORMIN (GLUCOPHAGE-XR) 500 MG 24 hr tablet, TAKE 2 TABLETS TWICE DAILY WITH MEALS FOR DIABETES (DX - E11.9)   tirzepatide (MOUNJARO) 10 MG/0.5ML Pen, Inject 10 mg into the skin once a week.  Current Outpatient Medications (Cardiovascular):    amLODipine (NORVASC) 10 MG tablet, TAKE 1 TABLET DAILY FOR BLOOD PRESSURE   atorvastatin (LIPITOR) 40 MG tablet, Take 1 tab daily for cholesterol.   bisoprolol (ZEBETA) 10 MG tablet, TAKE 1 TABLET DAILY FOR BP / PATIENT KNOWS TO TAKE BY MOUTH   ezetimibe (ZETIA) 10 MG tablet, TAKE 1 TABLET BY DAILY FOR CHOLESTEROL   indapamide (LOZOL) 1.25 MG tablet, Take 1.25 mg by mouth daily.   telmisartan (MICARDIS) 40 MG tablet, TAKE 1 TABLET DAILY FOR BLOOD PRESSURE & DIABETIC KIDNEY PROTECTION  Current Outpatient Medications (Respiratory):    ciclesonide (OMNARIS) 50 MCG/ACT nasal spray, Place 2 sprays into both nostrils daily as needed for allergies.   Current Outpatient Medications (Analgesics):    aspirin EC 81 MG tablet, Take 81 mg by mouth daily. Swallow whole.   Current Outpatient Medications (Other):    AMBULATORY NON FORMULARY MEDICATION, Diltiazem gel with 2% lidocaine  Apply a pea sized amount into your rectum three times daily Dispense 30 GM zero refill   AMBULATORY NON FORMULARY MEDICATION, Medication Name: Diltiazem with 2% Lidocaine compound  Apply  to rectum 3 times daily as needed   clotrimazole-betamethasone (LOTRISONE) cream, APPLY 3 TIMES A DAY AS NEEDED   Continuous Blood Gluc Sensor (FREESTYLE LIBRE 2 SENSOR) MISC, USE TO CHECK BLOOD SUGAR DAILY   Continuous Blood Gluc Sensor MISC, 1 each by Does not apply route as directed. Use as directed every 10 days. May dispense FreeStyle [employer] or similar.   fluconazole (DIFLUCAN) 150 MG tablet, Take one tablet as needed for yeast.   glucose blood (FREESTYLE LITE) test strip, Check blood sugar 3 times a day or as directed. DX-E11.22   Insulin Syringe-Needle U-100 (INSULIN SYRINGE .5CC/31GX5/16") 31G X 5/16" 0.5 ML MISC, USE TWICE DAILY FOR INSULIN INJECTIONS   ketoconazole (NIZORAL) 2 % shampoo, APPLY 1 APPLICATION TOPICALLY 2 (TWO) TIMES A WEEK. APPLY & SHAMPOO 2 X /WEEK   methylcellulose (CITRUCEL) oral powder, Take 1 packet by mouth daily.   senna-docusate (SENOKOT-S) 8.6-50 MG tablet, Take 1 tablet by mouth every other day.   tamsulosin (FLOMAX) 0.4 MG CAPS capsule, Take 0.4 mg by  mouth daily.   Allergies:  Allergies  Allergen Reactions   Other     Ppd Black Rubber Mix   Ppd [Tuberculin Purified Protein Derivative] Other (See Comments)    POSITIVE REACTION IN PAST 11/21/11 CXR NEG   Health Maintenance:  Immunization History  Administered Date(s) Administered   Influenza Split 10/10/2013   Influenza Whole 09/08/2012   Influenza-Unspecified 09/22/2018, 09/11/2019, 09/18/2019   Moderna Covid-19 Vaccine Bivalent Booster 57yr & up 10/02/2021   Moderna SARS-COV2 Booster Vaccination 05/26/2021   Moderna Sars-Covid-2 Vaccination 02/01/2020, 03/01/2020   Pneumococcal-Unspecified 12/31/2011   Tdap 12/19/2009, 04/08/2020    Patient Care Team: MUnk Pinto MD as PCP - General (Internal Medicine)  Medical History:  has Nonallergic rhinitis; Obstructive sleep apnea-questioned; Hypertension; Hyperlipidemia associated with type 2 diabetes mellitus ([place]; Poorly  controlled type 2 diabetes mellitus ([place]; [name]; Testosterone deficiency; Morbid obesity ([place]; Poor compliance with diet; Medication management; 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   Surgical History:  He  has a past surgical history that includes Wisdom tooth extraction; Hernia repair; Colonoscopy; and Rotator cuff repair (Left, 2017 sept 5).   Family History:  His family history includes Cancer (age of onset: 864 in his father; Diabetes in his brother, father, and mother; Diabetes Mellitus II in his paternal grandfather; Hyperlipidemia in his father and mother; Hypertension in his brother, father, and mother; Myelodysplastic syndrome in his mother; Sleep apnea in his mother.   Social History:   reports that he quit smoking about 21 years ago. His smoking use included cigarettes. He has a 2.40 pack-year smoking history. He has never used smokeless tobacco. He reports current alcohol use. He reports that he does not use drugs.   Review of Systems:  Review of Systems  Constitutional: Negative.  Negative for malaise/fatigue and weight loss.  HENT: Negative.  Negative for hearing loss and tinnitus.   Eyes: Negative.  Negative for blurred vision and double vision.  Respiratory: Negative.  Negative for cough, shortness of breath and wheezing.   Cardiovascular: Negative.  Negative for chest pain, palpitations, orthopnea, claudication and leg swelling.  Gastrointestinal: Negative.  Negative for abdominal pain, blood in stool, constipation, diarrhea, heartburn, melena, nausea and vomiting.  Genitourinary: Negative.   Musculoskeletal: Negative.  Negative for joint pain and myalgias.  Skin:  Negative.  Negative for rash.  Neurological: Negative.  Negative for dizziness, tingling, sensory change, weakness and headaches.  Endo/Heme/Allergies: Negative.  Negative for polydipsia.  Psychiatric/Behavioral: Negative.    All other systems reviewed and are negative.   Physical Exam: Estimated body mass index is 34.15 kg/m as calculated from the following:   Height as of this encounter: 5' 10"  (1.778 m).   Weight as of this encounter: 238 lb (108 kg). BP 114/70   Pulse 81   Temp 97.9 F (36.6 C)   Ht 5' 10"  (1.778 m)   Wt 238 lb (108 kg)   SpO2 98%   BMI 34.15 kg/m  General Appearance: Well nourished, well dressed obese AA adult male in no apparent distress.  Eyes: PERRLA, EOMs, conjunctiva no swelling or erythema Sinuses: No Frontal/maxillary tenderness  ENT/Mouth: Ext aud canals clear, normal light reflex with TMs without erythema, bulging. Good dentition. No erythema, swelling, or exudate  on post pharynx. Tonsils not swollen or erythematous. Hearing normal.  Neck: Supple, thyroid normal. No bruits  Respiratory: Respiratory effort normal, BS equal bilaterally without rales, rhonchi, wheezing or stridor.  Cardio: RRR without murmurs, prominent S2, no rubs or gallops. Brisk peripheral pulses without edema.  Chest: symmetric, with normal excursions and percussion.  Abdomen: Soft,obese abdomen, nontender, no guarding, rebound, hernias, masses, or organomegaly.  Lymphatics: Tender right upper anterior cervical chain with single palpable lymph node; otherwise without lymphadenopathy.  Genitourinary: declines, doing self exams, no concerns, urology follows Musculoskeletal: Full ROM all peripheral extremities,5/5 strength, and normal gait.  Skin: Warm, dry without rashes, lesions, ecchymosis. Neuro: Cranial nerves intact, reflexes equal bilaterally. Normal muscle tone, no cerebellar symptoms. Sensation intact to monofilament bil feet.  Psych: Awake and oriented X 3, normal affect,  Insight and Judgment appropriate.    Laquita Harlan 10:09 AM [facility]

## 2022-08-30 ENCOUNTER — Telehealth: Payer: Self-pay

## 2022-08-30 LAB — COMPLETE METABOLIC PANEL WITH GFR
AG Ratio: 1.6 (calc) (ref 1.0–2.5)
ALT: 11 U/L (ref 9–46)
AST: 12 U/L (ref 10–35)
Albumin: 4.4 g/dL (ref 3.6–5.1)
Alkaline phosphatase (APISO): 72 U/L (ref 35–144)
BUN: 20 mg/dL (ref 7–25)
CO2: 29 mmol/L (ref 20–32)
Calcium: 9.6 mg/dL (ref 8.6–10.3)
Chloride: 100 mmol/L (ref 98–110)
Creat: 1.11 mg/dL (ref 0.70–1.30)
Globulin: 2.8 g/dL (calc) (ref 1.9–3.7)
Glucose, Bld: 93 mg/dL (ref 65–99)
Potassium: 3.9 mmol/L (ref 3.5–5.3)
Sodium: 138 mmol/L (ref 135–146)
Total Bilirubin: 0.3 mg/dL (ref 0.2–1.2)
Total Protein: 7.2 g/dL (ref 6.1–8.1)
eGFR: 76 mL/min/{1.73_m2} (ref >=60)

## 2022-08-30 LAB — CBC WITH DIFFERENTIAL/PLATELET
Absolute Monocytes: 810 cells/uL (ref 200–950)
Basophils Absolute: 30 cells/uL (ref 0–200)
Basophils Relative: 0.5 %
Eosinophils Absolute: 312 cells/uL (ref 15–500)
Eosinophils Relative: 5.2 %
HCT: 43.3 % (ref 38.5–50.0)
Hemoglobin: 14 g/dL (ref 13.2–17.1)
Lymphs Abs: 2094 cells/uL (ref 850–3900)
MCH: 26.8 pg — ABNORMAL LOW (ref 27.0–33.0)
MCHC: 32.3 g/dL (ref 32.0–36.0)
MCV: 83 fL (ref 80.0–100.0)
MPV: 9.3 fL (ref 7.5–12.5)
Monocytes Relative: 13.5 %
Neutro Abs: 2754 cells/uL (ref 1500–7800)
Neutrophils Relative %: 45.9 %
Platelets: 331 10*3/uL (ref 140–400)
RBC: 5.22 10*6/uL (ref 4.20–5.80)
RDW: 13.5 % (ref 11.0–15.0)
Total Lymphocyte: 34.9 %
WBC: 6 10*3/uL (ref 3.8–10.8)

## 2022-08-30 LAB — MICROALBUMIN / CREATININE URINE RATIO
Creatinine, Urine: 166 mg/dL (ref 20–320)
Microalb Creat Ratio: 4 mcg/mg creat (ref <30)
Microalb, Ur: 0.7 mg/dL

## 2022-08-30 LAB — LIPID PANEL
Cholesterol: 87 mg/dL (ref <200)
HDL: 29 mg/dL — ABNORMAL LOW (ref >=40)
LDL Cholesterol (Calc): 42 mg/dL (calc)
Non-HDL Cholesterol (Calc): 58 mg/dL (calc) (ref <130)
Total CHOL/HDL Ratio: 3 (calc) (ref <5.0)
Triglycerides: 75 mg/dL (ref <150)

## 2022-08-30 LAB — URINALYSIS, ROUTINE W REFLEX MICROSCOPIC
Bilirubin Urine: NEGATIVE
Glucose, UA: NEGATIVE
Hgb urine dipstick: NEGATIVE
Ketones, ur: NEGATIVE
Leukocytes,Ua: NEGATIVE
Nitrite: NEGATIVE
Protein, ur: NEGATIVE
Specific Gravity, Urine: 1.025 (ref 1.001–1.035)
pH: 5.5 (ref 5.0–8.0)

## 2022-08-30 LAB — HEMOGLOBIN A1C
Hgb A1c MFr Bld: 6.8 % of total Hgb — ABNORMAL HIGH (ref <5.7)
Mean Plasma Glucose: 148 mg/dL
eAG (mmol/L): 8.2 mmol/L

## 2022-08-30 LAB — URIC ACID: Uric Acid, Serum: 7 mg/dL (ref 4.0–8.0)

## 2022-08-30 LAB — VITAMIN D 25 HYDROXY (VIT D DEFICIENCY, FRACTURES): Vit D, 25-Hydroxy: 69 ng/mL (ref 30–100)

## 2022-08-30 LAB — TESTOSTERONE: Testosterone: 230 ng/dL — ABNORMAL LOW (ref 250–827)

## 2022-08-30 NOTE — Telephone Encounter (Signed)
Pantoprazole prior auth completed and submitted.   Prior auth denied.

## 2022-08-31 ENCOUNTER — Other Ambulatory Visit: Payer: Self-pay | Admitting: Nurse Practitioner

## 2022-09-01 ENCOUNTER — Other Ambulatory Visit: Payer: Self-pay | Admitting: Nurse Practitioner

## 2022-09-01 ENCOUNTER — Encounter: Payer: Self-pay | Admitting: Nurse Practitioner

## 2022-09-01 DIAGNOSIS — Z794 Long term (current) use of insulin: Secondary | ICD-10-CM

## 2022-09-01 DIAGNOSIS — E349 Endocrine disorder, unspecified: Secondary | ICD-10-CM

## 2022-09-07 ENCOUNTER — Other Ambulatory Visit: Payer: Self-pay | Admitting: Nurse Practitioner

## 2022-09-07 MED ORDER — TESTOSTERONE CYPIONATE 200 MG/ML IM SOLN: 200.0000 mg | INTRAMUSCULAR | 1 refills | Status: DC

## 2022-09-21 DIAGNOSIS — Z23 Encounter for immunization: Secondary | ICD-10-CM | POA: Diagnosis not present

## 2022-11-21 ENCOUNTER — Encounter: Payer: Self-pay | Admitting: Internal Medicine

## 2022-11-25 ENCOUNTER — Other Ambulatory Visit: Payer: Self-pay | Admitting: Nurse Practitioner

## 2022-11-25 DIAGNOSIS — E1165 Type 2 diabetes mellitus with hyperglycemia: Secondary | ICD-10-CM

## 2022-11-29 ENCOUNTER — Encounter: Payer: Self-pay | Admitting: Nurse Practitioner

## 2022-11-29 ENCOUNTER — Ambulatory Visit: Payer: BC Managed Care – PPO | Admitting: Nurse Practitioner

## 2022-11-29 VITALS — BP 108/72 | HR 85 | Temp 97.3°F | Ht 70.0 in | Wt 231.0 lb

## 2022-11-29 DIAGNOSIS — I700 Atherosclerosis of aorta: Secondary | ICD-10-CM

## 2022-11-29 DIAGNOSIS — E1169 Type 2 diabetes mellitus with other specified complication: Secondary | ICD-10-CM

## 2022-11-29 DIAGNOSIS — G4733 Obstructive sleep apnea (adult) (pediatric): Secondary | ICD-10-CM

## 2022-11-29 DIAGNOSIS — Z79899 Other long term (current) drug therapy: Secondary | ICD-10-CM

## 2022-11-29 DIAGNOSIS — E349 Endocrine disorder, unspecified: Secondary | ICD-10-CM

## 2022-11-29 DIAGNOSIS — N521 Erectile dysfunction due to diseases classified elsewhere: Secondary | ICD-10-CM

## 2022-11-29 DIAGNOSIS — E1159 Type 2 diabetes mellitus with other circulatory complications: Secondary | ICD-10-CM

## 2022-11-29 DIAGNOSIS — I10 Essential (primary) hypertension: Secondary | ICD-10-CM | POA: Diagnosis not present

## 2022-11-29 DIAGNOSIS — E785 Hyperlipidemia, unspecified: Secondary | ICD-10-CM

## 2022-11-29 DIAGNOSIS — K219 Gastro-esophageal reflux disease without esophagitis: Secondary | ICD-10-CM

## 2022-11-29 DIAGNOSIS — E1122 Type 2 diabetes mellitus with diabetic chronic kidney disease: Secondary | ICD-10-CM

## 2022-11-29 DIAGNOSIS — I708 Atherosclerosis of other arteries: Secondary | ICD-10-CM

## 2022-11-29 DIAGNOSIS — N182 Chronic kidney disease, stage 2 (mild): Secondary | ICD-10-CM

## 2022-11-29 DIAGNOSIS — Z794 Long term (current) use of insulin: Secondary | ICD-10-CM

## 2022-11-29 NOTE — Progress Notes (Signed)
3 MONTH FOLLOW UP  Assessment and Plan:  Atherosclerosis of aortic bifurcation and common iliac arteries ([place]) Per MRI 2021 Implement lifestyle  modifications. Control blood pressure, cholesterol, glucose, increase exercise.    Essential hypertension Continue Amlodipine & Telmisartan & Indapamide Discussed DASH (Dietary Approaches to Stop Hypertension) DASH diet is lower in sodium than a typical American diet. Cut back on foods that are high in saturated fat, cholesterol, and trans fats. Eat more whole-grain foods, fish, poultry, and nuts Remain active and exercise as tolerated daily.  Monitor BP at home-Call if greater than 130/80.  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 Recommended diet heavy in fruits and veggies, omega 3's. Decrease consumption of animal meats, cheeses, and dairy products. Remain active and exercise as tolerated. Continue to monitor. Check lipids/TSH   Uncontrolled type 2 diabetes mellitus with stage 2 chronic kidney disease, with long-term current use of insulin (HCC) Continue Libra Review of AGP report for 05/2022 for Russell [name] reveals average glucose of 145. Revuie of AGP report for 7/8/-7/21 for Russell [name] reveals average glucose of 138 Continue Mounjaro 10 mg, Metformin, Insulin 70/30 PRN due to BG readings.  Discussed general issues about diabetes pathophysiology and management., Educational material distributed., Suggested low cholesterol diet., Encouraged aerobic  exercise., Discussed foot care., Reminded to get yearly retinal exam.  CKD stage 2 due to type 2 diabetes mellitus ([place]) Continue bASA, statin Discussed how what you eat and drink can aide in kidney protection. Stay well hydrated. Avoid high salt foods. Avoid NSAIDS. Keep BP and BG well controlled.   Take medications as prescribed. Remain active and exercise as tolerated daily. Maintain weight.  Continue to monitor. Check CMP/GFR/Microablumin/UA   Morbid obesity (HCC) Down 13 lb Discussed appropriate BMI Goal of losing 1 lb per month. Diet modification. Physical activity. Encouraged/praised to build confidence.  Testosterone deficiency Continue Testosterone injections Monitor levels  Obstructive sleep apnea-questioned Continue CPAP, weight loss  Continue to follow with Pulmonology  Medication Management All medications discussed and reviewed in full. All questions and concerns regarding medications addressed.    GERD Start Pantoprazole. No suspected reflux complications ([place]/stricture). Lifestyle modification:  wt loss, avoid meals 2-3h before bedtime. Consider eliminating food triggers:  chocolate, caffeine, EtOH, acid/spicy food. Continue to follow with GI.  Orders Placed This Encounter  Procedures   CBC with Differential/Platelet   COMPLETE METABOLIC PANEL WITH GFR   Lipid panel   Hemoglobin A1c     Notify office for further evaluation and treatment, questions or concerns if any reported s/s fail to improve.   The patient was advised to call back or seek an in-person evaluation if any symptoms worsen or if the condition fails to improve as anticipated.   Further disposition pending results of labs. Discussed med's effects and SE's.    I discussed the assessment and treatment plan with the patient. The patient was provided an opportunity to ask questions and all were answered. The patient agreed with the plan and demonstrated an understanding of the  instructions.  Discussed med's effects and SE's. Screening labs and tests as requested with regular follow-up as recommended.  I  provided 20 minutes of face-to-face time during this encounter including counseling, chart review, and critical decision making was preformed.   Future Appointments  Date Time Provider [place]  04/22/2023  8:00 AM Debbora Presto, NP [facility] None  05/20/2023 10:00 AM Ireta Pullman, Kenney Houseman, NP [facility] None     HPI 61 y.o. male patient presents for a general follow up. 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  Overall he reports feeling well.  He has no new or additional concerns today.  He is married, 3 children, 4 grandchildren.   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 Continues to help in the community.   Severe OSA per sleep study in 2014, he has a new CPAP,   He is continuing to manage GERD symptoms.  Has been on Mounjaro for 4  months.  Has taken Prevacid OTC and Zantac with some improvement.  His continuing to lose weight.  He is very pleased with his weight loss.  BMI is Body mass index is 33.15 kg/m., he has been working on diet and exercise. Wt Readings from Last 3 Encounters:  11/29/22 231 lb (104.8 kg)  08/29/22 238 lb (108 kg)  05/17/22 251 lb (113.9 kg)   He follows with Dr. Clover Mealy  for perirenal hematoma, had MRI AB 02/2020 for monitoring that showed atherosclerosis of the aorta/illiac disease.   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  He denies chest pain, shortness of breath, dizziness.   He has been working on diet and exercise for Diabetes  He is now taking Mounjuaro, after trying Ozempic which failed.  He is on 70/30 insulin he is taking variable, 10-12 units in am depending on glucose reading.  He continues with Metformin 527m, two tablets twice a day  he has continuous glucose monitor LOrlene Plumwhich is helping him to better control his glucose.  Last A1C was:  Lab Results  Component Value Date   HGBA1C 6.8 (H) 08/29/2022   Follows with Dr. GMoshe Ciprodue to hx of periphephretic hematoma following stone. Last GFR: Lab Results  Component Value Date   GFRAA 77 05/17/2021   Lab Results  Component Value Date   CHOL 87 08/29/2022   HDL 29 (L) 08/29/2022   LDLCALC 42 08/29/2022   TRIG 75 08/29/2022   CHOLHDL 3.0 08/29/2022   Patient is on Vitamin D supplement.   Lab Results  Component Value Date   VD25OH 68009/20/2023    He has a history of testosterone deficiency and is on testosterone replacement. He is prescribed 400 mg q2w, but hasn't been keeping up with this, last injection was 02/23/2021. He is requesting to restart today. .Marland Kitchene states that the testosterone helps with his energy.  Lab Results  Component Value Date   TESTOSTERONE  230 (L) 08/29/2022    He reports having kidneys stone that are full of uric acid.  Has noticed a recent gout flare in his right toe.  He has stopped eating "[place] [employer]."   Reports seasonal allergies with sneezing, watery eyes, nasal congestion.  Worse around noon and this time of year when seasons change  He does not take an OTC  antihistamine. Current Medications:    Current Outpatient Medications (Endocrine & Metabolic):    metFORMIN (GLUCOPHAGE-XR) 500 MG 24 hr tablet, TAKE 2 TABLETS TWICE DAILY WITH MEALS FOR DIABETES (DX - E11.9)   MOUNJARO 10 MG/0.5ML Pen, INJECT 10 MG INTO THE SKIN ONCE A WEEK   NOVOLIN 70/30 (70-30) 100 UNIT/ML injection, INJECT UP TO 50 UNITS 2 X /DAY WITH MEALS FOR DIABETES   testosterone cypionate (DEPOTESTOSTERONE CYPIONATE) 200 MG/ML injection, Inject 1 mL (200 mg total) into the muscle every 14 (fourteen) days.  Current Outpatient Medications (Cardiovascular):    amLODipine (NORVASC) 10 MG tablet, TAKE 1 TABLET DAILY FOR BLOOD PRESSURE   atorvastatin (LIPITOR) 40 MG tablet, Take 1 tab daily for cholesterol.   bisoprolol (ZEBETA) 10 MG tablet, TAKE 1 TABLET DAILY FOR BP / PATIENT KNOWS TO TAKE BY MOUTH   ezetimibe (ZETIA) 10 MG tablet, TAKE 1 TABLET BY DAILY FOR CHOLESTEROL   indapamide (LOZOL) 1.25 MG tablet, Take 1.25 mg by mouth daily.   telmisartan (MICARDIS) 40 MG tablet, TAKE 1 TABLET DAILY FOR BLOOD PRESSURE & DIABETIC KIDNEY PROTECTION  Current Outpatient Medications (Respiratory):    ciclesonide (OMNARIS) 50 MCG/ACT nasal spray, Place 2 sprays into both nostrils daily as needed for allergies.   Current Outpatient Medications (Analgesics):    aspirin EC 81 MG tablet, Take 81 mg by mouth daily. Swallow whole.   Current Outpatient Medications (Other):    AMBULATORY NON FORMULARY MEDICATION, Diltiazem gel with 2% lidocaine  Apply a pea sized amount into your rectum three times daily Dispense 30 GM zero refill   AMBULATORY NON FORMULARY MEDICATION, Medication Name: Diltiazem with 2% Lidocaine compound  Apply to rectum 3 times daily as needed   clotrimazole-betamethasone (LOTRISONE) cream, APPLY 3 TIMES A DAY AS NEEDED   Continuous Blood Gluc Sensor (FREESTYLE LIBRE 2 SENSOR) MISC, USE TO CHECK BLOOD SUGAR DAILY   Continuous Blood Gluc Sensor MISC, 1 each by Does not apply route  as directed. Use as directed every 10 days. May dispense FreeStyle [employer] or similar.   fluconazole (DIFLUCAN) 150 MG tablet, Take one tablet as needed for yeast.   glucose blood (FREESTYLE LITE) test strip, Check blood sugar 3 times a day or as directed. DX-E11.22   ketoconazole (NIZORAL) 2 % shampoo, APPLY 1 APPLICATION TOPICALLY 2 (TWO) TIMES A WEEK. APPLY & SHAMPOO 2 X /WEEK   methylcellulose (CITRUCEL) oral powder, Take 1 packet by mouth daily.   pantoprazole (PROTONIX) 20 MG tablet, TAKE 1 TABLET(20 MG) BY MOUTH DAILY   senna-docusate (SENOKOT-S) 8.6-50 MG tablet, Take 1 tablet by mouth every other day.   tamsulosin (FLOMAX) 0.4 MG CAPS capsule, Take 0.4 mg by mouth daily.   TRUEPLUS INSULIN SYRINGE 31G X 5/16" 0.5 ML MISC, USE TO INJECT INSULIN TWICE DAILY AS DIRECTED   Allergies:  Allergies  Allergen Reactions   Other     Ppd Black Rubber Mix   Ppd [Tuberculin Purified Protein Derivative] Other (See Comments)    POSITIVE REACTION IN PAST 11/21/11 CXR NEG   Health Maintenance:  Immunization History  Administered Date(s) Administered  Influenza Split 10/10/2013   Influenza Whole 09/08/2012   Influenza-Unspecified 09/22/2018, 09/11/2019, 09/18/2019   Moderna Covid-19 Vaccine Bivalent Booster 61yr & up 10/02/2021   Moderna SARS-COV2 Booster Vaccination 05/26/2021   Moderna Sars-Covid-2 Vaccination 02/01/2020, 03/01/2020   Pneumococcal-Unspecified 12/31/2011   Tdap 12/19/2009, 04/08/2020    Patient Care Team: MUnk Pinto MD as PCP - General (Internal Medicine)  Medical History:  has Nonallergic rhinitis; Obstructive sleep apnea-questioned; Hypertension; Hyperlipidemia associated with type 2 diabetes mellitus ([place]; Poorly controlled type 2 diabetes mellitus ([place]; [name]; Testosterone deficiency; Morbid obesity ([place]; Poor compliance with diet; Medication management; 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   Surgical History:  He  has a past surgical history that includes Wisdom tooth extraction; Hernia repair; Colonoscopy; and Rotator cuff repair (Left, 2017 sept 5).   Family History:  His family history includes Cancer (age of onset: 873 in his father; Diabetes in his brother, father, and mother; Diabetes Mellitus II in his paternal grandfather; Hyperlipidemia in his father and mother; Hypertension in his brother, father, and mother; Myelodysplastic syndrome in his mother; Sleep apnea in his mother.   Social History:   reports that he quit smoking about 21 years ago. His smoking use included cigarettes. He has a 2.40 pack-year smoking history. He has never used smokeless tobacco. He reports current alcohol use. He reports that he does not use drugs.   Review of Systems:  Review of Systems  Constitutional: Negative.  Negative for malaise/fatigue and weight loss.  HENT: Negative.  Negative for hearing loss and tinnitus.   Eyes: Negative.  Negative for blurred vision and double vision.  Respiratory: Negative.  Negative for cough, shortness of breath and wheezing.   Cardiovascular: Negative.  Negative for chest pain, palpitations, orthopnea, claudication and leg swelling.  Gastrointestinal: Negative.  Negative for abdominal pain, blood in stool, constipation, diarrhea, heartburn, melena, nausea and vomiting.  Genitourinary: Negative.   Musculoskeletal: Negative.  Negative for joint pain and myalgias.  Skin: Negative.  Negative for rash.  Neurological: Negative.  Negative for dizziness, tingling, sensory change, weakness and headaches.  Endo/Heme/Allergies: Negative.  Negative for polydipsia.  Psychiatric/Behavioral: Negative.    All other  systems reviewed and are negative.   Physical Exam: Estimated body mass index is 33.15 kg/m as calculated from the following:   Height as of this encounter: _0  (1.778 m).   Weight as of this encounter: 231 lb (104.8 kg). BP 108/72   Pulse 85   Temp (!) 97.3 F (36.3 C)   Ht _1  (1.778 m)   Wt 231 lb (104.8 kg)   SpO2 98%   BMI 33.15 kg/m  General Appearance: Well nourished, well dressed obese AA adult male in no apparent distress.  Eyes: PERRLA, EOMs, conjunctiva no swelling or erythema Sinuses: No Frontal/maxillary tenderness  ENT/Mouth: Ext aud canals clear, normal light reflex with TMs without erythema, bulging. Good dentition. No erythema, swelling, or exudate on post pharynx. Tonsils not swollen or erythematous. Hearing normal.  Neck: Supple, thyroid normal. No bruits  Respiratory: Respiratory effort normal, BS equal bilaterally without rales, rhonchi, wheezing or stridor.  Cardio: RRR without murmurs, prominent S2, no rubs or gallops. Brisk peripheral pulses without edema.  Chest: symmetric, with normal excursions  and percussion.  Abdomen: Soft,obese abdomen, nontender, no guarding, rebound, hernias, masses, or organomegaly.  Lymphatics: Tender right upper anterior cervical chain with single palpable lymph node; otherwise without lymphadenopathy.  Genitourinary: declines, doing self exams, no concerns, urology follows Musculoskeletal: Full ROM all peripheral extremities,5/5 strength, and normal gait.  Skin: Warm, dry without rashes, lesions, ecchymosis. Neuro: Cranial nerves intact, reflexes equal bilaterally. Normal muscle tone, no cerebellar symptoms. Sensation intact to monofilament bil feet.  Psych: Awake and oriented X 3, normal affect, Insight and Judgment appropriate.    Russell [name] 9:50 AM [facility]

## 2022-11-29 NOTE — Patient Instructions (Signed)

## 2022-11-30 LAB — HEMOGLOBIN A1C
Hgb A1c MFr Bld: 6.7 % of total Hgb — ABNORMAL HIGH (ref <5.7)
Mean Plasma Glucose: 146 mg/dL
eAG (mmol/L): 8.1 mmol/L

## 2022-11-30 LAB — COMPLETE METABOLIC PANEL WITH GFR
AG Ratio: 1.4 (calc) (ref 1.0–2.5)
ALT: 17 U/L (ref 9–46)
AST: 13 U/L (ref 10–35)
Albumin: 4.2 g/dL (ref 3.6–5.1)
Alkaline phosphatase (APISO): 64 U/L (ref 35–144)
BUN: 24 mg/dL (ref 7–25)
CO2: 28 mmol/L (ref 20–32)
Calcium: 10 mg/dL (ref 8.6–10.3)
Chloride: 100 mmol/L (ref 98–110)
Creat: 1.2 mg/dL (ref 0.70–1.35)
Globulin: 2.9 g/dL (calc) (ref 1.9–3.7)
Glucose, Bld: 109 mg/dL — ABNORMAL HIGH (ref 65–99)
Potassium: 4.5 mmol/L (ref 3.5–5.3)
Sodium: 138 mmol/L (ref 135–146)
Total Bilirubin: 0.3 mg/dL (ref 0.2–1.2)
Total Protein: 7.1 g/dL (ref 6.1–8.1)
eGFR: 69 mL/min/{1.73_m2} (ref >=60)

## 2022-11-30 LAB — CBC WITH DIFFERENTIAL/PLATELET
Absolute Monocytes: 624 cells/uL (ref 200–950)
Basophils Absolute: 19 cells/uL (ref 0–200)
Basophils Relative: 0.3 %
Eosinophils Absolute: 170 cells/uL (ref 15–500)
Eosinophils Relative: 2.7 %
HCT: 46.7 % (ref 38.5–50.0)
Hemoglobin: 15.4 g/dL (ref 13.2–17.1)
Lymphs Abs: 2054 cells/uL (ref 850–3900)
MCH: 26.6 pg — ABNORMAL LOW (ref 27.0–33.0)
MCHC: 33 g/dL (ref 32.0–36.0)
MCV: 80.7 fL (ref 80.0–100.0)
MPV: 9.4 fL (ref 7.5–12.5)
Monocytes Relative: 9.9 %
Neutro Abs: 3434 cells/uL (ref 1500–7800)
Neutrophils Relative %: 54.5 %
Platelets: 302 10*3/uL (ref 140–400)
RBC: 5.79 10*6/uL (ref 4.20–5.80)
RDW: 14.1 % (ref 11.0–15.0)
Total Lymphocyte: 32.6 %
WBC: 6.3 10*3/uL (ref 3.8–10.8)

## 2022-11-30 LAB — LIPID PANEL
Cholesterol: 84 mg/dL (ref <200)
HDL: 29 mg/dL — ABNORMAL LOW (ref >=40)
LDL Cholesterol (Calc): 40 mg/dL (calc)
Non-HDL Cholesterol (Calc): 55 mg/dL (calc) (ref <130)
Total CHOL/HDL Ratio: 2.9 (calc) (ref <5.0)
Triglycerides: 69 mg/dL (ref <150)

## 2023-01-22 ENCOUNTER — Other Ambulatory Visit: Payer: Self-pay | Admitting: Internal Medicine

## 2023-01-22 DIAGNOSIS — E1122 Type 2 diabetes mellitus with diabetic chronic kidney disease: Secondary | ICD-10-CM

## 2023-01-24 ENCOUNTER — Ambulatory Visit: Payer: BC Managed Care – PPO

## 2023-01-24 VITALS — BP 100/64 | HR 74 | Temp 98.1°F | Ht 70.0 in | Wt 236.6 lb

## 2023-01-24 DIAGNOSIS — E349 Endocrine disorder, unspecified: Secondary | ICD-10-CM

## 2023-01-24 MED ORDER — TESTOSTERONE CYPIONATE 200 MG/ML IM SOLN
200.0000 mg | Freq: Once | INTRAMUSCULAR | Status: AC
  Administered 2023-01-24: 200 mg via INTRAMUSCULAR

## 2023-01-24 NOTE — Progress Notes (Signed)
Patient here for a Testosterone injection 19m/1ml IM injection in LEFT upper outer quadrant. Patient tolerated well and will return in 14 days for next injection.

## 2023-01-30 DIAGNOSIS — R351 Nocturia: Secondary | ICD-10-CM | POA: Diagnosis not present

## 2023-01-30 DIAGNOSIS — N401 Enlarged prostate with lower urinary tract symptoms: Secondary | ICD-10-CM | POA: Diagnosis not present

## 2023-02-07 ENCOUNTER — Ambulatory Visit: Payer: BC Managed Care – PPO

## 2023-02-07 ENCOUNTER — Ambulatory Visit: Payer: BC Managed Care – PPO | Admitting: Nurse Practitioner

## 2023-02-07 ENCOUNTER — Encounter: Payer: Self-pay | Admitting: Nurse Practitioner

## 2023-02-07 VITALS — BP 108/64 | HR 83 | Temp 98.1°F | Ht 70.0 in | Wt 239.8 lb

## 2023-02-07 DIAGNOSIS — N200 Calculus of kidney: Secondary | ICD-10-CM

## 2023-02-07 DIAGNOSIS — B372 Candidiasis of skin and nail: Secondary | ICD-10-CM

## 2023-02-07 DIAGNOSIS — I10 Essential (primary) hypertension: Secondary | ICD-10-CM | POA: Diagnosis not present

## 2023-02-07 DIAGNOSIS — R300 Dysuria: Secondary | ICD-10-CM

## 2023-02-07 DIAGNOSIS — E349 Endocrine disorder, unspecified: Secondary | ICD-10-CM | POA: Diagnosis not present

## 2023-02-07 MED ORDER — CLOTRIMAZOLE-BETAMETHASONE 1-0.05 % EX CREA: TOPICAL_CREAM | CUTANEOUS | 1 refills | Status: AC

## 2023-02-07 MED ORDER — TESTOSTERONE CYPIONATE 200 MG/ML IM SOLN
200.0000 mg | Freq: Once | INTRAMUSCULAR | Status: AC
  Administered 2023-02-07: 100 mg via INTRAMUSCULAR

## 2023-02-07 NOTE — Progress Notes (Signed)
Patient here for a Testosterone injection '100mg'$ /47m IM injection in RIGHT  upper outer quadrant. Patient tolerated well and will return in 14 days for next injection.

## 2023-02-07 NOTE — Progress Notes (Signed)
Assessment and Plan: Diagnoses and all orders for this visit:  Essential hypertension - continue medications, DASH diet, exercise and monitor at home. Call if greater than 130/80.   Morbid obesity ([place]) Long discussion about weight loss, diet, and exercise Recommended diet heavy in fruits and veggies and low in animal meats, cheeses, and dairy products, appropriate calorie intake Patient will work on decreasing saturated fats and simple carbs Continue Mounjaro Follow up at next visit  Dysuria/Kidney stones Believes he passed a kidney stone this afternoon and pain in back is starting to subside Continue to push water and use Flomax -     Urinalysis, Routine w reflex microscopic -     Urine Culture  Candidal skin infection Use as needed -     clotrimazole-betamethasone (LOTRISONE) cream; APPLY 3 TIMES A DAY AS NEEDED       Further disposition pending results of labs. Discussed med's effects and SE's.   Over 30 minutes of exam, counseling, chart review, and critical decision making was performed.   Future Appointments  Date Time Provider [place]  03/08/2023  9:30 AM Darrol Jump, NP [facility] None  04/22/2023  8:00 AM Lomax, Amy, NP [facility] None  06/25/2023  9:00 AM [place], Kenney Houseman, NP [facility] None    ------------------------------------------------------------------------------------------------------------------   HPI BP 108/64   Pulse 83   Temp 98.1 F (36.7 C)   Ht '5\' 10"'$  (1.778 m)   Wt 239 lb 12.8 oz (108.8 kg)   SpO2 99%   BMI 34.41 kg/m   61 y.o.male presents for low back pain on right side which began 5 days ago. He states the he did pass a kidney stone today- felt it pass, does feel like the pain is starting to get less. Marland Kitchen  Has history of previous kidney stones. He was getting his grandson out of car seat on Saturday and felt a tweak in his right flank and questioned muscle pain.  Has been taking Tylenol 2-3 times a day x past 4 days. He is using  Flomax.   BP is currently well controlled with Bisoprolol 10 mg QD, Amlodipine 10 ng QD, Telmisartan 40 mg qd and Indapamide 1.25 mg QD  . Denies headaches, chest pain, shortness of breath and dizziness.  BP Readings from Last 3 Encounters:  02/07/23 108/64  02/07/23 108/64  01/24/23 100/64   BMI is Body mass index is 34.41 kg/m., he has not been working on diet and exercise. Wt Readings from Last 3 Encounters:  02/07/23 239 lb 12.8 oz (108.8 kg)  02/07/23 239 lb 12.8 oz (108.8 kg)  01/24/23 236 lb 9.6 oz (107.3 kg)      Past Medical History:  Diagnosis Date   Allergic rhinitis    Allergy    Chronic sinusitis    Diabetes mellitus ([place])    Hyperlipidemia    Hypertension    Hypogonadism male    Perinephric hematoma 07/16/2015     Allergies  Allergen Reactions   Other     Ppd Black Rubber Mix   Ppd [Tuberculin Purified Protein Derivative] Other (See Comments)    POSITIVE REACTION IN PAST 11/21/11 CXR NEG    Current Outpatient Medications on File Prior to Visit  Medication Sig   AMBULATORY NON FORMULARY MEDICATION Diltiazem gel with 2% lidocaine  Apply a pea sized amount into your rectum three times daily Dispense 30 GM zero refill   AMBULATORY NON FORMULARY MEDICATION Medication Name: Diltiazem with 2% Lidocaine compound  Apply to rectum 3 times daily  as needed   amLODipine (NORVASC) 10 MG tablet TAKE 1 TABLET DAILY FOR BLOOD PRESSURE   aspirin EC 81 MG tablet Take 81 mg by mouth daily. Swallow whole.   atorvastatin (LIPITOR) 40 MG tablet Take 1 tab daily for cholesterol.   bisoprolol (ZEBETA) 10 MG tablet TAKE 1 TABLET DAILY FOR BP / PATIENT KNOWS TO TAKE BY MOUTH   ciclesonide (OMNARIS) 50 MCG/ACT nasal spray Place 2 sprays into both nostrils daily as needed for allergies.    clotrimazole-betamethasone (LOTRISONE) cream APPLY 3 TIMES A DAY AS NEEDED   Continuous Blood Gluc Sensor (FREESTYLE LIBRE 2 SENSOR) MISC USE TO CHECK BLOOD SUGAR DAILY   Continuous Blood Gluc  Sensor MISC 1 each by Does not apply route as directed. Use as directed every 10 days. May dispense FreeStyle Libre Sensor System or similar.   ezetimibe (ZETIA) 10 MG tablet TAKE 1 TABLET BY DAILY FOR CHOLESTEROL   fluconazole (DIFLUCAN) 150 MG tablet Take one tablet as needed for yeast.   glucose blood (FREESTYLE LITE) test strip Check blood sugar 3 times a day or as directed. DX-E11.22   indapamide (LOZOL) 1.25 MG tablet Take 1.25 mg by mouth daily.   ketoconazole (NIZORAL) 2 % shampoo APPLY 1 APPLICATION TOPICALLY 2 (TWO) TIMES A WEEK. APPLY & SHAMPOO 2 X /WEEK   metFORMIN (GLUCOPHAGE-XR) 500 MG 24 hr tablet TAKE 2 TABLETS TWICE DAILY WITH MEALS FOR DIABETES (DX - E11.9)   methylcellulose (CITRUCEL) oral powder Take 1 packet by mouth daily.   MOUNJARO 10 MG/0.5ML Pen INJECT 10 MG INTO THE SKIN ONCE A WEEK   NOVOLIN 70/30 (70-30) 100 UNIT/ML injection INJECT UP TO 50 UNITS 2 X /DAY WITH MEALS FOR DIABETES   pantoprazole (PROTONIX) 20 MG tablet TAKE 1 TABLET(20 MG) BY MOUTH DAILY   senna-docusate (SENOKOT-S) 8.6-50 MG tablet Take 1 tablet by mouth every other day.   tamsulosin (FLOMAX) 0.4 MG CAPS capsule Take 0.4 mg by mouth daily.   telmisartan (MICARDIS) 40 MG tablet TAKE 1 TABLET DAILY FOR BLOOD PRESSURE & DIABETIC KIDNEY PROTECTION   testosterone cypionate (DEPOTESTOSTERONE CYPIONATE) 200 MG/ML injection Inject 1 mL (200 mg total) into the muscle every 14 (fourteen) days.   TRUEPLUS INSULIN SYRINGE 31G X 5/16" 0.5 ML MISC USE TO INJECT INSULIN TWICE DAILY AS DIRECTED   No current facility-administered medications on file prior to visit.    ROS: all negative except above.   Physical Exam:  BP 108/64   Pulse 83   Temp 98.1 F (36.7 C)   Ht '5\' 10"'$  (1.778 m)   Wt 239 lb 12.8 oz (108.8 kg)   SpO2 99%   BMI 34.41 kg/m   General Appearance: Well nourished, in no apparent distress. Eyes: PERRLA, EOMs, conjunctiva no swelling or erythema Sinuses: No Frontal/maxillary  tenderness ENT/Mouth: Ext aud canals clear, TMs without erythema, bulging. No erythema, swelling, or exudate on post pharynx.  Tonsils not swollen or erythematous. Hearing normal.  Neck: Supple, thyroid normal.  Respiratory: Respiratory effort normal, BS equal bilaterally without rales, rhonchi, wheezing or stridor.  Cardio: RRR with no MRGs. Brisk peripheral pulses without edema.  Abdomen: Soft, + BS.  Non tender, no guarding, rebound, hernias, masses. Lymphatics: Non tender without lymphadenopathy.  Musculoskeletal: Full ROM, 5/5 strength, normal gait.  Skin: Warm, dry without rashes, lesions, ecchymosis.  Neuro: Cranial nerves intact. Normal muscle tone, no cerebellar symptoms. Sensation intact.  Psych: Awake and oriented X 3, normal affect, Insight and Judgment appropriate.  Alycia Rossetti, NP 2:06 PM [facility]

## 2023-02-07 NOTE — Patient Instructions (Signed)
Kidney Stones  Kidney stones are solid, rock-like deposits that form inside of the kidneys. The kidneys are a pair of organs that make urine. A kidney stone may form in a kidney and move into other parts of the urinary tract, including the tubes that connect the kidneys to the bladder (ureters), the bladder, and the tube that carries urine out of the body (urethra). As the stone moves through these areas, it can cause intense pain and block the flow of urine. Kidney stones are created when high levels of certain minerals are found in the urine. The stones are usually passed out of the body through urination, but in some cases, medical treatment may be needed to remove them. What are the causes? Kidney stones may be caused by: A condition in which certain glands produce too much parathyroid hormone (primary hyperparathyroidism), which causes too much calcium buildup in the blood. A buildup of uric acid crystals in the bladder (hyperuricosuria). Uric acid is a chemical that the body produces when you eat certain foods. It usually leaves the body in the urine. Narrowing (stricture) of one or both of the ureters. A kidney blockage that is present at birth (congenital obstruction). Past surgery on the kidney or the ureters. What increases the risk? The following factors may make you more likely to develop this condition: Having had a kidney stone in the past. Having a family history of kidney stones. Not drinking enough water. Eating a diet that is high in protein, salt (sodium), or sugar. Being overweight or obese. What are the signs or symptoms? Symptoms of a kidney stone may include: Pain in the side of the abdomen, right below the ribs (flank pain). Pain usually spreads (radiates) to the groin. Needing to urinate often or urgently. Painful urination. Blood in the urine (hematuria). Nausea. Vomiting. Fever and chills. How is this diagnosed? This condition may be diagnosed based on: Your  symptoms and medical history. A physical exam. Blood tests. Urine tests. These may be done before and after the stone passes out of your body through urination. Imaging tests, such as a CT scan, abdominal X-ray, or ultrasound. A procedure to examine the inside of the bladder (cystoscopy). How is this treated? Treatment for kidney stones depends on the size, location, and makeup of the stones. Kidney stones will often pass out of the body through urination. You may need to: Increase your fluid intake to help pass the stone. In some cases, you may be given fluids through an IV and may need to be monitored in the hospital. Take medicine for pain. Make changes in your diet to help prevent kidney stones from coming back. Sometimes, procedures are needed to remove a kidney stone. This may involve: A procedure to break up kidney stones using: A focused beam of light (laser therapy). Shock waves (extracorporeal shock wave lithotripsy). Surgery to remove kidney stones. This may be needed if you have severe pain or have stones that block your urinary tract. Follow these instructions at home: Medicines Take over-the-counter and prescription medicines only as told by your health care provider. Ask your health care provider if the medicine prescribed to you requires you to avoid driving or using heavy machinery. Eating and drinking Drink enough fluid to keep your urine pale yellow. You may be instructed to drink at least 8-10 glasses of water each day. This will help you pass the kidney stone. If directed, change your diet. This may include: Limiting how much sodium you eat. Eating more fruits  and vegetables. Limiting how much animal protein you eat. Animal proteins include red meat, poultry, fish, and eggs. Eating a normal amount of calcium (1,000-1,300 mg per day). Follow instructions from your health care provider about eating or drinking restrictions. General instructions Collect urine samples as  told by your health care provider. You may need to collect a urine sample: 24 hours after you pass the stone. 8-12 weeks after you pass the kidney stone, and every 6-12 months after that. Strain your urine every time you urinate, for as long as directed. Use the strainer that your health care provider recommends. Do not throw out the kidney stone after passing it. Keep the stone so it can be tested by your health care provider. Testing the makeup of your kidney stone may help prevent you from getting kidney stones in the future. Keep all follow-up visits. You may need follow-up X-rays or ultrasounds to make sure that your stone has passed. How is this prevented? To prevent another kidney stone: Drink enough fluid to keep your urine pale yellow. This is the best way to prevent kidney stones. Eat a healthy diet. Follow recommendations from your health care provider about foods to avoid. Recommendations vary depending on the type of kidney stone that you have. You may be instructed to eat a low-protein diet. Maintain a healthy weight. Where to find more information [place] [facility]): www.kidney.[facility] [facility]): www.urologyhealth.org Contact a health care provider if: You have pain that gets worse or does not get better with medicine. Get help right away if: You have a fever or chills. You develop severe pain. You develop new abdominal pain. You faint. You are unable to urinate. Summary Kidney stones are solid, rock-like deposits that form inside of the kidneys. Kidney stones can cause nausea, vomiting, blood in the urine, abdominal pain, and the urge to urinate often. Treatment for kidney stones depends on the size, location, and makeup of the stones. Kidney stones will often pass out of the body through urination. Kidney stones can be prevented by drinking enough fluids, eating a healthy diet, and maintaining a healthy weight. This information is not intended  to replace advice given to you by your health care provider. Make sure you discuss any questions you have with your health care provider. Document Revised: 03/07/2022 Document Reviewed: 03/07/2022 Elsevier Patient Education  [place].

## 2023-02-09 LAB — URINALYSIS, ROUTINE W REFLEX MICROSCOPIC
Bilirubin Urine: NEGATIVE
Glucose, UA: NEGATIVE
Hgb urine dipstick: NEGATIVE
Ketones, ur: NEGATIVE
Leukocytes,Ua: NEGATIVE
Nitrite: NEGATIVE
Protein, ur: NEGATIVE
Specific Gravity, Urine: 1.013 (ref 1.001–1.035)
pH: 5.5 (ref 5.0–8.0)

## 2023-02-09 LAB — URINE CULTURE
MICRO NUMBER:: 14633118
Result:: NO GROWTH
SPECIMEN QUALITY:: ADEQUATE

## 2023-02-18 ENCOUNTER — Other Ambulatory Visit: Payer: Self-pay

## 2023-02-18 MED ORDER — "INSULIN SYRINGE-NEEDLE U-100 31G X 5/16"" 0.5 ML MISC": 3 refills | Status: DC

## 2023-02-23 ENCOUNTER — Other Ambulatory Visit: Payer: Self-pay | Admitting: Nurse Practitioner

## 2023-02-25 ENCOUNTER — Ambulatory Visit: Payer: BC Managed Care – PPO

## 2023-02-25 VITALS — HR 83 | Temp 97.9°F | Resp 17 | Ht 70.0 in | Wt 243.2 lb

## 2023-02-25 DIAGNOSIS — E349 Endocrine disorder, unspecified: Secondary | ICD-10-CM

## 2023-02-25 MED ORDER — TESTOSTERONE CYPIONATE 200 MG/ML IM SOLN
200.0000 mg | Freq: Once | INTRAMUSCULAR | Status: AC
  Administered 2023-02-25: 200 mg via INTRAMUSCULAR

## 2023-02-25 NOTE — Progress Notes (Signed)
Patient here for a Testosterone injection 100mg/1ml IM injection in LEFT upper outer quadrant. Patient tolerated well and will return in 14 days for next injection.  

## 2023-03-08 ENCOUNTER — Ambulatory Visit: Payer: BC Managed Care – PPO | Admitting: Nurse Practitioner

## 2023-03-11 ENCOUNTER — Ambulatory Visit: Payer: BC Managed Care – PPO | Admitting: Nurse Practitioner

## 2023-03-11 ENCOUNTER — Ambulatory Visit: Payer: BC Managed Care – PPO

## 2023-03-12 ENCOUNTER — Ambulatory Visit (INDEPENDENT_AMBULATORY_CARE_PROVIDER_SITE_OTHER): Payer: BC Managed Care – PPO

## 2023-03-12 VITALS — BP 118/76 | HR 97 | Temp 97.9°F | Resp 16 | Ht 70.0 in | Wt 244.4 lb

## 2023-03-12 DIAGNOSIS — E349 Endocrine disorder, unspecified: Secondary | ICD-10-CM | POA: Diagnosis not present

## 2023-03-12 MED ORDER — TESTOSTERONE CYPIONATE 200 MG/ML IM SOLN
200.0000 mg | Freq: Once | INTRAMUSCULAR | Status: AC
  Administered 2023-03-12: 200 mg via INTRAMUSCULAR

## 2023-03-12 NOTE — Progress Notes (Signed)
Patient here for a Testosterone injection 200mg /60ml IM injection in RIGHT upper outer quadrant. Patient tolerated well and will return in 14 days for next injection.

## 2023-03-13 LAB — HM DIABETES EYE EXAM

## 2023-03-19 ENCOUNTER — Encounter: Payer: Self-pay | Admitting: Internal Medicine

## 2023-03-25 ENCOUNTER — Other Ambulatory Visit: Payer: Self-pay | Admitting: Nurse Practitioner

## 2023-03-25 ENCOUNTER — Ambulatory Visit: Payer: BC Managed Care – PPO

## 2023-03-25 ENCOUNTER — Encounter: Payer: Self-pay | Admitting: Nurse Practitioner

## 2023-03-25 ENCOUNTER — Ambulatory Visit: Payer: BC Managed Care – PPO | Admitting: Nurse Practitioner

## 2023-03-25 VITALS — BP 130/70 | HR 76 | Temp 98.0°F | Resp 16 | Ht 70.0 in | Wt 246.0 lb

## 2023-03-25 DIAGNOSIS — N182 Chronic kidney disease, stage 2 (mild): Secondary | ICD-10-CM

## 2023-03-25 DIAGNOSIS — E1122 Type 2 diabetes mellitus with diabetic chronic kidney disease: Secondary | ICD-10-CM | POA: Diagnosis not present

## 2023-03-25 DIAGNOSIS — I708 Atherosclerosis of other arteries: Secondary | ICD-10-CM

## 2023-03-25 DIAGNOSIS — N521 Erectile dysfunction due to diseases classified elsewhere: Secondary | ICD-10-CM

## 2023-03-25 DIAGNOSIS — G4733 Obstructive sleep apnea (adult) (pediatric): Secondary | ICD-10-CM

## 2023-03-25 DIAGNOSIS — Z79899 Other long term (current) drug therapy: Secondary | ICD-10-CM

## 2023-03-25 DIAGNOSIS — E1165 Type 2 diabetes mellitus with hyperglycemia: Secondary | ICD-10-CM

## 2023-03-25 DIAGNOSIS — I10 Essential (primary) hypertension: Secondary | ICD-10-CM

## 2023-03-25 DIAGNOSIS — E349 Endocrine disorder, unspecified: Secondary | ICD-10-CM

## 2023-03-25 DIAGNOSIS — E785 Hyperlipidemia, unspecified: Secondary | ICD-10-CM

## 2023-03-25 DIAGNOSIS — Z794 Long term (current) use of insulin: Secondary | ICD-10-CM

## 2023-03-25 DIAGNOSIS — M25561 Pain in right knee: Secondary | ICD-10-CM

## 2023-03-25 DIAGNOSIS — E1169 Type 2 diabetes mellitus with other specified complication: Secondary | ICD-10-CM

## 2023-03-25 DIAGNOSIS — K219 Gastro-esophageal reflux disease without esophagitis: Secondary | ICD-10-CM

## 2023-03-25 DIAGNOSIS — I700 Atherosclerosis of aorta: Secondary | ICD-10-CM | POA: Diagnosis not present

## 2023-03-25 DIAGNOSIS — E1159 Type 2 diabetes mellitus with other circulatory complications: Secondary | ICD-10-CM | POA: Diagnosis not present

## 2023-03-25 DIAGNOSIS — E559 Vitamin D deficiency, unspecified: Secondary | ICD-10-CM

## 2023-03-25 DIAGNOSIS — Z634 Disappearance and death of family member: Secondary | ICD-10-CM

## 2023-03-25 DIAGNOSIS — M238X1 Other internal derangements of right knee: Secondary | ICD-10-CM

## 2023-03-25 MED ORDER — DICLOFENAC SODIUM 2 % EX SOLN
2.0000 | Freq: Two times a day (BID) | CUTANEOUS | 1 refills | Status: DC | PRN
Start: 2023-03-25 — End: 2023-03-29

## 2023-03-25 MED ORDER — TESTOSTERONE CYPIONATE 200 MG/ML IM SOLN
200.0000 mg | Freq: Once | INTRAMUSCULAR | Status: AC
  Administered 2023-03-25: 200 mg via INTRAMUSCULAR

## 2023-03-25 MED ORDER — "INSULIN SYRINGE-NEEDLE U-100 31G X 5/16"" 0.5 ML MISC": 3 refills | Status: AC

## 2023-03-25 NOTE — Progress Notes (Signed)
3 MONTH FOLLOW UP  Assessment and Plan:  Atherosclerosis of aortic bifurcation and common iliac arteries (HCC) Per MRI 2021 Implement lifestyle  modifications. Control blood pressure, cholesterol, glucose, increase exercise.   Essential hypertension Continue Amlodipine & Telmisartan & Indapamide Discussed DASH (Dietary Approaches to Stop Hypertension) DASH diet is lower in sodium than a typical American diet. Cut back on foods that are high in saturated fat, cholesterol, and trans fats. Eat more whole-grain foods, fish, poultry, and nuts Remain active and exercise as tolerated daily.  Monitor BP at home-Call if greater than 130/80.  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 Recommended diet heavy in fruits and veggies, omega 3's. Decrease consumption of animal meats, cheeses, and dairy products. Remain active and exercise as tolerated. Continue to monitor. Check lipids/TSH   Uncontrolled type 2 diabetes mellitus with stage 2 chronic kidney disease, with long-term current use of insulin (HCC) Continue Libra Review of AGP report for 05/2022 for Russell [name] reveals average glucose of 145. Revuie of AGP report for 7/8/-7/21 for Russell [name] reveals average glucose of 138 Continue Mounjaro 10 mg, Metformin, Insulin 70/30 PRN due to BG readings.  Discussed general issues about diabetes pathophysiology and management., Educational material distributed., Suggested low cholesterol diet., Encouraged aerobic  exercise., Discussed foot care., Reminded to get yearly retinal exam.  CKD stage 2 due to type 2 diabetes mellitus (HCC) Continue bASA, statin Discussed how what you eat and drink can aide in kidney protection. Stay well hydrated. Avoid high salt foods. Avoid NSAIDS. Keep BP and BG well controlled.   Take medications as prescribed. Remain active and exercise as tolerated daily. Maintain weight.  Continue to monitor. Check CMP/GFR/Microablumin/UA   Morbid obesity (HCC) Down 13 lb Discussed appropriate BMI Goal of losing 1 lb per month. Diet modification. Physical activity. Encouraged/praised to build confidence.  Testosterone deficiency Continue Testosterone injections Monitor levels  Obstructive sleep apnea-questioned Continue CPAP, weight loss  Continue to follow with Pulmonology  Medication Management All medications discussed and reviewed in full. All questions and concerns regarding medications addressed.    GERD Start Pantoprazole. No suspected reflux complications (Barret/stricture). Lifestyle modification:  wt loss, avoid meals 2-3h before bedtime. Consider eliminating food triggers:  chocolate, caffeine, EtOH, acid/spicy food. Continue to follow with GI.  Death of a child Managing well - defers grief counseling Grief support offered in clinic. Continue support system/church Continue to monitor  Right knee pain/crepitus Possible meniscus tear Continue Diclofenac cream - will increase potency to 2%. RICE Method Brace support  Continue to monitor  Orders Placed This Encounter  Procedures   CBC with Differential/Platelet   COMPLETE METABOLIC PANEL WITH GFR   Lipid panel   Hemoglobin A1c   VITAMIN D 25 Hydroxy (Vit-D Deficiency, Fractures)   Notify office for further evaluation and treatment, questions or concerns if any reported s/s fail to improve.   The patient was advised to call back or seek an in-person evaluation if any symptoms worsen or if  the condition fails to improve as anticipated.   Further disposition pending results of labs. Discussed med's effects and SE's.    I discussed the  assessment and treatment plan with the patient. The patient was provided an opportunity to ask questions and all were answered. The patient agreed with the plan and demonstrated an understanding of the instructions.  Discussed med's effects and SE's. Screening labs and tests as requested with regular follow-up as recommended.  I provided 30 minutes of face-to-face time during this encounter including counseling, chart review, and critical decision making was preformed.  Today's Plan of Care is based on a patient-centered health care approach known as shared decision making - the decisions, tests and treatments allow for patient preferences and values to be balanced with clinical evidence.      Future Appointments  Date Time Provider Department Center  04/08/2023  8:45 AM [facility] NURSE [facility] None  04/22/2023  8:00 AM Lomax, Amy, NP [facility] None  06/25/2023  9:00 AM Haiden Rawlinson, Archie Patten, NP [facility] None     HPI 62 y.o. male patient presents for a general follow up. 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  Shares today that he recently lost his youngest daughter (age 85) to suicide. She resided in the home with patient and wife.  She contacted 911 after ingesting what patient states was a  90-day supply of his BP medication.  EMS was able to transport her to [facility] where she passed away two days later.  He also states that his oldest daughter during that time had a miscarriage. He is continuing to feel as though his mood is stable.  He is grieving "in his own way" by staying busy and keeping his mind focused on his grandchildren and working.  He reports a strong support system with wife, family and church.    He is concerned today for right knee pain.  Most notably when he sleeps.  Feels as though pain in centered on the lateral condyle of the right knee.  Tender to touch at times. Pain is intermittent.  Notices positive laxity throughout the day.  Has used Voltaren cream OTC before with some effectiveness.  Has a knee brace but has not been wearing.  Denies any recent trauma, fall, injury however has been working more and been more active with grand kids since daughter's passing.    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 Continues to help in the community.   Severe OSA per sleep study in 2014, he has a new CPAP,   He is continuing to manage GERD symptoms.  Has been on Mounjaro for 4  months.  Has taken Prevacid OTC and Zantac with some improvement.  His continuing to lose weight.  He is very pleased with his weight loss.  BMI is Body mass index is 35.3 kg/m., he has been working on diet and exercise. 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   He is on bASA every other day due to history of retroperitoneal hematoma    His blood pressure has been controlled at home, today their BP is BP: 130/70  He does not workout but tried to get 5000 steps a day.  He denies chest  pain, shortness of breath, dizziness.   He has been working on diet and exercise for Diabetes. He is now taking Mounjuaro, after trying Ozempic which failed.  He is on 70/30 insulin he is taking variable, 10-12 units in am depending on glucose reading.  He continues with Metformin 500mg , two tablets twice a day  he has continuous glucose monitor Russell [name] which is helping him to better control his glucose.  Last A1C was:  Lab Results  Component Value Date   HGBA1C 6.7 (H) 11/29/2022   Follows with Dr. Kathrene Bongo due to hx of periphephretic hematoma following stone. Last GFR: Lab Results  Component Value Date   GFRAA 77 05/17/2021   Lab Results  Component Value Date   CHOL 84 11/29/2022   HDL 29 (L) 11/29/2022   LDLCALC 40 11/29/2022   TRIG 69 11/29/2022   CHOLHDL 2.9 11/29/2022   Patient is on Vitamin D supplement.   Lab Results  Component Value Date   VD25OH 56 08/29/2022    He has a history of testosterone deficiency and is on testosterone replacement. He is prescribed 400 mg q2w, but hasn't been keeping up with this, last injection was 02/23/2021. He is requesting to restart today. Marland KitchenHe states that the testosterone helps with his energy.  Lab Results  Component Value Date   TESTOSTERONE 230 (L) 08/29/2022   Reports seasonal allergies with sneezing, watery eyes, nasal congestion.  Worse around noon and this time of year when seasons change  He does not take an OTC antihistamine.  Current Medications:    Current Outpatient Medications (Endocrine & Metabolic):    MOUNJARO 10 MG/0.5ML Pen, INJECT 10 MG INTO THE SKIN ONCE A WEEK   NOVOLIN 70/30 (70-30) 100 UNIT/ML injection, INJECT UP TO 50 UNITS 2 X /DAY WITH MEALS FOR DIABETES   testosterone cypionate (DEPOTESTOSTERONE CYPIONATE) 200 MG/ML injection, Inject 1 mL (200 mg total) into the muscle every 14 (fourteen) days.   metFORMIN (GLUCOPHAGE-XR) 500 MG 24 hr tablet, TAKE 2 TABLETS TWICE DAILY WITH MEALS FOR DIABETES (DX -  E11.9)  Current Outpatient Medications (Cardiovascular):    amLODipine (NORVASC) 10 MG tablet, TAKE 1 TABLET DAILY FOR BLOOD PRESSURE   atorvastatin (LIPITOR) 40 MG tablet, Take 1 tab daily for cholesterol.   bisoprolol (ZEBETA) 10 MG tablet, TAKE 1 TABLET DAILY FOR BP / PATIENT KNOWS TO TAKE BY MOUTH   ezetimibe (ZETIA) 10 MG tablet, TAKE 1 TABLET BY DAILY FOR CHOLESTEROL   indapamide (LOZOL) 1.25 MG tablet, Take 1.25 mg by mouth daily.   telmisartan (MICARDIS) 40 MG tablet, TAKE 1 TABLET DAILY FOR BLOOD PRESSURE & DIABETIC KIDNEY PROTECTION  Current Outpatient Medications (Respiratory):    ciclesonide (OMNARIS) 50 MCG/ACT nasal spray, Place 2 sprays into both nostrils daily as needed for allergies.   Current Outpatient Medications (Analgesics):    acetaminophen (TYLENOL) 500 MG tablet, Take 500 mg by mouth every 6 (six) hours as needed.   aspirin EC 81 MG tablet, Take 81 mg by mouth daily. Swallow whole.   Current Outpatient Medications (Other):    AMBULATORY NON FORMULARY MEDICATION, Diltiazem gel with 2% lidocaine  Apply a pea sized amount into your rectum three  times daily Dispense 30 GM zero refill   AMBULATORY NON FORMULARY MEDICATION, Medication Name: Diltiazem with 2% Lidocaine compound  Apply to rectum 3 times daily as needed   clotrimazole-betamethasone (LOTRISONE) cream, APPLY 3 TIMES A DAY AS NEEDED   Continuous Blood Gluc Sensor (FREESTYLE LIBRE 2 SENSOR) MISC, USE TO CHECK BLOOD SUGAR DAILY   Continuous Blood Gluc Sensor MISC, 1 each by Does not apply route as directed. Use as directed every 10 days. May dispense FreeStyle [employer] or similar.   glucose blood (FREESTYLE LITE) test strip, Check blood sugar 3 times a day or as directed. DX-E11.22   Insulin Syringe-Needle U-100 (TRUEPLUS INSULIN SYRINGE) 31G X 5/16" 0.5 ML MISC, USE TO INJECT INSULIN TWICE DAILY AS DIRECTED   ketoconazole (NIZORAL) 2 % shampoo, APPLY 1 APPLICATION TOPICALLY 2 (TWO) TIMES A WEEK. APPLY  & SHAMPOO 2 X /WEEK   methylcellulose (CITRUCEL) oral powder, Take 1 packet by mouth daily.   pantoprazole (PROTONIX) 20 MG tablet, TAKE 1 TABLET(20 MG) BY MOUTH DAILY   senna-docusate (SENOKOT-S) 8.6-50 MG tablet, Take 1 tablet by mouth every other day.   tamsulosin (FLOMAX) 0.4 MG CAPS capsule, Take 0.4 mg by mouth daily.   Allergies:  Allergies  Allergen Reactions   Other     Ppd Black Rubber Mix   Ppd [Tuberculin Purified Protein Derivative] Other (See Comments)    POSITIVE REACTION IN PAST 11/21/11 CXR NEG   Health Maintenance:  Immunization History  Administered Date(s) Administered   Influenza Split 10/10/2013   Influenza Whole 09/08/2012   Influenza-Unspecified 09/22/2018, 09/11/2019, 09/18/2019   Moderna Covid-19 Vaccine Bivalent Booster 47yrs & up 10/02/2021   Moderna SARS-COV2 Booster Vaccination 05/26/2021   Moderna Sars-Covid-2 Vaccination 02/01/2020, 03/01/2020   Pneumococcal-Unspecified 12/31/2011   Tdap 12/19/2009, 04/08/2020    Patient Care Team: Lucky Cowboy, MD as PCP - General (Internal Medicine)  Medical History:  has Nonallergic rhinitis; Obstructive sleep apnea-questioned; Hypertension; Hyperlipidemia associated with type 2 diabetes mellitus; Poorly controlled type 2 diabetes mellitus; [name]; Testosterone deficiency; Morbid obesity; Poor compliance with diet; Medication management; 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   Surgical History:  He  has a past surgical history that includes Wisdom tooth extraction; Hernia repair; Colonoscopy; and Rotator cuff repair (Left, 2017 sept 5).   Family History:  His family history includes Cancer (age of onset: 63) in his  father; Diabetes in his brother, father, and mother; Diabetes Mellitus II in his paternal grandfather; Hyperlipidemia in his father and mother; Hypertension in his brother, father, and mother; Myelodysplastic syndrome in his mother; Sleep apnea in his mother.   Social History:   reports that he quit smoking about 22 years ago. His smoking use included cigarettes. He has a 2.40 pack-year smoking history. He has never used smokeless tobacco. He reports current alcohol use. He reports that he does not use drugs.   Review of Systems:  Review of Systems  Constitutional: Negative.  Negative for malaise/fatigue and weight loss.  HENT: Negative.  Negative for hearing loss and tinnitus.   Eyes: Negative.  Negative for blurred vision and double vision.  Respiratory: Negative.  Negative for cough, shortness of breath and wheezing.   Cardiovascular: Negative.  Negative for chest pain, palpitations, orthopnea, claudication  and leg swelling.  Gastrointestinal: Negative.  Negative for abdominal pain, blood in stool, constipation, diarrhea, heartburn, melena, nausea and vomiting.  Genitourinary: Negative.   Musculoskeletal: Negative.  Negative for joint pain and myalgias.  Skin: Negative.  Negative for rash.  Neurological: Negative.  Negative for dizziness, tingling, sensory change, weakness and headaches.  Endo/Heme/Allergies: Negative.  Negative for polydipsia.  Psychiatric/Behavioral: Negative.    All other systems reviewed and are negative.   Physical Exam: Estimated body mass index is 35.3 kg/m as calculated from the following:   Height as of this encounter: 5\' 10"  (1.778 m).   Weight as of this encounter: 246 lb (111.6 kg). BP 130/70   Pulse 76   Temp 98 F (36.7 C)   Resp 16   Ht 5\' 10"  (1.778 m)   Wt 246 lb (111.6 kg)   SpO2 99%   BMI 35.30 kg/m  General Appearance: Well nourished, well dressed obese AA adult male in no apparent distress.  Eyes: PERRLA, EOMs, conjunctiva no swelling or  erythema Sinuses: No Frontal/maxillary tenderness  ENT/Mouth: Ext aud canals clear, normal light reflex with TMs without erythema, bulging. Good dentition. No erythema, swelling, or exudate on post pharynx. Tonsils not swollen or erythematous. Hearing normal.  Neck: Supple, thyroid normal. No bruits  Respiratory: Respiratory effort normal, BS equal bilaterally without rales, rhonchi, wheezing or stridor.  Cardio: RRR without murmurs, prominent S2, no rubs or gallops. Brisk peripheral pulses without edema.  Chest: symmetric, with normal excursions and percussion.  Abdomen: Soft,obese abdomen, nontender, no guarding, rebound, hernias, masses, or organomegaly.  Lymphatics: Tender right upper anterior cervical chain with single palpable lymph node; otherwise without lymphadenopathy.  Genitourinary: declines, doing self exams, no concerns, urology follows Musculoskeletal: Full ROM all peripheral extremities,5/5 strength, and normal gait.  Skin: Warm, dry without rashes, lesions, ecchymosis. Neuro: Cranial nerves intact, reflexes equal bilaterally. Normal muscle tone, no cerebellar symptoms. Sensation intact to monofilament bil feet.  Psych: Awake and oriented X 3, normal affect, Insight and Judgment appropriate.    Nickie Warwick 11:17 AM [facility]

## 2023-03-25 NOTE — Progress Notes (Signed)
Patient here for a Testosterone injection 200mg /53ml IM injection in LEFT upper outer quadrant. Patient tolerated well and will return in 14 days for next injection.

## 2023-03-25 NOTE — Patient Instructions (Signed)
Meniscus Tear  A meniscus tear is a knee injury that happens when a piece of the meniscus is torn. The meniscus is a thick, rubbery, wedge-shaped piece of cartilage in the knee. Each knee has two menisci sitting between the upper bone (femur) and lower bone (tibia) that form the knee joint. Each meniscus acts as a shock absorber for the knee. A torn meniscus is a common knee injury, ranging from mild to severe. Surgery may be needed to repair a severe tear. What are the causes? This condition may be caused by kneeling, squatting, twisting, or pivoting movements. Sports-related injuries are the most common cause, often resulting from: Running and stopping suddenly. Changing direction. Being tackled or knocked off your feet. Lifting or carrying heavy weights. As people get older, their menisci get thinner and weaker. Tears can happen more easily in older people, for example, when climbing stairs. What increases the risk? You are more likely to develop this condition if you: Play contact sports. Have a job that requires kneeling or squatting. Are male. Are over 40 years old. What are the signs or symptoms? Symptoms of this condition include: Knee pain, especially at the side of the knee joint. You may feel pain immediately after injury, or hear a pop and feel pain later. A feeling that your knee is clicking, catching, locking, or giving way (weakness, instability). Not being able to fully bend or extend your knee. Bruising or swelling in your knee. How is this diagnosed? This condition may be diagnosed based on your symptoms and a physical exam. You may also have tests, such as: X-rays. MRI. Arthroscopy. This is a procedure to look inside your knee with a narrow surgical telescope. You may be referred to a knee specialist (orthopedic surgeon). How is this treated? Treatment for this injury depends on the severity of the tear. Treatment for a mild tear may include: Rest. Medicine to  reduce pain and swelling, usually a nonsteroidal anti-inflammatory drug (NSAID), like ibuprofen. A knee brace, sleeve, or wrap. Using crutches or a walker to keep weight off your knee and help with walking. Exercises to strengthen your knee (physical therapy). You may need surgery if you have a severe tear or if other treatments fail. Follow these instructions at home: If you have a brace, sleeve, or wrap: Wear it, as told by your health care provider. Remove it, only as told by your health care provider. Loosen the brace, sleeve, or wrap if your toes tingle, become numb, or turn cold and blue. Keep the brace, sleeve, or wrap clean. If the brace, sleeve, or wrap is not waterproof: Do not let it get wet. Cover it with a watertight covering when you take a bath or shower. Managing pain, stiffness, and swelling  Take over-the-counter and prescription medicines only as told by your health care provider. If directed, put ice on your knee. To do this: If you have a removable brace, sleeve, or wrap, remove it as told by your health care provider. Put ice in a plastic bag. Place a towel between your skin and the bag. Leave the ice on for 20 minutes, 2-3 times per day. Remove the ice if your skin turns bright red. This is very important. If you cannot feel pain, heat, or cold, you have a greater risk of damage to the area. Move your toes often to reduce stiffness and swelling. Raise (elevate) the injured area above the level of your heart while you are sitting or lying down. Activity Do not   use the injured limb to support your body weight until your health care provider says that you can. Use crutches or a walker as told by your health care provider. Return to your normal activities as told by your health care provider. Ask your health care provider what activities are safe for you. Perform range-of-motion exercises only as told by your health care provider. Begin doing exercises to strengthen  your knee and leg muscles only as told by your health care provider. After you recover, your health care provider may recommend these exercises to help prevent another injury. General instructions Use a knee brace, sleeve, or wrap as told by your health care provider. Ask your health care provider when it is safe to drive if you have a brace, sleeve, or wrap on your knee. Do not use any products that contain nicotine or tobacco, such as cigarettes, e-cigarettes, and chewing tobacco. If you need help quitting, ask your health care provider. Ask your health care provider if the medicine prescribed to you: Requires you to avoid driving or using heavy machinery. Can cause constipation. You may need to take these actions to prevent or treat constipation: Drink enough fluid to keep your urine pale yellow. Take over-the-counter or prescription medicines. Eat foods that are high in fiber, such as beans, whole grains, and fresh fruits and vegetables. Limit foods that are high in fat and processed sugars, such as fried or sweet foods. Keep all follow-up visits. This is important. Contact a health care provider if: You have a fever. Your knee becomes red, tender, or swollen. Your pain medicine is not controlling your pain. Your symptoms get worse or do not improve after 2 weeks of home care. Summary A meniscus tear is a knee injury that happens when a piece of the meniscus is torn. Treatment for this injury depends on the severity of the tear. You may need surgery if you have a severe tear or if other treatments fail. Rest, ice, and raise (elevate) your injured knee, as told by your health care provider. This will help lessen pain and swelling. Contact a health care provider if you have new symptoms, your symptoms worsen, or they do not improve after 2 weeks of home care. Keep all follow-up visits. This is important. This information is not intended to replace advice given to you by your health care  provider. Make sure you discuss any questions you have with your health care provider. Document Revised: 04/07/2020 Document Reviewed: 04/07/2020 Elsevier Patient Education  2023 [employer]  

## 2023-03-26 LAB — COMPLETE METABOLIC PANEL WITH GFR
AG Ratio: 1.5 (calc) (ref 1.0–2.5)
ALT: 28 U/L (ref 9–46)
AST: 19 U/L (ref 10–35)
Albumin: 4.3 g/dL (ref 3.6–5.1)
Alkaline phosphatase (APISO): 52 U/L (ref 35–144)
BUN: 16 mg/dL (ref 7–25)
CO2: 26 mmol/L (ref 20–32)
Calcium: 9.7 mg/dL (ref 8.6–10.3)
Chloride: 104 mmol/L (ref 98–110)
Creat: 1.22 mg/dL (ref 0.70–1.35)
Globulin: 2.9 g/dL (calc) (ref 1.9–3.7)
Glucose, Bld: 113 mg/dL — ABNORMAL HIGH (ref 65–99)
Potassium: 4.4 mmol/L (ref 3.5–5.3)
Sodium: 140 mmol/L (ref 135–146)
Total Bilirubin: 0.8 mg/dL (ref 0.2–1.2)
Total Protein: 7.2 g/dL (ref 6.1–8.1)
eGFR: 68 mL/min/{1.73_m2} (ref >=60)

## 2023-03-26 LAB — CBC WITH DIFFERENTIAL/PLATELET
Absolute Monocytes: 843 cells/uL (ref 200–950)
Basophils Absolute: 37 cells/uL (ref 0–200)
Basophils Relative: 0.6 %
Eosinophils Absolute: 161 cells/uL (ref 15–500)
Eosinophils Relative: 2.6 %
HCT: 50.1 % — ABNORMAL HIGH (ref 38.5–50.0)
Hemoglobin: 16.1 g/dL (ref 13.2–17.1)
Lymphs Abs: 2201 cells/uL (ref 850–3900)
MCH: 27.3 pg (ref 27.0–33.0)
MCHC: 32.1 g/dL (ref 32.0–36.0)
MCV: 85.1 fL (ref 80.0–100.0)
MPV: 9.4 fL (ref 7.5–12.5)
Monocytes Relative: 13.6 %
Neutro Abs: 2957 cells/uL (ref 1500–7800)
Neutrophils Relative %: 47.7 %
Platelets: 309 10*3/uL (ref 140–400)
RBC: 5.89 10*6/uL — ABNORMAL HIGH (ref 4.20–5.80)
RDW: 15.1 % — ABNORMAL HIGH (ref 11.0–15.0)
Total Lymphocyte: 35.5 %
WBC: 6.2 10*3/uL (ref 3.8–10.8)

## 2023-03-26 LAB — LIPID PANEL
Cholesterol: 103 mg/dL (ref <200)
HDL: 32 mg/dL — ABNORMAL LOW (ref >=40)
LDL Cholesterol (Calc): 58 mg/dL (calc)
Non-HDL Cholesterol (Calc): 71 mg/dL (calc) (ref <130)
Total CHOL/HDL Ratio: 3.2 (calc) (ref <5.0)
Triglycerides: 57 mg/dL (ref <150)

## 2023-03-26 LAB — HEMOGLOBIN A1C
Hgb A1c MFr Bld: 6.5 % of total Hgb — ABNORMAL HIGH (ref <5.7)
Mean Plasma Glucose: 140 mg/dL
eAG (mmol/L): 7.7 mmol/L

## 2023-03-26 LAB — VITAMIN D 25 HYDROXY (VIT D DEFICIENCY, FRACTURES): Vit D, 25-Hydroxy: 55 ng/mL (ref 30–100)

## 2023-03-29 ENCOUNTER — Other Ambulatory Visit: Payer: Self-pay | Admitting: Nurse Practitioner

## 2023-03-29 DIAGNOSIS — M238X1 Other internal derangements of right knee: Secondary | ICD-10-CM

## 2023-03-29 DIAGNOSIS — M25561 Pain in right knee: Secondary | ICD-10-CM

## 2023-03-29 MED ORDER — DICLOFENAC SODIUM 2 % EX SOLN
2.0000 | Freq: Two times a day (BID) | CUTANEOUS | 1 refills | Status: DC | PRN
Start: 2023-03-29 — End: 2023-07-14

## 2023-04-08 ENCOUNTER — Ambulatory Visit (INDEPENDENT_AMBULATORY_CARE_PROVIDER_SITE_OTHER): Payer: BC Managed Care – PPO

## 2023-04-08 ENCOUNTER — Encounter: Payer: Self-pay | Admitting: Internal Medicine

## 2023-04-08 VITALS — BP 108/72 | HR 76 | Temp 97.8°F | Ht 70.0 in | Wt 245.0 lb

## 2023-04-08 DIAGNOSIS — E349 Endocrine disorder, unspecified: Secondary | ICD-10-CM | POA: Diagnosis not present

## 2023-04-08 MED ORDER — TESTOSTERONE CYPIONATE 200 MG/ML IM SOLN
200.0000 mg | Freq: Once | INTRAMUSCULAR | Status: AC
Start: 2023-04-08 — End: 2023-04-08
  Administered 2023-04-08: 100 mg via INTRAMUSCULAR

## 2023-04-08 NOTE — Progress Notes (Signed)
Patient here for a Testosterone injection 200mg/1ml IM injection in LEFT upper outer quadrant. Patient tolerated well and will return in 14 days for next injection.               

## 2023-04-22 ENCOUNTER — Other Ambulatory Visit: Payer: Self-pay | Admitting: Nurse Practitioner

## 2023-04-22 ENCOUNTER — Ambulatory Visit: Payer: BC Managed Care – PPO | Admitting: Family Medicine

## 2023-04-22 ENCOUNTER — Ambulatory Visit: Payer: BC Managed Care – PPO | Admitting: Adult Health

## 2023-04-22 ENCOUNTER — Telehealth: Payer: Self-pay | Admitting: Nurse Practitioner

## 2023-04-22 DIAGNOSIS — E1122 Type 2 diabetes mellitus with diabetic chronic kidney disease: Secondary | ICD-10-CM

## 2023-04-22 DIAGNOSIS — E1165 Type 2 diabetes mellitus with hyperglycemia: Secondary | ICD-10-CM

## 2023-04-22 DIAGNOSIS — E1159 Type 2 diabetes mellitus with other circulatory complications: Secondary | ICD-10-CM

## 2023-04-22 DIAGNOSIS — I10 Essential (primary) hypertension: Secondary | ICD-10-CM

## 2023-04-22 DIAGNOSIS — E119 Type 2 diabetes mellitus without complications: Secondary | ICD-10-CM

## 2023-04-22 DIAGNOSIS — N183 Type 2 diabetes mellitus with diabetic chronic kidney disease: Secondary | ICD-10-CM

## 2023-04-22 MED ORDER — MOUNJARO 10 MG/0.5ML ~~LOC~~ SOAJ
SUBCUTANEOUS | 3 refills | Status: DC
Start: 2023-04-22 — End: 2023-05-20

## 2023-04-22 MED ORDER — TESTOSTERONE CYPIONATE 200 MG/ML IM SOLN: 200.0000 mg | INTRAMUSCULAR | 1 refills | Status: DC

## 2023-04-22 NOTE — Progress Notes (Signed)
PDMP is reviewed and appropriate  

## 2023-04-22 NOTE — Telephone Encounter (Signed)
Patient is requesting refills on Testosterone and Mounjaro to [facility] on [address].

## 2023-04-23 ENCOUNTER — Ambulatory Visit: Payer: BC Managed Care – PPO

## 2023-05-13 ENCOUNTER — Encounter: Payer: Self-pay | Admitting: Nurse Practitioner

## 2023-05-13 ENCOUNTER — Ambulatory Visit: Payer: BC Managed Care – PPO | Admitting: Nurse Practitioner

## 2023-05-13 VITALS — BP 128/72 | HR 81 | Temp 97.3°F | Ht 70.0 in | Wt 241.2 lb

## 2023-05-13 DIAGNOSIS — H6691 Otitis media, unspecified, right ear: Secondary | ICD-10-CM | POA: Diagnosis not present

## 2023-05-13 DIAGNOSIS — I10 Essential (primary) hypertension: Secondary | ICD-10-CM

## 2023-05-13 DIAGNOSIS — Z1152 Encounter for screening for COVID-19: Secondary | ICD-10-CM | POA: Diagnosis not present

## 2023-05-13 LAB — POC COVID19 BINAXNOW: SARS Coronavirus 2 Ag: NEGATIVE

## 2023-05-13 MED ORDER — DEXAMETHASONE 1 MG PO TABS
ORAL_TABLET | ORAL | 0 refills | Status: DC
Start: 2023-05-13 — End: 2023-07-14

## 2023-05-13 MED ORDER — AMOXICILLIN 500 MG PO TABS
500.0000 mg | ORAL_TABLET | Freq: Three times a day (TID) | ORAL | 0 refills | Status: DC
Start: 2023-05-13 — End: 2023-07-14

## 2023-05-13 NOTE — Patient Instructions (Signed)

## 2023-05-13 NOTE — Progress Notes (Signed)
Assessment and Plan:  Russell [name] was seen today for acute visit.  Diagnoses and all orders for this visit:  Encounter for screening for COVID-19 -     POC COVID-19- Negative -  Advised could still be too early to get a positive result and continue to monitor  Essential hypertension - continue medications, DASH diet, exercise and monitor at home. Call if greater than 130/80.   Acute otitis media, right Push fluids Continue Claritin If symptoms do not improve in the next 5-7 days notify the office -     dexamethasone (DECADRON) 1 MG tablet; Take 3 tabs for 3 days, 2 tabs for 3 days 1 tab for 5 days. Take with food. -     amoxicillin (AMOXIL) 500 MG tablet; Take 1 tablet (500 mg total) by mouth 3 (three) times daily. 10 days       Further disposition pending results of labs. Discussed med's effects and SE's.   Over 30 minutes of exam, counseling, chart review, and critical decision making was performed.   Future Appointments  Date Time Provider Department Center  06/25/2023  9:00 AM Adela Glimpse, NP [facility] None  07/16/2023 10:30 AM Lomax, Amy, NP [facility] None    ------------------------------------------------------------------------------------------------------------------   HPI BP 128/72   Pulse 81   Temp (!) 97.3 F (36.3 C)   Ht 5\' 10"  (1.778 m)   Wt 241 lb 3.2 oz (109.4 kg)   SpO2 97%   BMI 34.61 kg/m   62 y.o.male presents for complaints of congestion, dry cough, low grade fever and body aches.  Russell [name] tested positive for Covid yesterday. They just came back from a 10 day cruise to [place]   Currently controlled on bisoprolol 10 mg QD, amlodipine 10 mg QD, indapamide 1.25 mg QD and Telmisartan 40 mg QD.  Denies chest pain, shortness of breath and dizziness.  BP Readings from Last 3 Encounters:  05/13/23 128/72  04/08/23 108/72  03/25/23 130/70   BMI is Body mass index is 34.61 kg/m., he has been working on diet and exercise. Wt Readings from Last 3 Encounters:   05/13/23 241 lb 3.2 oz (109.4 kg)  04/08/23 245 lb (111.1 kg)  03/25/23 246 lb (111.6 kg)     Past Medical History:  Diagnosis Date   Allergic rhinitis    Allergy    Chronic sinusitis    Diabetes mellitus (HCC)    Hyperlipidemia    Hypertension    Hypogonadism male    Perinephric hematoma 07/16/2015     Allergies  Allergen Reactions   Other     Ppd Black Rubber Mix   Ppd [Tuberculin Purified Protein Derivative] Other (See Comments)    POSITIVE REACTION IN PAST 11/21/11 CXR NEG    Current Outpatient Medications on File Prior to Visit  Medication Sig   acetaminophen (TYLENOL) 500 MG tablet Take 500 mg by mouth every 6 (six) hours as needed.   AMBULATORY NON FORMULARY MEDICATION Diltiazem gel with 2% lidocaine  Apply a pea sized amount into your rectum three times daily Dispense 30 GM zero refill   AMBULATORY NON FORMULARY MEDICATION Medication Name: Diltiazem with 2% Lidocaine compound  Apply to rectum 3 times daily as needed   amLODipine (NORVASC) 10 MG tablet TAKE 1 TABLET DAILY FOR BLOOD PRESSURE   aspirin EC 81 MG tablet Take 81 mg by mouth daily. Swallow whole.   atorvastatin (LIPITOR) 40 MG tablet Take 1 tab daily for cholesterol.   bisoprolol (ZEBETA) 10 MG tablet TAKE 1 TABLET  DAILY FOR BP / PATIENT KNOWS TO TAKE BY MOUTH   ciclesonide (OMNARIS) 50 MCG/ACT nasal spray Place 2 sprays into both nostrils daily as needed for allergies.    clotrimazole-betamethasone (LOTRISONE) cream APPLY 3 TIMES A DAY AS NEEDED   Continuous Blood Gluc Sensor (FREESTYLE LIBRE 2 SENSOR) MISC USE TO CHECK BLOOD SUGAR DAILY   Continuous Blood Gluc Sensor MISC 1 each by Does not apply route as directed. Use as directed every 10 days. May dispense FreeStyle Libre Sensor System or similar.   ezetimibe (ZETIA) 10 MG tablet TAKE 1 TABLET BY DAILY FOR CHOLESTEROL   glucose blood (FREESTYLE LITE) test strip Check blood sugar 3 times a day or as directed. DX-E11.22   indapamide (LOZOL) 1.25 MG  tablet Take 1.25 mg by mouth daily.   Insulin Syringe-Needle U-100 (TRUEPLUS INSULIN SYRINGE) 31G X 5/16" 0.5 ML MISC USE TO INJECT INSULIN TWICE DAILY AS DIRECTED   ketoconazole (NIZORAL) 2 % shampoo APPLY 1 APPLICATION TOPICALLY 2 (TWO) TIMES A WEEK. APPLY & SHAMPOO 2 X /WEEK   metFORMIN (GLUCOPHAGE-XR) 500 MG 24 hr tablet TAKE 2 TABLETS TWICE DAILY WITH MEALS FOR DIABETES (DX - E11.9)   methylcellulose (CITRUCEL) oral powder Take 1 packet by mouth daily.   NOVOLIN 70/30 (70-30) 100 UNIT/ML injection INJECT UP TO 50 UNITS 2 X /DAY WITH MEALS FOR DIABETES   pantoprazole (PROTONIX) 20 MG tablet TAKE 1 TABLET(20 MG) BY MOUTH DAILY   senna-docusate (SENOKOT-S) 8.6-50 MG tablet Take 1 tablet by mouth every other day.   tamsulosin (FLOMAX) 0.4 MG CAPS capsule Take 0.4 mg by mouth daily.   telmisartan (MICARDIS) 40 MG tablet TAKE 1 TABLET DAILY FOR BLOOD PRESSURE & DIABETIC KIDNEY PROTECTION   testosterone cypionate (DEPOTESTOSTERONE CYPIONATE) 200 MG/ML injection Inject 1 mL (200 mg total) into the muscle every 14 (fourteen) days.   tirzepatide (MOUNJARO) 10 MG/0.5ML Pen ADMINISTER 10 MG UNDER THE SKIN 1 TIME A WEEK   diclofenac Sodium (PENNSAID) 2 % SOLN Apply 2 Pump (40 mg total) topically 2 (two) times daily as needed.   No current facility-administered medications on file prior to visit.    ROS: all negative except above.   Physical Exam:  BP 128/72   Pulse 81   Temp (!) 97.3 F (36.3 C)   Ht 5\' 10"  (1.778 m)   Wt 241 lb 3.2 oz (109.4 kg)   SpO2 97%   BMI 34.61 kg/m   General Appearance: Well nourished, in no apparent distress. Eyes: PERRLA, EOMs, conjunctiva no swelling or erythema Sinuses: No Frontal/maxillary tenderness ENT/Mouth: Ext aud canals clear, R TM erythematous and bulging. L TM no erythema or bulging. Mild erythema bur no exudate on post pharynx. Hearing normal.  Neck: Supple, thyroid normal.  Respiratory: Respiratory effort normal, BS equal bilaterally without rales,  rhonchi, wheezing or stridor.  Cardio: RRR with no MRGs. Brisk peripheral pulses without edema.  Abdomen: Soft, + BS.  Non tender, no guarding, rebound, hernias, masses. Lymphatics: Non tender without lymphadenopathy.  Musculoskeletal: Full ROM, 5/5 strength, normal gait.  Skin: Warm, dry without rashes, lesions, ecchymosis.   Psych: Awake and oriented X 3, normal affect, Insight and Judgment appropriate.     Raynelle Dick, NP 4:33 PM [facility]

## 2023-05-20 ENCOUNTER — Telehealth: Payer: Self-pay | Admitting: Nurse Practitioner

## 2023-05-20 ENCOUNTER — Encounter: Payer: BC Managed Care – PPO | Admitting: Nurse Practitioner

## 2023-05-20 ENCOUNTER — Other Ambulatory Visit: Payer: Self-pay | Admitting: Nurse Practitioner

## 2023-05-20 DIAGNOSIS — E1165 Type 2 diabetes mellitus with hyperglycemia: Secondary | ICD-10-CM

## 2023-05-20 MED ORDER — MOUNJARO 12.5 MG/0.5ML ~~LOC~~ SOAJ
12.5000 mg | SUBCUTANEOUS | 3 refills | Status: DC
Start: 2023-05-20 — End: 2023-07-09

## 2023-05-20 NOTE — Telephone Encounter (Signed)
I have sent over the 12.5 mg dose to [facility]

## 2023-05-20 NOTE — Telephone Encounter (Signed)
Pt. Is unable to get 10 MG Monjaro due to shortage at [facility] on [address]. Pt was wondering if he could increase dose to 12 MG since they have that dose available.

## 2023-06-10 ENCOUNTER — Other Ambulatory Visit: Payer: Self-pay

## 2023-06-10 DIAGNOSIS — E1169 Type 2 diabetes mellitus with other specified complication: Secondary | ICD-10-CM

## 2023-06-10 MED ORDER — ATORVASTATIN CALCIUM 40 MG PO TABS
ORAL_TABLET | ORAL | 3 refills | Status: AC
Start: 2023-06-10 — End: ?

## 2023-06-20 ENCOUNTER — Other Ambulatory Visit: Payer: Self-pay | Admitting: Nurse Practitioner

## 2023-06-20 DIAGNOSIS — Z794 Long term (current) use of insulin: Secondary | ICD-10-CM

## 2023-06-25 ENCOUNTER — Encounter: Payer: BC Managed Care – PPO | Admitting: Nurse Practitioner

## 2023-06-30 ENCOUNTER — Other Ambulatory Visit: Payer: Self-pay | Admitting: Nurse Practitioner

## 2023-06-30 DIAGNOSIS — I10 Essential (primary) hypertension: Secondary | ICD-10-CM

## 2023-07-09 ENCOUNTER — Ambulatory Visit (INDEPENDENT_AMBULATORY_CARE_PROVIDER_SITE_OTHER): Payer: BC Managed Care – PPO | Admitting: Nurse Practitioner

## 2023-07-09 ENCOUNTER — Encounter: Payer: Self-pay | Admitting: Nurse Practitioner

## 2023-07-09 VITALS — BP 108/70 | HR 71 | Temp 97.7°F | Ht 70.0 in | Wt 240.4 lb

## 2023-07-09 DIAGNOSIS — E349 Endocrine disorder, unspecified: Secondary | ICD-10-CM

## 2023-07-09 DIAGNOSIS — E1122 Type 2 diabetes mellitus with diabetic chronic kidney disease: Secondary | ICD-10-CM

## 2023-07-09 DIAGNOSIS — Z79899 Other long term (current) drug therapy: Secondary | ICD-10-CM | POA: Diagnosis not present

## 2023-07-09 DIAGNOSIS — I10 Essential (primary) hypertension: Secondary | ICD-10-CM

## 2023-07-09 DIAGNOSIS — E538 Deficiency of other specified B group vitamins: Secondary | ICD-10-CM

## 2023-07-09 DIAGNOSIS — I700 Atherosclerosis of aorta: Secondary | ICD-10-CM

## 2023-07-09 DIAGNOSIS — Z0000 Encounter for general adult medical examination without abnormal findings: Secondary | ICD-10-CM | POA: Diagnosis not present

## 2023-07-09 DIAGNOSIS — Z86010 Personal history of colonic polyps: Secondary | ICD-10-CM

## 2023-07-09 DIAGNOSIS — Z131 Encounter for screening for diabetes mellitus: Secondary | ICD-10-CM

## 2023-07-09 DIAGNOSIS — N401 Enlarged prostate with lower urinary tract symptoms: Secondary | ICD-10-CM | POA: Diagnosis not present

## 2023-07-09 DIAGNOSIS — E1165 Type 2 diabetes mellitus with hyperglycemia: Secondary | ICD-10-CM

## 2023-07-09 DIAGNOSIS — N200 Calculus of kidney: Secondary | ICD-10-CM

## 2023-07-09 DIAGNOSIS — Z1389 Encounter for screening for other disorder: Secondary | ICD-10-CM | POA: Diagnosis not present

## 2023-07-09 DIAGNOSIS — E559 Vitamin D deficiency, unspecified: Secondary | ICD-10-CM

## 2023-07-09 DIAGNOSIS — R350 Frequency of micturition: Secondary | ICD-10-CM

## 2023-07-09 DIAGNOSIS — Z0001 Encounter for general adult medical examination with abnormal findings: Secondary | ICD-10-CM

## 2023-07-09 DIAGNOSIS — Z130 Encounter for screening for diseases of the blood and blood-forming organs and certain disorders involving the immune mechanism: Secondary | ICD-10-CM

## 2023-07-09 DIAGNOSIS — Z125 Encounter for screening for malignant neoplasm of prostate: Secondary | ICD-10-CM

## 2023-07-09 DIAGNOSIS — J302 Other seasonal allergic rhinitis: Secondary | ICD-10-CM

## 2023-07-09 DIAGNOSIS — Z91199 Patient's noncompliance with other medical treatment and regimen due to unspecified reason: Secondary | ICD-10-CM

## 2023-07-09 DIAGNOSIS — G4733 Obstructive sleep apnea (adult) (pediatric): Secondary | ICD-10-CM

## 2023-07-09 DIAGNOSIS — Z13220 Encounter for screening for lipoid disorders: Secondary | ICD-10-CM | POA: Diagnosis not present

## 2023-07-09 DIAGNOSIS — E1169 Type 2 diabetes mellitus with other specified complication: Secondary | ICD-10-CM

## 2023-07-09 DIAGNOSIS — E1159 Type 2 diabetes mellitus with other circulatory complications: Secondary | ICD-10-CM

## 2023-07-09 LAB — CBC WITH DIFFERENTIAL/PLATELET
Absolute Monocytes: 812 cells/uL (ref 200–950)
Basophils Absolute: 28 cells/uL (ref 0–200)
Basophils Relative: 0.4 %
Eosinophils Absolute: 189 cells/uL (ref 15–500)
Eosinophils Relative: 2.7 %
HCT: 49.8 % (ref 38.5–50.0)
Hemoglobin: 16.2 g/dL (ref 13.2–17.1)
Lymphs Abs: 2891 cells/uL (ref 850–3900)
MCH: 26.3 pg — ABNORMAL LOW (ref 27.0–33.0)
MCHC: 32.5 g/dL (ref 32.0–36.0)
MCV: 80.7 fL (ref 80.0–100.0)
MPV: 10 fL (ref 7.5–12.5)
Monocytes Relative: 11.6 %
Neutro Abs: 3080 cells/uL (ref 1500–7800)
Neutrophils Relative %: 44 %
Platelets: 260 10*3/uL (ref 140–400)
RBC: 6.17 10*6/uL — ABNORMAL HIGH (ref 4.20–5.80)
RDW: 14.8 % (ref 11.0–15.0)
Total Lymphocyte: 41.3 %
WBC: 7 10*3/uL (ref 3.8–10.8)

## 2023-07-09 MED ORDER — TESTOSTERONE CYPIONATE 200 MG/ML IM SOLN
200.0000 mg | Freq: Once | INTRAMUSCULAR | Status: AC
Start: 2023-07-09 — End: 2023-07-09
  Administered 2023-07-09: 200 mg via INTRAMUSCULAR

## 2023-07-09 MED ORDER — TIRZEPATIDE 2.5 MG/0.5ML ~~LOC~~ SOAJ: 2.5000 mg | SUBCUTANEOUS | 2 refills | Status: DC

## 2023-07-09 NOTE — Progress Notes (Signed)
Patient here for a Testosterone injection 200mg /12ml IM injection in LEFT upper outer quadrant. Patient tolerated well and will return in 7 days for next injection.

## 2023-07-09 NOTE — Progress Notes (Signed)
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   Essential hypertension Discussed DASH (Dietary Approaches to Stop Hypertension) DASH diet is lower in sodium than a typical American diet. Cut back on foods that are high in saturated fat, cholesterol, and trans fats. Eat more whole-grain foods, fish, poultry, and nuts Remain active and exercise as tolerated daily.  Monitor BP at home-Call if greater than 130/80.  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 Recommended diet heavy in fruits and veggies, omega 3's. Decrease consumption of animal meats, cheeses, and dairy products. Remain active and exercise as tolerated. Continue to monitor. Check lipids/TSH  Uncontrolled type 2 diabetes mellitus with stage 2 chronic kidney disease, with long-term current use of insulin (HCC) Pending mounjaro, script resent, monitoring for PA, has ? Failed ozempic, trulicity Education: Reviewed 'ABCs' of diabetes management  Discussed goals to be met and/or maintained include A1C (<7) Blood pressure (<130/80) Cholesterol (LDL <70) Continue Eye Exam yearly  Continue Dental Exam Q6 mo Discussed dietary recommendations Discussed Physical Activity  recommendations Check A1C  CKD stage 2 due to type 2 diabetes mellitus (HCC) Discussed how what you eat and drink can aide in kidney protection. Stay well hydrated. Avoid high salt foods. Avoid NSAIDS. Keep BP and BG well controlled.   Take medications as prescribed. Remain active and exercise as tolerated daily. Maintain weight.  Continue to monitor. Check CMP/GFR/Microablumin  Morbid obesity (HCC) - BMI 30+ with OSA Discussed appropriate BMI Diet modification. Physical activity. Encouraged/praised to build confidence.  Obstructive sleep apnea-questioned Sleep apnea- continue autoCPAP, neuro following, weight loss advised  Poor compliance with diet Motivated, improved with continuous glucose monitoring Discuss at each visit  Medication management All medications discussed and reviewed in full. All questions and concerns regarding medications addressed.    [name] Continue supplement for goal of 60-100 Monitor Vitamin D levels  Testosterone deficiency -     Testosterone  Nonallergic rhinitis Monitor symptoms Avoid triggers  Screening prostate cancer        -     PSA  Recurrent kidney stones/uric acid On flomax with benefit; [facility] following Check Uric acid levels PRN  History of adenomatous polyp of colon Due for follow up 03/2032; high fiber/low red/processed meats discussed  B12 deficiency Monitor levels Discussed supplemental benefit  Orders Placed This Encounter  Procedures   CBC with Differential/Platelet   COMPLETE METABOLIC PANEL WITH GFR   Magnesium   Lipid panel   TSH   Hemoglobin A1C w/out eAG   Insulin, random   VITAMIN D 25 Hydroxy (Vit-D Deficiency, Fractures)   Urinalysis, Routine w reflex microscopic   Microalbumin / creatinine urine ratio   Vitamin B12   PSA   EKG 12-Lead    Notify office for further evaluation and treatment, questions or concerns if any reported s/s fail to improve.   The patient was  advised to call back or seek an  in-person evaluation if any symptoms worsen or if the condition fails to improve as anticipated.   Further disposition pending results of labs. Discussed med's effects and SE's.    I discussed the assessment and treatment plan with the patient. The patient was provided an opportunity to ask questions and all were answered. The patient agreed with the plan and demonstrated an understanding of the instructions.  Discussed med's effects and SE's. Screening labs and tests as requested with regular follow-up as recommended.  I provided 35 minutes of face-to-face time during this encounter including counseling, chart review, and critical decision making was preformed.  Today's Plan of Care is based on a patient-centered health care approach known as shared decision making - the decisions, tests and treatments allow for patient preferences and values to be balanced with clinical evidence.    Future Appointments  Date Time Provider Department Center  07/08/2024  3:00 PM Adela Glimpse, NP [facility] None     HPI 62 y.o. male patient presents for a complete physical. 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   He is married, 3 children, 4 grandchildren.  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   Severe OSA per  sleep study in 2014, machine was recalled, underwent repeat study in 01/2022 with Dr. Frances Furbish which found moderate OSA, now on autoPAP, reports 100% compliance, reports restorative sleep.   BMI is Body mass index is 34.49 kg/m., he has been working on diet and exercise. 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   His blood pressure has been controlled at home, today their BP is BP: 108/70 He does not workout. He denies chest pain, shortness of breath, dizziness.   He has been working on diet and exercise for Diabetes  with diabetic chronic kidney disease he is on ACE/ARB Hyperlipidemia -  on zetia and lipitor  he is on bASA every other day due to history of retroperitoneal hematoma   he is on 70/30 insulin he is taking variable, 20-30 units in am depending on glucose reading and 20-25 units with evening meal.  metformin 500mg , two tablets twice a day Pending mounjaro  he has continuous glucose monitor which is helping him to better control his glucose.   denies paresthesia of the feet, polydipsia, polyuria and visual disturbances.  Last A1C was:  Lab Results  Component Value Date   HGBA1C 6.5 (H) 03/25/2023   Follows with Dr. Kathrene Bongo due to hx of periphephretic hematoma following stone. Last GFR: Lab Results  Component Value Date   EGFR 68 03/25/2023   Lab Results  Component Value Date   CHOL 103 03/25/2023   HDL 32 (L) 03/25/2023   LDLCALC 58 03/25/2023  TRIG 57 03/25/2023   CHOLHDL 3.2 03/25/2023   Patient is on Vitamin D supplement.   Lab Results  Component Value Date   VD25OH 55 03/25/2023     Denies LUTs. Does take flomax daily due to frequent kidney stones, follows with [facility] Dr. Annabell Howells. Last  PSA was: Lab Results  Component Value Date   PSA 2.63 05/17/2022   He has a history of testosterone deficiency and is on testosterone replacement. He states that the testosterone helps with his energy. Lab Results  Component Value Date   TESTOSTERONE 230 (L) 08/29/2022    Current Medications:    Current Outpatient Medications (Endocrine & Metabolic):    tirzepatide (MOUNJARO) 12.5 MG/0.5ML Pen, Inject 12.5 mg into the skin once a week.   dexamethasone (DECADRON) 1 MG tablet, Take 3 tabs for 3 days, 2 tabs for 3 days 1 tab for 5 days. Take with food.   insulin NPH-regular Human (NOVOLIN 70/30) (70-30) 100 UNIT/ML injection, INJECT UP TO 50 UNITS 2 TIMES A DAY WITH MEALS FOR DIABETES   metFORMIN (GLUCOPHAGE-XR) 500 MG 24 hr tablet, TAKE 2 TABLETS TWICE DAILY WITH MEALS FOR DIABETES (DX - E11.9)   testosterone cypionate (DEPOTESTOSTERONE CYPIONATE) 200 MG/ML injection, Inject 1 mL (200 mg total) into the muscle every 14 (fourteen) days.  Current Outpatient Medications (Cardiovascular):    amLODipine (NORVASC) 10 MG tablet, TAKE 1 TABLET DAILY FOR BLOOD PRESSURE   atorvastatin (LIPITOR) 40 MG tablet, Take 1 tab daily for cholesterol.   bisoprolol (ZEBETA) 10 MG tablet, TAKE 1 TABLET DAILY FOR BP, PATIENT KNOWS TO TAKE BY MOUTH   ezetimibe (ZETIA) 10 MG tablet, TAKE 1 TABLET BY DAILY FOR CHOLESTEROL   indapamide (LOZOL) 1.25 MG tablet, Take 1.25 mg by mouth daily.   telmisartan (MICARDIS) 40 MG tablet, TAKE 1 TABLET DAILY FOR BLOOD PRESSURE & DIABETIC KIDNEY PROTECTION  Current Outpatient Medications (Respiratory):    ciclesonide (OMNARIS) 50 MCG/ACT nasal spray, Place 2 sprays into both nostrils daily as needed for allergies.   Current Outpatient Medications (Analgesics):    acetaminophen (TYLENOL) 500 MG tablet, Take 500 mg by mouth every 6 (six) hours as needed.   aspirin EC 81 MG tablet, Take 81 mg by mouth daily. Swallow whole.   Current Outpatient Medications (Other):     AMBULATORY NON FORMULARY MEDICATION, Diltiazem gel with 2% lidocaine  Apply a pea sized amount into your rectum three times daily Dispense 30 GM zero refill   AMBULATORY NON FORMULARY MEDICATION, Medication Name: Diltiazem with 2% Lidocaine compound  Apply to rectum 3 times daily as needed   amoxicillin (AMOXIL) 500 MG tablet, Take 1 tablet (500 mg total) by mouth 3 (three) times daily. 10 days   clotrimazole-betamethasone (LOTRISONE) cream, APPLY 3 TIMES A DAY AS NEEDED   Continuous Blood Gluc Sensor (FREESTYLE LIBRE 2 SENSOR) MISC, USE TO CHECK BLOOD SUGAR DAILY   Continuous Blood Gluc Sensor MISC, 1 each by Does not apply route as directed. Use as directed every 10 days. May dispense FreeStyle [employer] or similar.   diclofenac Sodium (PENNSAID) 2 % SOLN, Apply 2 Pump (40 mg total) topically 2 (two) times daily as needed.   glucose blood (FREESTYLE LITE) test strip, Check blood sugar 3 times a day or as directed. DX-E11.22   Insulin Syringe-Needle U-100 (TRUEPLUS INSULIN SYRINGE) 31G X 5/16" 0.5 ML MISC, USE TO INJECT INSULIN TWICE DAILY AS DIRECTED   ketoconazole (NIZORAL) 2 % shampoo, APPLY 1 APPLICATION TOPICALLY 2 (TWO) TIMES  A WEEK. APPLY & SHAMPOO 2 X /WEEK   methylcellulose (CITRUCEL) oral powder, Take 1 packet by mouth daily.   pantoprazole (PROTONIX) 20 MG tablet, TAKE 1 TABLET(20 MG) BY MOUTH DAILY   senna-docusate (SENOKOT-S) 8.6-50 MG tablet, Take 1 tablet by mouth every other day.   tamsulosin (FLOMAX) 0.4 MG CAPS capsule, Take 0.4 mg by mouth daily.   Allergies:  Allergies  Allergen Reactions   Other     Ppd Black Rubber Mix   Ppd [Tuberculin Purified Protein Derivative] Other (See Comments)    POSITIVE REACTION IN PAST 11/21/11 CXR NEG   Health Maintenance:  Immunization History  Administered Date(s) Administered   Influenza Split 10/10/2013   Influenza Whole 09/08/2012   Influenza-Unspecified 09/22/2018, 09/11/2019, 09/18/2019   Moderna Covid-19 Vaccine  Bivalent Booster 2yrs & up 10/02/2021   Moderna SARS-COV2 Booster Vaccination 05/26/2021   Moderna Sars-Covid-2 Vaccination 02/01/2020, 03/01/2020   Pneumococcal-Unspecified 12/31/2011   Tdap 12/19/2009, 04/08/2020    Tetanus: 2021 Shingrix: discussed, can get at pharmacy   Colonoscopy: 03/2022, hx of polyps but was clear, Dr. Adela Lank, 10 year recall  EGD:  Eye Exam: Dr. 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   Surgical History:  He  has a past surgical history that includes Wisdom tooth extraction; Hernia repair; Colonoscopy; and Rotator cuff repair (Left, 2017 sept 5).   Family History:  His family history includes Cancer (age of onset: 44) in his father; Diabetes in his brother, father, and mother; Diabetes Mellitus II in his paternal grandfather; Hyperlipidemia in his father and mother; Hypertension in his brother, father, and mother; Myelodysplastic syndrome in his mother; Sleep apnea in his mother.   Social History:   reports that he quit smoking about 22 years ago. His smoking use included cigarettes. He started smoking about 30 years ago. He has a 2.4 pack-year smoking history. He has never used smokeless  tobacco. He reports current alcohol use. He reports that he does not use drugs.   Review of Systems:  Review of Systems  Constitutional: Negative.  Negative for malaise/fatigue and weight loss.  HENT: Negative.  Negative for hearing loss and tinnitus.   Eyes: Negative.  Negative for blurred vision and double vision.  Respiratory: Negative.  Negative for cough, shortness of breath and wheezing.   Cardiovascular: Negative.  Negative for chest pain, palpitations, orthopnea, claudication and leg swelling.  Gastrointestinal: Negative.  Negative for abdominal pain, blood in stool, constipation, diarrhea, heartburn, melena, nausea and vomiting.  Genitourinary: Negative.   Musculoskeletal: Negative.  Negative for joint pain and myalgias.  Skin: Negative.  Negative for rash.  Neurological: Negative.  Negative for dizziness, tingling, sensory change, weakness and headaches.  Endo/Heme/Allergies: Negative.  Negative for polydipsia.  Psychiatric/Behavioral: Negative.    All other systems reviewed and are negative.   Physical Exam: Estimated body mass index is 34.49 kg/m  as calculated from the following:   Height as of this encounter: 5\' 10"  (1.778 m).   Weight as of this encounter: 240 lb 6.4 oz (109 kg). BP 108/70   Pulse 71   Temp 97.7 F (36.5 C)   Ht 5\' 10"  (1.778 m)   Wt 240 lb 6.4 oz (109 kg)   SpO2 98%   BMI 34.49 kg/m  General Appearance: Well nourished, well dressed obese AA adult male in no apparent distress.  Eyes: PERRLA, EOMs, conjunctiva no swelling or erythema Sinuses: No Frontal/maxillary tenderness  ENT/Mouth: Ext aud canals clear, normal light reflex with TMs without erythema, bulging. Good dentition. No erythema, swelling, or exudate on post pharynx. Tonsils not swollen or erythematous. Hearing normal.  Neck: Supple, thyroid normal. No bruits  Respiratory: Respiratory effort normal, BS equal bilaterally without rales, rhonchi, wheezing or stridor.  Cardio: RRR without  murmurs, prominent S2, no rubs or gallops. Brisk peripheral pulses without edema.  Chest: symmetric, with normal excursions and percussion.  Abdomen: Soft,obese abdomen, nontender, no guarding, rebound, hernias, masses, or organomegaly.  Lymphatics: Tender right upper anterior cervical chain with single palpable lymph node; otherwise without lymphadenopathy.  Genitourinary: declines, doing self exams, no concerns, urology follows Musculoskeletal: Full ROM all peripheral extremities,5/5 strength, and normal gait.  Skin: Warm, dry without rashes, lesions, ecchymosis. Neuro: Cranial nerves intact, reflexes equal bilaterally. Normal muscle tone, no cerebellar symptoms. Sensation intact to monofilament bil feet.  Psych: Awake and oriented X 3, normal affect, Insight and Judgment appropriate.   EKG: Sinus brady, NSCPT   , NP-C 3:42 PM [facility]

## 2023-07-11 ENCOUNTER — Encounter: Payer: Self-pay | Admitting: Nurse Practitioner

## 2023-07-14 NOTE — Patient Instructions (Signed)

## 2023-07-16 ENCOUNTER — Ambulatory Visit: Payer: BC Managed Care – PPO | Admitting: Family Medicine

## 2023-07-18 ENCOUNTER — Ambulatory Visit: Payer: BC Managed Care – PPO

## 2023-07-18 VITALS — BP 130/78 | HR 82 | Temp 97.5°F | Ht 70.0 in | Wt 249.0 lb

## 2023-07-18 DIAGNOSIS — E349 Endocrine disorder, unspecified: Secondary | ICD-10-CM

## 2023-07-18 MED ORDER — TESTOSTERONE CYPIONATE 200 MG/ML IM SOLN
200.0000 mg | Freq: Once | INTRAMUSCULAR | Status: AC
Start: 2023-07-18 — End: 2023-07-18
  Administered 2023-07-18: 200 mg via INTRAMUSCULAR

## 2023-07-18 NOTE — Progress Notes (Signed)
Patient here for a Testosterone injection 200mg /56ml IM injection in RIGHT upper outer quadrant. Patient tolerated well and will return in 7 days for next injection.

## 2023-07-25 ENCOUNTER — Ambulatory Visit: Payer: BC Managed Care – PPO

## 2023-08-05 ENCOUNTER — Ambulatory Visit (INDEPENDENT_AMBULATORY_CARE_PROVIDER_SITE_OTHER): Payer: BC Managed Care – PPO | Admitting: Nurse Practitioner

## 2023-08-05 ENCOUNTER — Ambulatory Visit: Payer: BC Managed Care – PPO

## 2023-08-05 ENCOUNTER — Other Ambulatory Visit: Payer: Self-pay

## 2023-08-05 ENCOUNTER — Encounter: Payer: Self-pay | Admitting: Nurse Practitioner

## 2023-08-05 ENCOUNTER — Other Ambulatory Visit: Payer: Self-pay | Admitting: Nurse Practitioner

## 2023-08-05 VITALS — HR 69 | Temp 97.4°F

## 2023-08-05 DIAGNOSIS — Z1152 Encounter for screening for COVID-19: Secondary | ICD-10-CM | POA: Diagnosis not present

## 2023-08-05 DIAGNOSIS — U071 COVID-19: Secondary | ICD-10-CM

## 2023-08-05 LAB — POC COVID19 BINAXNOW: SARS Coronavirus 2 Ag: POSITIVE — AB

## 2023-08-05 MED ORDER — PREDNISONE 10 MG PO TABS: ORAL_TABLET | ORAL | 0 refills | Status: DC

## 2023-08-05 MED ORDER — MOUNJARO 5 MG/0.5ML ~~LOC~~ SOAJ: 5.0000 mg | SUBCUTANEOUS | 2 refills | Status: DC

## 2023-08-05 MED ORDER — AZITHROMYCIN 250 MG PO TABS: ORAL_TABLET | ORAL | 1 refills | Status: DC

## 2023-08-05 NOTE — Progress Notes (Signed)
THIS ENCOUNTER IS A VIRTUAL VISIT DUE TO COVID-19 - PATIENT WAS NOT SEEN IN THE OFFICE.  PATIENT HAS CONSENTED TO VIRTUAL VISIT / TELEMEDICINE VISIT   Virtual Visit via telephone Note  I connected with  Russell [name] on 08/05/2023 by telephone.  I verified that I am speaking with the correct person using two identifiers.    I discussed the limitations of evaluation and management by telemedicine and the availability of in person appointments. The patient expressed understanding and agreed to proceed.  History of Present Illness:  Pulse 69   Temp (!) 97.4 F (36.3 C)   SpO2 98%   62 y.o. patient contacted office reporting URI sx nasal congestion, HA, cough. he tested positive by POCT test at office. OV was conducted by telephone to minimize exposure. This patient has been vaccinated for covid 19, last 05/2021.  He has recently returned from traveling to [place].    Sx began 3 days ago with congestion.  Treatments tried so far: None  Exposures: Travel   Medications  Current Outpatient Medications (Endocrine & Metabolic):    insulin NPH-regular Human (NOVOLIN 70/30) (70-30) 100 UNIT/ML injection, INJECT UP TO 50 UNITS 2 TIMES A DAY WITH MEALS FOR DIABETES   metFORMIN (GLUCOPHAGE-XR) 500 MG 24 hr tablet, TAKE 2 TABLETS TWICE DAILY WITH MEALS FOR DIABETES (DX - E11.9)   testosterone cypionate (DEPOTESTOSTERONE CYPIONATE) 200 MG/ML injection, Inject 1 mL (200 mg total) into the muscle every 14 (fourteen) days. (Patient taking differently: Inject 200 mg into the muscle once a week.)   tirzepatide (MOUNJARO) 2.5 MG/0.5ML Pen, Inject 2.5 mg into the skin once a week.  Current Outpatient Medications (Cardiovascular):    amLODipine (NORVASC) 10 MG tablet, TAKE 1 TABLET DAILY FOR BLOOD PRESSURE   atorvastatin (LIPITOR) 40 MG tablet, Take 1 tab daily for cholesterol.   bisoprolol (ZEBETA) 10 MG tablet, TAKE 1 TABLET DAILY FOR BP, PATIENT KNOWS TO TAKE BY MOUTH   ezetimibe (ZETIA) 10 MG  tablet, TAKE 1 TABLET BY DAILY FOR CHOLESTEROL   indapamide (LOZOL) 1.25 MG tablet, Take 1.25 mg by mouth daily.   telmisartan (MICARDIS) 40 MG tablet, TAKE 1 TABLET DAILY FOR BLOOD PRESSURE & DIABETIC KIDNEY PROTECTION  Current Outpatient Medications (Respiratory):    ciclesonide (OMNARIS) 50 MCG/ACT nasal spray, Place 2 sprays into both nostrils daily as needed for allergies.   Current Outpatient Medications (Analgesics):    acetaminophen (TYLENOL) 500 MG tablet, Take 500 mg by mouth every 6 (six) hours as needed.   aspirin EC 81 MG tablet, Take 81 mg by mouth daily. Swallow whole.   Current Outpatient Medications (Other):    AMBULATORY NON FORMULARY MEDICATION, Diltiazem gel with 2% lidocaine  Apply a pea sized amount into your rectum three times daily Dispense 30 GM zero refill   AMBULATORY NON FORMULARY MEDICATION, Medication Name: Diltiazem with 2% Lidocaine compound  Apply to rectum 3 times daily as needed   clotrimazole-betamethasone (LOTRISONE) cream, APPLY 3 TIMES A DAY AS NEEDED   Continuous Blood Gluc Sensor (FREESTYLE LIBRE 2 SENSOR) MISC, USE TO CHECK BLOOD SUGAR DAILY   Continuous Blood Gluc Sensor MISC, 1 each by Does not apply route as directed. Use as directed every 10 days. May dispense FreeStyle [employer] or similar.   glucose blood (FREESTYLE LITE) test strip, Check blood sugar 3 times a day or as directed. DX-E11.22   Insulin Syringe-Needle U-100 (TRUEPLUS INSULIN SYRINGE) 31G X 5/16" 0.5 ML MISC, USE TO INJECT INSULIN TWICE  DAILY AS DIRECTED   ketoconazole (NIZORAL) 2 % shampoo, APPLY 1 APPLICATION TOPICALLY 2 (TWO) TIMES A WEEK. APPLY & SHAMPOO 2 X /WEEK   pantoprazole (PROTONIX) 20 MG tablet, TAKE 1 TABLET(20 MG) BY MOUTH DAILY   tamsulosin (FLOMAX) 0.4 MG CAPS capsule, Take 0.4 mg by mouth daily.  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   Social History:   reports that he quit smoking about 22 years ago. His smoking use included cigarettes. He started smoking about 30 years ago. He has a 2.4 pack-year smoking history. He has never used smokeless tobacco. He reports current alcohol use. He reports that he does not use drugs.  Observations/Objective:  General : Well sounding patient in no apparent distress HEENT: no hoarseness, no cough for duration of visit Lungs: speaks in complete sentences, no audible wheezing, no apparent distress Neurological: alert, oriented x 3 Psychiatric: pleasant, judgement appropriate   Assessment and Plan:  Covid 19 Covid 19 positive per rapid screening test in office Risk factors include: has Nonallergic rhinitis; Obstructive sleep apnea-questioned; Hypertension; Hyperlipidemia associated with type 2 diabetes mellitus (HCC); Poorly controlled type 2 diabetes mellitus (HCC); [name]; Testosterone deficiency; Morbid obesity (HCC); Poor compliance with diet; Medication management; 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 Symptoms are: mild Due to co morbid conditions and risk factors, discussed antivirals  Immue support reviewed Vitamin C, Vitamin D, Zinc. Take tylenol PRN temp 101+ Push hydration Regular ambulation or calf exercises exercises for clot prevention and 81 mg ASA unless contraindicated Sx supportive therapy suggested Follow up via mychart or telephone if needed Advised patient obtain O2 monitor; present to ED if persistently <90% or with severe dyspnea, CP, fever uncontrolled by tylenol, confusion, sudden decline       Should remain in isolation 5 days from testing positive and then wear a mask when around other people for the following 5 days  Encounter for screening for COVID-19 POSITIVE  - POC COVID-19  Meds ordered this encounter  Medications   azithromycin (ZITHROMAX) 250 MG tablet    Sig: Take 2 tablets on  Day 1,  followed by 1 tablet  daily for 4 more days    for Sinusitis  /Bronchitis    Dispense:  6 each    Refill:  1    Order Specific Question:   Supervising Provider    Answer:   Lucky Cowboy [[id number]]   predniSONE (DELTASONE) 10 MG tablet    Sig: 1 tab 3 x day for 2 days, then 1  tab 2 x day for 2 days, then 1 tab 1 x day for 3 days    Dispense:  13 tablet    Refill:  0    Order Specific Question:   Supervising Provider    Answer:   Lucky Cowboy [phone]    Follow Up Instructions:  I discussed the assessment and treatment plan with the patient. The patient was provided an opportunity to ask questions and all were answered. The patient agreed with the plan and demonstrated an understanding of the instructions.   The patient was advised to call back or seek an in-person evaluation if the symptoms worsen or if the condition fails to improve as anticipated.  I provided 15 minutes of non-face-to-face time during this encounter.   Adela Glimpse, NP

## 2023-08-05 NOTE — Patient Instructions (Signed)

## 2023-08-25 MED ORDER — TIRZEPATIDE 7.5 MG/0.5ML ~~LOC~~ SOAJ
SUBCUTANEOUS | 0 refills | Status: DC
Start: 2023-08-25 — End: 2023-09-23

## 2023-08-29 ENCOUNTER — Other Ambulatory Visit: Payer: Self-pay | Admitting: Nurse Practitioner

## 2023-09-20 DIAGNOSIS — Z23 Encounter for immunization: Secondary | ICD-10-CM | POA: Diagnosis not present

## 2023-09-23 MED ORDER — TIRZEPATIDE 10 MG/0.5ML ~~LOC~~ SOAJ: SUBCUTANEOUS | 0 refills | Status: DC

## 2023-10-02 ENCOUNTER — Telehealth: Payer: Self-pay | Admitting: Nurse Practitioner

## 2023-10-02 NOTE — Telephone Encounter (Signed)
Pt gets testosterone injections by nurse. He is going to start administering the injections himself. He does not have the needles though. If you could send in a script to [id number] - [place], [place] - [address] AT [facility] OF [address] & [address]

## 2023-10-03 MED ORDER — "SAFETY SYRINGE/NEEDLE 21G X 1"" 3 ML MISC": 0 refills | Status: AC

## 2023-10-03 NOTE — Addendum Note (Signed)
Addended by: Dionicio Stall on: 10/03/2023 12:06 PM   Modules accepted: Orders

## 2023-10-17 ENCOUNTER — Other Ambulatory Visit (HOSPITAL_BASED_OUTPATIENT_CLINIC_OR_DEPARTMENT_OTHER): Payer: Self-pay

## 2023-10-17 MED ORDER — COMIRNATY 30 MCG/0.3ML IM SUSY
PREFILLED_SYRINGE | INTRAMUSCULAR | 0 refills | Status: AC
  Filled 2023-10-17: qty 0.3, 1d supply, fill #0

## 2023-10-24 ENCOUNTER — Encounter: Payer: Self-pay | Admitting: Nurse Practitioner

## 2023-10-24 MED ORDER — MOUNJARO 12.5 MG/0.5ML ~~LOC~~ SOAJ: 12.5000 mg | SUBCUTANEOUS | 2 refills | Status: DC

## 2023-11-01 ENCOUNTER — Other Ambulatory Visit: Payer: Self-pay | Admitting: Nurse Practitioner

## 2023-11-04 ENCOUNTER — Encounter: Payer: Self-pay | Admitting: Nurse Practitioner

## 2023-11-05 ENCOUNTER — Encounter: Payer: Self-pay | Admitting: Nurse Practitioner

## 2023-11-05 ENCOUNTER — Ambulatory Visit (INDEPENDENT_AMBULATORY_CARE_PROVIDER_SITE_OTHER): Payer: BC Managed Care – PPO | Admitting: Nurse Practitioner

## 2023-11-05 VITALS — BP 112/76 | HR 76 | Temp 97.8°F | Ht 70.0 in | Wt 249.0 lb

## 2023-11-05 DIAGNOSIS — E349 Endocrine disorder, unspecified: Secondary | ICD-10-CM

## 2023-11-05 DIAGNOSIS — E785 Hyperlipidemia, unspecified: Secondary | ICD-10-CM

## 2023-11-05 DIAGNOSIS — E559 Vitamin D deficiency, unspecified: Secondary | ICD-10-CM

## 2023-11-05 DIAGNOSIS — E1159 Type 2 diabetes mellitus with other circulatory complications: Secondary | ICD-10-CM | POA: Diagnosis not present

## 2023-11-05 DIAGNOSIS — E1122 Type 2 diabetes mellitus with diabetic chronic kidney disease: Secondary | ICD-10-CM

## 2023-11-05 DIAGNOSIS — E1165 Type 2 diabetes mellitus with hyperglycemia: Secondary | ICD-10-CM

## 2023-11-05 DIAGNOSIS — I708 Atherosclerosis of other arteries: Secondary | ICD-10-CM

## 2023-11-05 DIAGNOSIS — I10 Essential (primary) hypertension: Secondary | ICD-10-CM

## 2023-11-05 DIAGNOSIS — N200 Calculus of kidney: Secondary | ICD-10-CM

## 2023-11-05 DIAGNOSIS — E1169 Type 2 diabetes mellitus with other specified complication: Secondary | ICD-10-CM

## 2023-11-05 DIAGNOSIS — Z79899 Other long term (current) drug therapy: Secondary | ICD-10-CM

## 2023-11-05 DIAGNOSIS — I700 Atherosclerosis of aorta: Secondary | ICD-10-CM

## 2023-11-05 DIAGNOSIS — E538 Deficiency of other specified B group vitamins: Secondary | ICD-10-CM

## 2023-11-05 DIAGNOSIS — Z91199 Patient's noncompliance with other medical treatment and regimen due to unspecified reason: Secondary | ICD-10-CM

## 2023-11-05 DIAGNOSIS — G4733 Obstructive sleep apnea (adult) (pediatric): Secondary | ICD-10-CM

## 2023-11-05 DIAGNOSIS — N182 Chronic kidney disease, stage 2 (mild): Secondary | ICD-10-CM

## 2023-11-05 DIAGNOSIS — Z860101 Personal history of adenomatous and serrated colon polyps: Secondary | ICD-10-CM

## 2023-11-05 NOTE — Progress Notes (Signed)
Follow Up  Assessment and Plan:  Atherosclerosis of aortic bifurcation and common iliac arteries (HCC)/Hyperlipidemia  Per MRI 2021 Discussed lifestyle modifications. Recommended diet heavy in fruits and veggies, omega 3's. Decrease consumption of animal meats, cheeses, and dairy products. Remain active and exercise as tolerated. Continue to monitor. Check lipids  Essential hypertension Discussed DASH (Dietary Approaches to Stop Hypertension) DASH diet is lower in sodium than a typical American diet. Cut back on foods that are high in saturated fat, cholesterol, and trans fats. Eat more whole-grain foods, fish, poultry, and nuts Remain active and exercise as tolerated daily.  Monitor BP at home-Call if greater than 130/80.  Check CMP/CBC  Diabetes mellitus with circulatory disorder causing erectile dysfunction [facility]) Urology following Education: Reviewed 'ABCs' of diabetes management  Discussed goals to be met and/or maintained include A1C (<7) Blood pressure (<130/80) Cholesterol (LDL <70) Continue Eye Exam yearly  Continue Dental Exam Q6 mo Discussed dietary recommendations Discussed Physical Activity recommendations Check A1C  Hyperlipidemia associated with type 2 diabetes mellitus (HCC) Discussed lifestyle modifications. Recommended diet heavy in fruits and veggies, omega 3's. Decrease consumption of animal meats, cheeses, and dairy products. Remain active and exercise as tolerated. Continue to monitor. Check lipids/TSH  Uncontrolled type 2 diabetes mellitus with stage 2 chronic kidney disease, with long-term current use of insulin (HCC) Continue Mounjaro 12.5 mg/Weekly  Education: Reviewed 'ABCs' of diabetes management  Discussed goals to be met and/or maintained include A1C (<7) Blood pressure (<130/80) Cholesterol (LDL <70) Continue Eye Exam yearly  Continue Dental Exam Q6 mo Discussed dietary recommendations Discussed Physical Activity  recommendations Check A1C  CKD stage 2 due to type 2 diabetes mellitus (HCC) Discussed how what you eat and drink can aide in kidney protection. Stay well hydrated. Avoid high salt foods. Avoid NSAIDS. Keep BP and BG well controlled.   Take medications as prescribed. Remain active and exercise as tolerated daily. Maintain weight.  Continue to monitor. Check CMP/GFR/Microablumin  Morbid obesity (HCC) - BMI 30+ with OSA Discussed appropriate BMI Diet modification. Physical activity. Encouraged/praised to build confidence.  Obstructive sleep apnea-questioned Sleep apnea- continue autoCPAP, neuro following, weight loss advised  Poor compliance with diet Motivated, improved with continuous glucose monitoring Discuss at each visit  Medication management All medications discussed and reviewed in full. All questions and concerns regarding medications addressed.    [name] Continue supplement for goal of 60-100 Monitor Vitamin D levels  Testosterone deficiency Continue HRT Monitor RBC - elevated last review - obtain updated levels today Discussed r/f polycythemia Continue to monitor  Recurrent kidney stones/uric acid On flomax with benefit; [facility] following Check Uric acid levels PRN  History of adenomatous polyp of colon Due for follow up 03/2032; high fiber/low red/processed meats discussed  B12 deficiency Monitor levels Discussed supplemental benefit  Orders Placed This Encounter  Procedures   CBC with Differential/Platelet   COMPLETE METABOLIC PANEL WITH GFR   Lipid panel   Hemoglobin A1c   Notify office for further evaluation and treatment, questions or concerns if any reported s/s fail to improve.   The patient was advised to call back or seek an in-person evaluation if any symptoms worsen or if the condition fails to improve as anticipated.   Further disposition pending results of labs. Discussed med's effects and SE's.    I  discussed the assessment and treatment plan with the patient. The patient was provided an opportunity to ask questions and all were answered. The patient agreed with the plan and  demonstrated an understanding of the instructions.  Discussed med's effects and SE's. Screening labs and tests as requested with regular follow-up as recommended.  I provided 30 minutes of face-to-face time during this encounter including counseling, chart review, and critical decision making was preformed.  Today's Plan of Care is based on a patient-centered health care approach known as shared decision making - the decisions, tests and treatments allow for patient preferences and values to be balanced with clinical evidence.    Future Appointments  Date Time Provider Department Center  07/08/2024  3:00 PM Adela Glimpse, NP [facility] None     HPI 62 y.o. male patient presents for a general follow up. 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  Severe OSA per sleep study in 2014, machine was recalled, underwent repeat study in 01/2022 with Dr. Frances Furbish which found moderate OSA, now on autoPAP, reports 100% compliance, reports restorative sleep.   BMI is Body mass index is 35.73 kg/m., he has been working on diet and exercise. 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   His blood pressure has been controlled at home, today their BP is BP: 112/76 He does not workout. He denies chest pain, shortness of breath, dizziness.   He has been working on diet and exercise for Diabetes  with diabetic chronic kidney disease he is on ACE/ARB Hyperlipidemia -  on zetia and lipitor  he is on bASA every other day due to history of retroperitoneal hematoma   he is on 70/30 insulin he is taking variable, 20-30 units in am depending on glucose reading and 20-25 units with evening meal.  metformin 500mg , two tablets twice a day Mounjaro - 12.5 mg/Week he has continuous glucose monitor which is helping him to better control his glucose.   denies paresthesia of the feet, polydipsia, polyuria and visual disturbances.  Last A1C was:  Lab Results  Component Value Date   HGBA1C 10.1 (H) 07/09/2023   Follows with Dr. Kathrene Bongo due to hx of periphephretic hematoma following stone. Last GFR: Lab Results  Component Value Date   EGFR 66 07/09/2023   Lab Results  Component Value Date   CHOL 168 07/09/2023   HDL 42 07/09/2023   LDLCALC 101 (H) 07/09/2023   TRIG 150 (H) 07/09/2023   CHOLHDL 4.0 07/09/2023   Patient is on Vitamin D supplement.   Lab Results  Component Value Date   VD25OH 46 07/09/2023     Denies LUTs. Does take flomax daily due to frequent kidney stones, follows with [facility] Dr. Annabell Howells. Last PSA was: Lab Results  Component Value Date   PSA 2.67 07/09/2023   He has a history of testosterone deficiency and is on testosterone replacement. He states that the testosterone helps with his energy. Lab  Results  Component Value Date   TESTOSTERONE 230 (L) 08/29/2022    Current Medications:    Current Outpatient Medications (Endocrine & Metabolic):     insulin NPH-regular Human (NOVOLIN 70/30) (70-30) 100 UNIT/ML injection, INJECT UP TO 50 UNITS 2 TIMES A DAY WITH MEALS FOR DIABETES   metFORMIN (GLUCOPHAGE-XR) 500 MG 24 hr tablet, TAKE 2 TABLETS TWICE DAILY WITH MEALS FOR DIABETES (DX - E11.9)   testosterone cypionate (DEPOTESTOSTERONE CYPIONATE) 200 MG/ML injection, Inject 1 mL (200 mg total) into the muscle every 14 (fourteen) days. (Patient taking differently: Inject 200 mg into the muscle once a week.)   tirzepatide (MOUNJARO) 12.5 MG/0.5ML Pen, Inject 12.5 mg into the skin once a week.   predniSONE (DELTASONE) 10 MG tablet, 1 tab 3 x day for 2 days, then 1 tab 2 x day for 2 days, then 1 tab 1 x day for 3 days  Current Outpatient Medications (Cardiovascular):    amLODipine (NORVASC) 10 MG tablet, TAKE 1 TABLET DAILY FOR BLOOD PRESSURE   atorvastatin (LIPITOR) 40 MG tablet, Take 1 tab daily for cholesterol.   bisoprolol (ZEBETA) 10 MG tablet, TAKE 1 TABLET DAILY FOR BP, PATIENT KNOWS TO TAKE BY MOUTH   ezetimibe (ZETIA) 10 MG tablet, TAKE 1 TABLET BY DAILY FOR CHOLESTEROL   indapamide (LOZOL) 1.25 MG tablet, Take 1.25 mg by mouth daily.   telmisartan (MICARDIS) 40 MG tablet, TAKE 1 TABLET DAILY FOR BLOOD PRESSURE & DIABETIC KIDNEY PROTECTION  Current Outpatient Medications (Respiratory):    ciclesonide (OMNARIS) 50 MCG/ACT nasal spray, Place 2 sprays into both nostrils daily as needed for allergies.   Current Outpatient Medications (Analgesics):    acetaminophen (TYLENOL) 500 MG tablet, Take 500 mg by mouth every 6 (six) hours as needed.   aspirin EC 81 MG tablet, Take 81 mg by mouth daily. Swallow whole.   Current Outpatient Medications (Other):    AMBULATORY NON FORMULARY MEDICATION, Diltiazem gel with 2% lidocaine  Apply a pea sized amount into your rectum three times daily Dispense 30 GM zero refill   AMBULATORY NON FORMULARY MEDICATION, Medication Name: Diltiazem with 2% Lidocaine compound  Apply to rectum 3 times daily as needed    clotrimazole-betamethasone (LOTRISONE) cream, APPLY 3 TIMES A DAY AS NEEDED   Continuous Blood Gluc Sensor (FREESTYLE LIBRE 2 SENSOR) MISC, USE TO CHECK BLOOD SUGAR DAILY   Continuous Blood Gluc Sensor MISC, 1 each by Does not apply route as directed. Use as directed every 10 days. May dispense FreeStyle [employer] or similar.   COVID-19 mRNA vaccine, Pfizer, (COMIRNATY) syringe, Inject into the muscle.   glucose blood (FREESTYLE LITE) test strip, Check blood sugar 3 times a day or as directed. DX-E11.22   Insulin Syringe-Needle U-100 (TRUEPLUS INSULIN SYRINGE) 31G X 5/16" 0.5 ML MISC, USE TO INJECT INSULIN TWICE DAILY AS DIRECTED   ketoconazole (NIZORAL) 2 % shampoo, APPLY 1 APPLICATION TOPICALLY 2 (TWO) TIMES A WEEK. APPLY & SHAMPOO 2 X /WEEK   pantoprazole (PROTONIX) 20 MG tablet, TAKE 1 TABLET(20 MG) BY MOUTH DAILY   SYRINGE-NEEDLE, DISP, 3 ML (SAFETY SYRINGE/NEEDLE) 21G X 1" 3 ML MISC, Use to inject testosterone into the skin weekly   tamsulosin (FLOMAX) 0.4 MG CAPS capsule, Take 0.4 mg by mouth daily.   azithromycin (ZITHROMAX) 250 MG tablet, Take 2 tablets on  Day 1,  followed by 1 tablet  daily for 4 more days    for Sinusitis  /Bronchitis   Allergies:  Allergies  Allergen Reactions  Other     Ppd Soil scientist [Tuberculin Purified Protein Derivative] Other (See Comments)    POSITIVE REACTION IN PAST 11/21/11 CXR NEG   Health Maintenance:  Immunization History  Administered Date(s) Administered   Influenza Inj Mdck Quad With Preservative 09/20/2023   Influenza Split 10/10/2013   Influenza Whole 09/08/2012   Influenza-Unspecified 09/22/2018, 09/11/2019, 09/18/2019, 09/22/2021, 09/21/2022   Moderna Covid-19 Vaccine Bivalent Booster 67yrs & up 10/02/2021   Moderna SARS-COV2 Booster Vaccination 05/26/2021   Moderna Sars-Covid-2 Vaccination 02/01/2020, 03/01/2020   Pfizer(Comirnaty)Fall Seasonal Vaccine 12 years and older 10/17/2023   Pneumococcal-Unspecified  12/31/2011   Tdap 12/19/2009, 04/08/2020    Patient Care Team: Lucky Cowboy, MD as PCP - General (Internal Medicine)  Medical History:  has Nonallergic rhinitis; Obstructive sleep apnea-questioned; Hypertension; Hyperlipidemia associated with type 2 diabetes mellitus (HCC); Poorly controlled type 2 diabetes mellitus (HCC); [name]; Testosterone deficiency; Morbid obesity (HCC); Poor compliance with diet; Medication management; 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   Surgical History:  He  has a past surgical history that includes Wisdom tooth extraction; Hernia repair; Colonoscopy; and Rotator cuff repair (Left, 2017 sept 5).   Family History:  His family history includes Cancer (age of onset: 48) in his father; Diabetes in his brother, father, and mother; Diabetes Mellitus II in his paternal grandfather; Hyperlipidemia in his father and mother; Hypertension in his brother, father, and mother; Myelodysplastic syndrome in his mother; Sleep apnea in his mother.   Social History:   reports that he quit smoking about 22 years ago. His smoking use included cigarettes. He started smoking about 30 years ago. He has a 2.4 pack-year smoking history. He has never used smokeless tobacco. He reports current alcohol use. He reports that he does not use drugs.   Review of Systems:  Review of Systems  Constitutional: Negative.  Negative for malaise/fatigue and weight loss.  HENT: Negative.  Negative for hearing loss and tinnitus.   Eyes: Negative.  Negative for blurred vision and double vision.  Respiratory: Negative.  Negative for cough, shortness of breath and wheezing.   Cardiovascular:  Negative.  Negative for chest pain, palpitations, orthopnea, claudication and leg swelling.  Gastrointestinal: Negative.  Negative for abdominal pain, blood in stool, constipation, diarrhea, heartburn, melena, nausea and vomiting.  Genitourinary: Negative.   Musculoskeletal: Negative.  Negative for joint pain and myalgias.  Skin: Negative.  Negative for rash.  Neurological: Negative.  Negative for dizziness, tingling, sensory change, weakness and headaches.  Endo/Heme/Allergies: Negative.  Negative for polydipsia.  Psychiatric/Behavioral: Negative.    All other systems reviewed and are negative.   Physical Exam: Estimated body mass index is 35.73 kg/m as calculated from the following:   Height as of this encounter: 5\' 10"  (1.778 m).   Weight as of this encounter: 249 lb (112.9 kg). BP 112/76   Pulse 76   Temp 97.8 F (36.6 C)   Ht 5\' 10"  (1.778 m)   Wt 249 lb (112.9 kg)   SpO2 98%   BMI 35.73 kg/m   General Appearance: Well nourished, well dressed obese AA adult male in no apparent distress.  Eyes: PERRLA, EOMs, conjunctiva no swelling or erythema Sinuses: No Frontal/maxillary tenderness  ENT/Mouth: Ext aud  canals clear, normal light reflex with TMs without erythema, bulging. Good dentition. No erythema, swelling, or exudate on post pharynx. Tonsils not swollen or erythematous. Hearing normal.  Neck: Supple, thyroid normal. No bruits  Respiratory: Respiratory effort normal, BS equal bilaterally without rales, rhonchi, wheezing or stridor.  Cardio: RRR without murmurs, prominent S2, no rubs or gallops. Brisk peripheral pulses without edema.  Chest: symmetric, with normal excursions and percussion.  Abdomen: Soft,obese abdomen, nontender, no guarding, rebound, hernias, masses, or organomegaly.  Lymphatics: Tender right upper anterior cervical chain with single palpable lymph node; otherwise without lymphadenopathy.  Genitourinary: declines, doing self exams, no concerns, urology  follows Musculoskeletal: Full ROM all peripheral extremities,5/5 strength, and normal gait.  Skin: Warm, dry without rashes, lesions, ecchymosis. Neuro: Cranial nerves intact, reflexes equal bilaterally. Normal muscle tone, no cerebellar symptoms. Sensation intact to monofilament bil feet.  Psych: Awake and oriented X 3, normal affect, Insight and Judgment appropriate.    Adela Glimpse, NP-C 12:28 PM [facility]

## 2023-11-05 NOTE — Patient Instructions (Signed)
Polycythemia Vera  Polycythemia vera (PV) is a form of blood cancer called a myeloproliferative neoplasm (MPN) in which the bone marrow makes too many red blood cells. The bone marrow may also make too many clotting cells (platelets) and white blood cells. Bone marrow is the spongy center of bones where blood cells are produced. Sometimes, this causes an overproduction of blood cells in the liver and spleen, causing those organs to become enlarged. Additionally, people who have PV are at a higher risk for stroke or heart attack because their blood may clot more easily. PV is a long-term, or chronic, disease. What are the causes? Almost all people who have PV have an abnormal gene (genetic mutation) that causes changes in the way that the bone marrow makes blood cells. This gene, which is called JAK2, is not hereditary. This means that it is not passed along from parent to child. It is not known what triggers the genetic mutation that causes the body to produce too many red blood cells. What increases the risk? You are more likely to develop this condition if: You are male. You are 61 years of age or older. What are the signs or symptoms? You may not have any symptoms in the early stage of PV. When symptoms develop, they may include: Itchy skin, especially after bathing. Burning sensation in the hands or feet. Abdominal fullness or feeling full soon after eating. Ulcers on fingers or toes. Shortness of breath. Bone or joint pain. Dizziness. Headache. Tiredness. Ringing in the ears. Weight loss. Unusual bleeding, including nosebleeds or bleeding from gums. How is this diagnosed? This condition may be diagnosed during a routine physical exam and a blood test called a complete blood count (CBC). Your health care provider may also suspect PV if you have symptoms. During the physical exam, your health care provider may find that you have an enlarged liver or spleen. You may also have tests to  confirm the diagnosis. These may include: A procedure to remove a sample of bone marrow for testing (bone marrow biopsy). Blood tests to check for: The JAK2 gene. Low levels of a hormone that helps to regulate red blood cell production (erythropoietin). How is this treated? There is no cure for PV, but treatment can help to control the disease. There are several types of treatment. No single treatment works for everyone. You will need to work with a blood cancer specialist (hematologist) to find the treatment that is best for you. This condition may be treated by: Periodically having some blood removed with a needle (phlebotomy) to lower the number of red blood cells. Taking medicine. Your health care provider may recommend: Low-dose aspirin to lower your risk for blood clots. A medicine to control blood counts. A medicine that slows down the effects of JAK2 gene. Other medicines to treat symptoms such as itching. Follow these instructions at home:  Take over-the-counter and prescription medicines only as told by your health care provider. Do regular exercise as told by your health care provider. Check your hands and feet regularly for any sores that do not heal. Do not use any products that contain nicotine or tobacco. These products include cigarettes, chewing tobacco, and vaping devices, such as e-cigarettes. If you need help quitting, ask your health care provider. Return to your normal activities as told by your health care provider. Ask your health care provider what activities are safe for you. Keep all follow-up visits. This is important. Contact a health care provider if: You have  side effects from your medicines. Your symptoms change or get worse at home. You have blood in your stool or you vomit blood. Get help right away if: You have sudden and severe pain in your abdomen. You have chest pain or difficulty breathing. You have any symptoms of a stroke. "BE FAST" is an easy way  to remember the main warning signs of a stroke: B - Balance. Signs are dizziness, sudden trouble walking, or loss of balance. E - Eyes. Signs are trouble seeing or a sudden change in vision. F - Face. Signs are sudden weakness or numbness of the face, or the face or eyelid drooping on one side. A - Arms. Signs are weakness or numbness in an arm. This happens suddenly and usually on one side of the body. S - Speech. Signs are sudden trouble speaking, slurred speech, or trouble understanding what people say. T - Time. Time to call emergency services. Write down what time symptoms started. You have other signs of a stroke, such as: A sudden, severe headache with no known cause. Nausea or vomiting. Seizure. These symptoms may be an emergency. Get help right away. Call 911. Do not wait to see if the symptoms will go away. Do not drive yourself to the hospital. Summary Polycythemia vera is a form of blood cancer in which the bone marrow makes too many red blood cells. People who have polycythemia vera are at a higher risk for stroke or heart attack because their blood may clot more easily. The disease is most often associated with an abnormal gene (genetic mutation) that causes changes in the way that the bone marrow makes blood cells. There is no cure for PV, but treatment can help to control the disease. It is treated by periodically having some blood removed and by taking medicines. This information is not intended to replace advice given to you by your health care provider. Make sure you discuss any questions you have with your health care provider. Document Revised: 11/21/2021 Document Reviewed: 11/21/2021 Elsevier Patient Education  2024 [employer].

## 2023-11-06 ENCOUNTER — Other Ambulatory Visit: Payer: Self-pay | Admitting: Nurse Practitioner

## 2023-11-06 LAB — CBC WITH DIFFERENTIAL/PLATELET
Absolute Lymphocytes: 2468 {cells}/uL (ref 850–3900)
Absolute Monocytes: 816 {cells}/uL (ref 200–950)
Basophils Absolute: 41 {cells}/uL (ref 0–200)
Basophils Relative: 0.6 %
Eosinophils Absolute: 156 {cells}/uL (ref 15–500)
Eosinophils Relative: 2.3 %
HCT: 50.3 % — ABNORMAL HIGH (ref 38.5–50.0)
Hemoglobin: 16.3 g/dL (ref 13.2–17.1)
MCH: 28.2 pg (ref 27.0–33.0)
MCHC: 32.4 g/dL (ref 32.0–36.0)
MCV: 87 fL (ref 80.0–100.0)
MPV: 9.5 fL (ref 7.5–12.5)
Monocytes Relative: 12 %
Neutro Abs: 3318 {cells}/uL (ref 1500–7800)
Neutrophils Relative %: 48.8 %
Platelets: 296 10*3/uL (ref 140–400)
RBC: 5.78 10*6/uL (ref 4.20–5.80)
RDW: 13.6 % (ref 11.0–15.0)
Total Lymphocyte: 36.3 %
WBC: 6.8 10*3/uL (ref 3.8–10.8)

## 2023-11-06 LAB — HEMOGLOBIN A1C
Hgb A1c MFr Bld: 10.4 %{Hb} — ABNORMAL HIGH (ref <5.7)
Mean Plasma Glucose: 252 mg/dL
eAG (mmol/L): 13.9 mmol/L

## 2023-11-06 LAB — COMPLETE METABOLIC PANEL WITH GFR
AG Ratio: 1.3 (calc) (ref 1.0–2.5)
ALT: 17 U/L (ref 9–46)
AST: 15 U/L (ref 10–35)
Albumin: 4.1 g/dL (ref 3.6–5.1)
Alkaline phosphatase (APISO): 64 U/L (ref 35–144)
BUN: 13 mg/dL (ref 7–25)
CO2: 29 mmol/L (ref 20–32)
Calcium: 9.6 mg/dL (ref 8.6–10.3)
Chloride: 97 mmol/L — ABNORMAL LOW (ref 98–110)
Creat: 1.09 mg/dL (ref 0.70–1.35)
Globulin: 3.1 g/dL (ref 1.9–3.7)
Glucose, Bld: 141 mg/dL — ABNORMAL HIGH (ref 65–99)
Potassium: 4.2 mmol/L (ref 3.5–5.3)
Sodium: 136 mmol/L (ref 135–146)
Total Bilirubin: 0.5 mg/dL (ref 0.2–1.2)
Total Protein: 7.2 g/dL (ref 6.1–8.1)
eGFR: 77 mL/min/{1.73_m2} (ref >=60)

## 2023-11-06 LAB — LIPID PANEL
Cholesterol: 140 mg/dL (ref <200)
HDL: 41 mg/dL (ref >=40)
LDL Cholesterol (Calc): 76 mg/dL
Non-HDL Cholesterol (Calc): 99 mg/dL (ref <130)
Total CHOL/HDL Ratio: 3.4 (calc) (ref <5.0)
Triglycerides: 144 mg/dL (ref <150)

## 2023-11-06 MED ORDER — TESTOSTERONE CYPIONATE 200 MG/ML IM SOLN: 200.0000 mg | INTRAMUSCULAR | 1 refills | Status: AC

## 2023-11-18 ENCOUNTER — Other Ambulatory Visit: Payer: Self-pay | Admitting: Nurse Practitioner

## 2023-12-13 ENCOUNTER — Telehealth: Payer: Self-pay | Admitting: Pharmacist

## 2023-12-13 NOTE — Progress Notes (Signed)
   12/13/2023  Patient ID: Russell [name], male   DOB: 12/17/1960, 63 y.o.   MRN: [id number]  Attempted to contact patient for scheduled appointment for medication management. Patient is on the [employer] Diabetes report. Left HIPAA compliant message for patient to return my call at their convenience.   Russell [name], PharmD, BCACP [facility] Clinical Pharmacist [phone]

## 2023-12-19 ENCOUNTER — Telehealth: Payer: Self-pay | Admitting: Pharmacist

## 2023-12-19 NOTE — Progress Notes (Signed)
   12/19/2023  Patient ID: Russell [name], male   DOB: 1961/04/01, 63 y.o.   MRN: [id number]  Attempted to contact patient for scheduled appointment for medication management. Patient is on the [employer] Diabetes report. Left HIPAA compliant message for patient to return my call at their convenience.   Today's call is the second attempt.   [name], PharmD, BCACP [facility] Clinical Pharmacist [phone]

## 2023-12-20 ENCOUNTER — Encounter: Payer: Self-pay | Admitting: Pharmacist

## 2023-12-20 ENCOUNTER — Other Ambulatory Visit: Payer: Self-pay | Admitting: Pharmacist

## 2023-12-20 ENCOUNTER — Other Ambulatory Visit: Payer: Self-pay | Admitting: Nurse Practitioner

## 2023-12-20 DIAGNOSIS — E1165 Type 2 diabetes mellitus with hyperglycemia: Secondary | ICD-10-CM

## 2023-12-20 DIAGNOSIS — E1122 Type 2 diabetes mellitus with diabetic chronic kidney disease: Secondary | ICD-10-CM

## 2023-12-20 MED ORDER — METFORMIN HCL ER 500 MG PO TB24: ORAL_TABLET | ORAL | 3 refills | Status: AC

## 2023-12-20 NOTE — Progress Notes (Signed)
 12/20/2023 Name: Russell [name] MRN: [id number] DOB: [birth year]  Chief Complaint  Patient presents with   Medication Management    Diabetes    Russell [name] II is a 63 y.o. year old male who presented for a telephone visit.   They were referred to the pharmacist by  [employer] [facility]  for assistance in managing diabetes.    Care Team: Primary Care Provider: Tonita Fallow, MD ; Next Scheduled Visit: 07/25   Medication Access/Adherence  Current Pharmacy:  [id number] - [place], [place] - [name] AT [facility] [name] [place] [address] Phone: [phone] Fax: [phone]  [id number] - [place], [place] - [address] AT [facility] [address] [place] [address] Phone: [phone] Fax: [phone]  [id number] - [place], [place] - [address] AT CORNER OF [facility] [address] [place] [address] Phone: [phone] Fax: [phone]  [facility] [place], [place] - [address] [place] [address] Phone: [phone] Fax: [phone]   Patient reports affordability concerns with their medications: No  Patient reports access/transportation concerns to their pharmacy: No  Patient reports adherence concerns with their medications:  No      Diabetes:  Current medications: Metformin , Mounjaro , Novolin  70/30   Patient reported taking a break on Metformin  due to constipation. He reported taking PRN stool softeners without a lot of success   Patient reported only using 17-18 units of Novolin  70/30 vs up to 50 units twice daily as prescribed.  Current glucose readings: 192 mg/dl Using Free Style Libre CGM  -Patient denied hypo/hyperglycemic symptoms  Current meal patterns:  - Breakfast: Lance Crackers/oatmeal - Lunch varied - Supper  Varied - Snacks varied - Drinks varied  Current physical activity: Works two jobs  Social:  Patient reported that [name] committed suicide and he had been having a hard time dealing with his feelings so had had begun drinking alcohol as a coping mechanism.  When asked about therapy or the need to speak with someone about his feelings, He said that He felt like He had turned the corner with how he was feeling because [name] was born 12/11/2023.  With a new why he said he felt encouraged to take better care of himself.     Objective:  Lab Results  Component Value Date   HGBA1C 10.4 (H) 11/05/2023    Lab Results  Component Value Date   CREATININE 1.09 11/05/2023   BUN 13 11/05/2023   NA 136 11/05/2023   K 4.2 11/05/2023   CL 97 (L) 11/05/2023   CO2 29 11/05/2023    Lab Results  Component Value Date   CHOL 140 11/05/2023   HDL 41 11/05/2023   LDLCALC 76 11/05/2023   TRIG 144 11/05/2023   CHOLHDL 3.4 11/05/2023    Medications Reviewed Today     Reviewed by Russell [name], RPH (Pharmacist) on 12/20/23 at 1057  Med List Status: <None>   Medication Order Taking? Sig Documenting Provider Last Dose Status Informant  acetaminophen  (TYLENOL ) 500 MG tablet [id number] No Take 500 mg by mouth every 6 (six) hours as needed.  Patient not taking: Reported on 12/20/2023   [provider] Not Taking Active   AMBULATORY NON FORMULARY MEDICATION [id number]  Diltiazem gel with 2% lidocaine  Apply a pea sized amount  into your rectum three times daily Dispense 30 GM zero refill [name], Russell SQUIBB, MD  Active   Russell [name] MEDICATION [id number]  Medication Name: Diltiazem with 2% Lidocaine compound  Apply to rectum 3 times daily as needed [name], Russell SQUIBB, MD  Active   amLODipine  (NORVASC ) 10 MG tablet [id number] Yes TAKE 1 TABLET DAILY FOR BLOOD PRESSURE [name], Kyra, NP Taking Active   aspirin EC 81 MG tablet [id number] Yes Take 81  mg by mouth daily. Swallow whole. [provider] Taking Active   atorvastatin  (LIPITOR) 40 MG tablet [id number] Yes Take 1 tab daily for cholesterol. Russell [name], Russell E, NP Taking Active   bisoprolol  (ZEBETA ) 10 MG tablet [id number] Yes TAKE 1 TABLET DAILY FOR BP, PATIENT KNOWS TO TAKE BY MOUTH [name], Tonya, NP Taking Active   ciclesonide (OMNARIS) 50 MCG/ACT nasal spray [id number] Yes Place 2 sprays into both nostrils daily as needed for allergies.  [provider] Taking Active Self  clotrimazole -betamethasone  (LOTRISONE ) cream [id number] Yes APPLY 3 TIMES A DAY AS NEEDED Russell [name], Russell E, NP Taking Active   Continuous Blood Gluc Sensor (FREESTYLE LIBRE 2 SENSOR) MISC [id number] Yes USE TO CHECK BLOOD SUGAR DAILY Russell [name], Russell E, NP Taking Active   Continuous Blood Gluc Sensor MISC [id number] Yes 1 each by Does not apply route as directed. Use as directed every 10 days. May dispense FreeStyle [employer] or similar. Russell Fallow, MD Taking Active   COVID-19 mRNA vaccine, Pfizer, (COMIRNATY ) syringe [id number]  Inject into the muscle. Luiz Channel, MD  Active   docusate sodium (COLACE) 100 MG capsule [id number] Yes Take 100 mg by mouth 2 (two) times daily as needed for mild constipation. [provider] Taking Active   ezetimibe  (ZETIA ) 10 MG tablet [id number] Yes TAKE 1 TABLET BY DAILY FOR CHOLESTEROL Russell [name], Russell E, NP Taking Active   glucose blood (FREESTYLE LITE) test strip [id number] Yes Check blood sugar 3 times a day or as directed. IK-Z88.77 Russell Fallow, MD Taking Active   indapamide (LOZOL) 1.25 MG tablet [id number] Yes Take 1.25 mg by mouth daily. [provider] Taking Active   insulin  NPH-regular Human (NOVOLIN  70/30) (70-30) 100 UNIT/ML injection [id number] Yes INJECT UP TO 50 UNITS 2 TIMES A DAY WITH MEALS FOR DIABETES Russell [name], Russell E, NP Taking Active            Med Note MACK, CASSIUS [name]   Fri Dec 20, 2023  8:39 AM) Using 17-18 units  twice dialy  Insulin  Syringe-Needle U-100 (TRUEPLUS INSULIN  SYRINGE) 31G X 5/16 0.5 ML MISC [id number] Yes USE TO INJECT INSULIN  TWICE DAILY AS DIRECTED [name], Tonya, NP Taking Active   ketoconazole  (NIZORAL ) 2 % shampoo [id number] Yes APPLY 1 APPLICATION TOPICALLY 2 (TWO) TIMES A WEEK. APPLY & SHAMPOO 2 X /WEEK Russell Fallow, MD Taking Active   metFORMIN  (GLUCOPHAGE -XR) 500 MG 24 hr tablet [id number]  TAKE 2 TABLETS TWICE DAILY WITH MEALS FOR DIABETES (DX - E11.9) [name], Tonya, NP  Active   pantoprazole  (PROTONIX ) 20 MG tablet [id number] Yes TAKE 1 TABLET(20 MG) BY MOUTH DAILY Russell [name], Russell E, NP Taking Active   SYRINGE-NEEDLE, DISP, 3 ML (SAFETY SYRINGE/NEEDLE) 21G X 1 3 ML MISC [id number] Yes Use to inject testosterone  into the skin weekly Laurice President, NP Taking Active   tamsulosin (FLOMAX) 0.4 MG CAPS capsule [id number] Yes Take 0.4 mg by mouth daily. [provider] Taking Active   telmisartan  (MICARDIS ) 40 MG tablet [id number] Yes TAKE 1 TABLET DAILY FOR BLOOD PRESSURE &  DIABETIC KIDNEY PROTECTION Russell [name], Russell E, NP Taking Active   testosterone  cypionate (DEPOTESTOSTERONE CYPIONATE) 200 MG/ML injection [id number] Yes Inject 1 mL (200 mg total) into the muscle every 14 (fourteen) days. Laurice President, NP Taking Active   tirzepatide  (MOUNJARO ) 12.5 MG/0.5ML Pen [id number] Yes Inject 12.5 mg into the skin once a week. Laurice President, NP Taking Active              Assessment/Plan:   Diabetes:  - Currently uncontrolled-  Patient reported taking a break from Metformin  due to constipation.  We discussed the importance of taking his medications as prescribed while also using other measures to prevent constipation like making sure he is drinking enough water to prevent dehydration (which can worsen constipation) as he is also on indapamide and taking a stool softener daily versus PRN since he is also on amlodipine  which can cause also cause constipation.  Patient also  reported only dosing Novolin  70/30 17-18 units twice daily based on what his blood sugars are prior to a meal versus the  up to 50 units twice daily that was prescribed.  As such, Patient received a surplus of insulin  in April of 2024 and did not need a refill since that time.    He said he had been instructed to never give himself insulin  if his blood sugar was 130mg /dl or less.  I told him I would speak with his provider about this because if his blood sugar is 130mg /dl prior to a meal and he eats, his blood sugar would elevate.  Since he is only dosing Novolin  70/30 based on his blood sugar and meals, he may be a candidate for a shorter acting insulin  with meals versus a combination product especially if he is compliant with Metformin  and Mounjaro  as well.    - Reviewed long term cardiovascular and renal outcomes of uncontrolled blood sugar - Reviewed goal A1c - Reviewed dietary modifications including high glycemic index foods, and carbohydrate counting - Reviewed lifestyle modifications including: - Recommend to take metformin  daily as prescribed, take a stool softener daily to combat constipation, eat a lower carb higher protein breakfast.   Follow Up Plan:  Will route a note to the Patient's PCP. Request refill of Metformin  be sent to [facility] (on [address]) Follow up with the patient in 2 weeks to see how his blood sugars are doing.    [name], PharmD, BCACP [facility] Clinical Pharmacist [phone]

## 2023-12-20 NOTE — Patient Instructions (Addendum)
 Mr. Fleta,  It was a pleasure to speak with you today.    Here is a summary of our discussion:  A refill request for Metformin  has been sent to your provider.  Your current HgA1c is 10.4%. To help lower this, it's important to stay consistent with your prescribed medications, especially Metformin .  To address constipation, we discussed increasing your water intake and incorporating a daily stool softener.  For dietary adjustments, aim to include lower-carb, higher-protein options for quick breakfasts, such as low-carb lactose-free protein drinks, low-carb protein bars, and egg whites.  I will follow up with you in two weeks to check on your progress.  Blessings,  [name], PharmD, BCACP Clinical Pharmacist [phone]

## 2023-12-31 ENCOUNTER — Encounter: Payer: Self-pay | Admitting: Nurse Practitioner

## 2023-12-31 ENCOUNTER — Other Ambulatory Visit: Payer: Self-pay | Admitting: Nurse Practitioner

## 2023-12-31 DIAGNOSIS — E1122 Type 2 diabetes mellitus with diabetic chronic kidney disease: Secondary | ICD-10-CM

## 2023-12-31 MED ORDER — FREESTYLE LIBRE 2 SENSOR MISC: 3 refills | Status: AC

## 2024-01-03 ENCOUNTER — Telehealth: Payer: Self-pay | Admitting: Pharmacist

## 2024-01-03 DIAGNOSIS — E1165 Type 2 diabetes mellitus with hyperglycemia: Secondary | ICD-10-CM

## 2024-01-03 NOTE — Progress Notes (Signed)
   01/03/2024  Patient ID: Russell [name], male   DOB: [birth year], 63 y.o.   MRN: [id number]    Reason for referral: Medication Management  Referral source:  [employer] [facility] Current insurance: [facility]  Reason for call: Diabetes follow up  Outreach:  Unsuccessful telephone call attempt #` to patient.   HIPAA compliant voicemail left requesting a return call  Plan:  -I will make another outreach attempt to patient in 1 week.Beecher Mcardle, PharmD, BCACP Clinical Pharmacist [phone]

## 2024-01-06 ENCOUNTER — Telehealth: Payer: Self-pay | Admitting: Pharmacist

## 2024-01-06 NOTE — Progress Notes (Signed)
   01/06/2024  Patient ID: Russell [name], male   DOB: [birth year], 63 y.o.   MRN: [id number]  [facility]) Quality Pharmacy Team  [facility]   01/06/2024  Russell [name] June 11, 1961 [id number]   Reason for referral: Medication Management  Referral source:  [employer] [facility] Current insurance: [facility]  Reason for call: Follow up on blood sugars  Outreach:  Unsuccessful telephone call attempt #2 to patient.   HIPAA compliant voicemail left requesting a return call  Patient sent me a MyChart message in response to my call stating the following:  "Yes, I was on a zoom @ the time. It coming back around slowly. Just had my reup of the 70/30 and Mounjaro. I'm beginning to see numbers in the 112-130 range "  Plan:  -I will make another outreach attempt to patient in 1 month.   Beecher Mcardle, PharmD, BCACP [facility] Clinical Pharmacist [phone]

## 2024-01-16 DIAGNOSIS — E119 Type 2 diabetes mellitus without complications: Secondary | ICD-10-CM | POA: Diagnosis not present

## 2024-01-16 DIAGNOSIS — I10 Essential (primary) hypertension: Secondary | ICD-10-CM | POA: Diagnosis not present

## 2024-01-27 DIAGNOSIS — J069 Acute upper respiratory infection, unspecified: Secondary | ICD-10-CM | POA: Diagnosis not present

## 2024-01-27 DIAGNOSIS — E119 Type 2 diabetes mellitus without complications: Secondary | ICD-10-CM | POA: Diagnosis not present

## 2024-01-27 DIAGNOSIS — R0981 Nasal congestion: Secondary | ICD-10-CM | POA: Diagnosis not present

## 2024-01-30 ENCOUNTER — Other Ambulatory Visit: Payer: Self-pay

## 2024-01-30 MED ORDER — MOUNJARO 12.5 MG/0.5ML ~~LOC~~ SOAJ: 12.5000 mg | SUBCUTANEOUS | 2 refills | Status: AC

## 2024-02-03 DIAGNOSIS — H35043 Retinal micro-aneurysms, unspecified, bilateral: Secondary | ICD-10-CM | POA: Diagnosis not present

## 2024-02-03 DIAGNOSIS — H11153 Pinguecula, bilateral: Secondary | ICD-10-CM | POA: Diagnosis not present

## 2024-02-03 DIAGNOSIS — H2513 Age-related nuclear cataract, bilateral: Secondary | ICD-10-CM | POA: Diagnosis not present

## 2024-02-03 DIAGNOSIS — E113293 Type 2 diabetes mellitus with mild nonproliferative diabetic retinopathy without macular edema, bilateral: Secondary | ICD-10-CM | POA: Diagnosis not present

## 2024-02-26 DIAGNOSIS — E291 Testicular hypofunction: Secondary | ICD-10-CM | POA: Diagnosis not present

## 2024-02-26 DIAGNOSIS — N401 Enlarged prostate with lower urinary tract symptoms: Secondary | ICD-10-CM | POA: Diagnosis not present

## 2024-02-26 DIAGNOSIS — R972 Elevated prostate specific antigen [PSA]: Secondary | ICD-10-CM | POA: Diagnosis not present

## 2024-02-26 DIAGNOSIS — R351 Nocturia: Secondary | ICD-10-CM | POA: Diagnosis not present

## 2024-02-26 DIAGNOSIS — N5201 Erectile dysfunction due to arterial insufficiency: Secondary | ICD-10-CM | POA: Diagnosis not present

## 2024-05-18 ENCOUNTER — Other Ambulatory Visit: Payer: Self-pay | Admitting: Family

## 2024-06-02 DIAGNOSIS — Z794 Long term (current) use of insulin: Secondary | ICD-10-CM | POA: Diagnosis not present

## 2024-06-02 DIAGNOSIS — E113293 Type 2 diabetes mellitus with mild nonproliferative diabetic retinopathy without macular edema, bilateral: Secondary | ICD-10-CM | POA: Diagnosis not present

## 2024-06-10 DIAGNOSIS — M722 Plantar fascial fibromatosis: Secondary | ICD-10-CM | POA: Diagnosis not present

## 2024-06-10 DIAGNOSIS — R300 Dysuria: Secondary | ICD-10-CM | POA: Diagnosis not present

## 2024-06-10 DIAGNOSIS — M6701 Short Achilles tendon (acquired), right ankle: Secondary | ICD-10-CM | POA: Diagnosis not present

## 2024-06-24 ENCOUNTER — Encounter: Payer: BC Managed Care – PPO | Admitting: Nurse Practitioner

## 2024-07-08 ENCOUNTER — Encounter: Payer: BC Managed Care – PPO | Admitting: Nurse Practitioner

## 2024-08-05 DIAGNOSIS — M6701 Short Achilles tendon (acquired), right ankle: Secondary | ICD-10-CM | POA: Diagnosis not present

## 2024-08-05 DIAGNOSIS — M722 Plantar fascial fibromatosis: Secondary | ICD-10-CM | POA: Diagnosis not present

## 2024-08-20 DIAGNOSIS — Z794 Long term (current) use of insulin: Secondary | ICD-10-CM | POA: Diagnosis not present

## 2024-08-20 DIAGNOSIS — E113293 Type 2 diabetes mellitus with mild nonproliferative diabetic retinopathy without macular edema, bilateral: Secondary | ICD-10-CM | POA: Diagnosis not present

## 2024-08-20 DIAGNOSIS — H8110 Benign paroxysmal vertigo, unspecified ear: Secondary | ICD-10-CM | POA: Diagnosis not present

## 2024-08-20 DIAGNOSIS — I10 Essential (primary) hypertension: Secondary | ICD-10-CM | POA: Diagnosis not present

## 2024-08-20 DIAGNOSIS — Z6835 Body mass index (BMI) 35.0-35.9, adult: Secondary | ICD-10-CM | POA: Diagnosis not present

## 2024-08-20 DIAGNOSIS — R42 Dizziness and giddiness: Secondary | ICD-10-CM | POA: Diagnosis not present

## 2024-08-24 DIAGNOSIS — E291 Testicular hypofunction: Secondary | ICD-10-CM | POA: Diagnosis not present

## 2024-08-31 DIAGNOSIS — R351 Nocturia: Secondary | ICD-10-CM | POA: Diagnosis not present

## 2024-08-31 DIAGNOSIS — N5201 Erectile dysfunction due to arterial insufficiency: Secondary | ICD-10-CM | POA: Diagnosis not present

## 2024-08-31 DIAGNOSIS — E291 Testicular hypofunction: Secondary | ICD-10-CM | POA: Diagnosis not present

## 2024-08-31 DIAGNOSIS — R3912 Poor urinary stream: Secondary | ICD-10-CM | POA: Diagnosis not present

## 2024-08-31 DIAGNOSIS — N401 Enlarged prostate with lower urinary tract symptoms: Secondary | ICD-10-CM | POA: Diagnosis not present

## 2024-09-25 DIAGNOSIS — Z23 Encounter for immunization: Secondary | ICD-10-CM | POA: Diagnosis not present

## 2024-10-05 DIAGNOSIS — M6701 Short Achilles tendon (acquired), right ankle: Secondary | ICD-10-CM | POA: Diagnosis not present

## 2024-10-05 DIAGNOSIS — M722 Plantar fascial fibromatosis: Secondary | ICD-10-CM | POA: Diagnosis not present
